# Patient Record
Sex: Female | Born: 1935
Health system: Southern US, Community
[De-identification: ages and names within clinical notes are randomized; demographics above are authoritative.]

## PROBLEM LIST (undated history)

## (undated) DIAGNOSIS — E559 Vitamin D deficiency, unspecified: Secondary | ICD-10-CM

## (undated) DIAGNOSIS — K5792 Diverticulitis of intestine, part unspecified, without perforation or abscess without bleeding: Secondary | ICD-10-CM

## (undated) DIAGNOSIS — N8110 Cystocele, unspecified: Secondary | ICD-10-CM

## (undated) DIAGNOSIS — E782 Mixed hyperlipidemia: Secondary | ICD-10-CM

## (undated) DIAGNOSIS — M810 Age-related osteoporosis without current pathological fracture: Secondary | ICD-10-CM

## (undated) DIAGNOSIS — M1990 Unspecified osteoarthritis, unspecified site: Secondary | ICD-10-CM

## (undated) DIAGNOSIS — K5732 Diverticulitis of large intestine without perforation or abscess without bleeding: Secondary | ICD-10-CM

## (undated) DIAGNOSIS — I639 Cerebral infarction, unspecified: Secondary | ICD-10-CM

## (undated) DIAGNOSIS — E079 Disorder of thyroid, unspecified: Secondary | ICD-10-CM

## (undated) DIAGNOSIS — Z800 Family history of malignant neoplasm of digestive organs: Secondary | ICD-10-CM

## (undated) DIAGNOSIS — K8020 Calculus of gallbladder without cholecystitis without obstruction: Secondary | ICD-10-CM

## (undated) DIAGNOSIS — E034 Atrophy of thyroid (acquired): Secondary | ICD-10-CM

## (undated) DIAGNOSIS — G43009 Migraine without aura, not intractable, without status migrainosus: Secondary | ICD-10-CM

## (undated) DIAGNOSIS — A498 Other bacterial infections of unspecified site: Secondary | ICD-10-CM

## (undated) DIAGNOSIS — N390 Urinary tract infection, site not specified: Secondary | ICD-10-CM

## (undated) HISTORY — DX: Calculus of gallbladder without cholecystitis without obstruction: K80.20

## (undated) HISTORY — DX: Atrophy of thyroid (acquired): E03.4

## (undated) HISTORY — PX: APPENDECTOMY: SHX54

## (undated) HISTORY — PX: ABDOMINAL HYSTERECTOMY: SHX81

## (undated) HISTORY — DX: Urinary tract infection, site not specified: N39.0

## (undated) HISTORY — DX: Mixed hyperlipidemia: E78.2

## (undated) HISTORY — DX: Migraine without aura, not intractable, without status migrainosus: G43.009

## (undated) HISTORY — DX: Family history of malignant neoplasm of digestive organs: Z80.0

## (undated) HISTORY — PX: OOPHORECTOMY: SHX86

## (undated) HISTORY — DX: Diverticulitis of large intestine without perforation or abscess without bleeding: K57.32

## (undated) HISTORY — DX: Other bacterial infections of unspecified site: A49.8

## (undated) HISTORY — DX: Age-related osteoporosis without current pathological fracture: M81.0

## (undated) HISTORY — DX: Cystocele, unspecified: N81.10

## (undated) HISTORY — PX: CATARACT EXTRACTION, BILATERAL: SHX1313

## (undated) HISTORY — DX: Vitamin D deficiency, unspecified: E55.9

## (undated) HISTORY — DX: Unspecified osteoarthritis, unspecified site: M19.90

---

## 1967-02-23 HISTORY — PX: APPENDECTOMY: SHX54

## 2009-03-25 LAB — HM COLONOSCOPY

## 2009-05-12 HISTORY — PX: COLONOSCOPY: SHX174

## 2011-03-01 DIAGNOSIS — R339 Retention of urine, unspecified: Secondary | ICD-10-CM | POA: Diagnosis not present

## 2011-03-01 DIAGNOSIS — N8110 Cystocele, unspecified: Secondary | ICD-10-CM | POA: Diagnosis not present

## 2011-04-09 DIAGNOSIS — E038 Other specified hypothyroidism: Secondary | ICD-10-CM | POA: Diagnosis not present

## 2011-04-09 DIAGNOSIS — M810 Age-related osteoporosis without current pathological fracture: Secondary | ICD-10-CM | POA: Diagnosis not present

## 2011-04-09 DIAGNOSIS — E559 Vitamin D deficiency, unspecified: Secondary | ICD-10-CM | POA: Diagnosis not present

## 2011-04-09 DIAGNOSIS — E782 Mixed hyperlipidemia: Secondary | ICD-10-CM | POA: Diagnosis not present

## 2011-04-12 DIAGNOSIS — N8110 Cystocele, unspecified: Secondary | ICD-10-CM | POA: Diagnosis not present

## 2011-04-12 DIAGNOSIS — R339 Retention of urine, unspecified: Secondary | ICD-10-CM | POA: Diagnosis not present

## 2011-06-15 DIAGNOSIS — E038 Other specified hypothyroidism: Secondary | ICD-10-CM | POA: Diagnosis not present

## 2011-07-12 DIAGNOSIS — R339 Retention of urine, unspecified: Secondary | ICD-10-CM | POA: Diagnosis not present

## 2011-07-12 DIAGNOSIS — N8110 Cystocele, unspecified: Secondary | ICD-10-CM | POA: Diagnosis not present

## 2011-10-05 DIAGNOSIS — E782 Mixed hyperlipidemia: Secondary | ICD-10-CM | POA: Diagnosis not present

## 2011-10-05 DIAGNOSIS — M810 Age-related osteoporosis without current pathological fracture: Secondary | ICD-10-CM | POA: Diagnosis not present

## 2011-10-05 DIAGNOSIS — Z1231 Encounter for screening mammogram for malignant neoplasm of breast: Secondary | ICD-10-CM | POA: Diagnosis not present

## 2011-10-05 DIAGNOSIS — E559 Vitamin D deficiency, unspecified: Secondary | ICD-10-CM | POA: Diagnosis not present

## 2011-10-05 DIAGNOSIS — E038 Other specified hypothyroidism: Secondary | ICD-10-CM | POA: Diagnosis not present

## 2011-10-18 DIAGNOSIS — N8110 Cystocele, unspecified: Secondary | ICD-10-CM | POA: Diagnosis not present

## 2011-10-18 DIAGNOSIS — R339 Retention of urine, unspecified: Secondary | ICD-10-CM | POA: Diagnosis not present

## 2011-11-08 DIAGNOSIS — Z1231 Encounter for screening mammogram for malignant neoplasm of breast: Secondary | ICD-10-CM | POA: Diagnosis not present

## 2012-01-17 DIAGNOSIS — R339 Retention of urine, unspecified: Secondary | ICD-10-CM | POA: Diagnosis not present

## 2012-01-17 DIAGNOSIS — N8110 Cystocele, unspecified: Secondary | ICD-10-CM | POA: Diagnosis not present

## 2012-01-31 DIAGNOSIS — Z23 Encounter for immunization: Secondary | ICD-10-CM | POA: Diagnosis not present

## 2012-04-24 DIAGNOSIS — R339 Retention of urine, unspecified: Secondary | ICD-10-CM | POA: Diagnosis not present

## 2012-04-24 DIAGNOSIS — N8110 Cystocele, unspecified: Secondary | ICD-10-CM | POA: Diagnosis not present

## 2012-04-26 DIAGNOSIS — E559 Vitamin D deficiency, unspecified: Secondary | ICD-10-CM | POA: Diagnosis not present

## 2012-04-26 DIAGNOSIS — E782 Mixed hyperlipidemia: Secondary | ICD-10-CM | POA: Diagnosis not present

## 2012-04-26 DIAGNOSIS — E038 Other specified hypothyroidism: Secondary | ICD-10-CM | POA: Diagnosis not present

## 2012-06-01 DIAGNOSIS — B029 Zoster without complications: Secondary | ICD-10-CM | POA: Diagnosis not present

## 2012-07-31 DIAGNOSIS — N8110 Cystocele, unspecified: Secondary | ICD-10-CM | POA: Diagnosis not present

## 2012-07-31 DIAGNOSIS — R339 Retention of urine, unspecified: Secondary | ICD-10-CM | POA: Diagnosis not present

## 2012-09-04 DIAGNOSIS — H2510 Age-related nuclear cataract, unspecified eye: Secondary | ICD-10-CM | POA: Diagnosis not present

## 2012-10-25 DIAGNOSIS — Z1231 Encounter for screening mammogram for malignant neoplasm of breast: Secondary | ICD-10-CM | POA: Diagnosis not present

## 2012-10-25 DIAGNOSIS — E038 Other specified hypothyroidism: Secondary | ICD-10-CM | POA: Diagnosis not present

## 2012-10-25 DIAGNOSIS — Z23 Encounter for immunization: Secondary | ICD-10-CM | POA: Diagnosis not present

## 2012-10-25 DIAGNOSIS — E782 Mixed hyperlipidemia: Secondary | ICD-10-CM | POA: Diagnosis not present

## 2012-11-06 DIAGNOSIS — N952 Postmenopausal atrophic vaginitis: Secondary | ICD-10-CM | POA: Diagnosis not present

## 2012-11-06 DIAGNOSIS — N8110 Cystocele, unspecified: Secondary | ICD-10-CM | POA: Diagnosis not present

## 2012-11-08 DIAGNOSIS — Z1231 Encounter for screening mammogram for malignant neoplasm of breast: Secondary | ICD-10-CM | POA: Diagnosis not present

## 2013-01-09 DIAGNOSIS — Z0000 Encounter for general adult medical examination without abnormal findings: Secondary | ICD-10-CM | POA: Diagnosis not present

## 2013-02-05 DIAGNOSIS — N8110 Cystocele, unspecified: Secondary | ICD-10-CM | POA: Diagnosis not present

## 2013-02-05 DIAGNOSIS — R339 Retention of urine, unspecified: Secondary | ICD-10-CM | POA: Diagnosis not present

## 2013-02-05 DIAGNOSIS — N952 Postmenopausal atrophic vaginitis: Secondary | ICD-10-CM | POA: Diagnosis not present

## 2013-03-22 DIAGNOSIS — R7401 Elevation of levels of liver transaminase levels: Secondary | ICD-10-CM | POA: Diagnosis not present

## 2013-04-24 DIAGNOSIS — Z111 Encounter for screening for respiratory tuberculosis: Secondary | ICD-10-CM | POA: Diagnosis not present

## 2013-05-07 DIAGNOSIS — N8110 Cystocele, unspecified: Secondary | ICD-10-CM | POA: Diagnosis not present

## 2013-05-07 DIAGNOSIS — N952 Postmenopausal atrophic vaginitis: Secondary | ICD-10-CM | POA: Diagnosis not present

## 2013-05-10 DIAGNOSIS — E782 Mixed hyperlipidemia: Secondary | ICD-10-CM | POA: Diagnosis not present

## 2013-05-10 DIAGNOSIS — E559 Vitamin D deficiency, unspecified: Secondary | ICD-10-CM | POA: Diagnosis not present

## 2013-05-10 DIAGNOSIS — E038 Other specified hypothyroidism: Secondary | ICD-10-CM | POA: Diagnosis not present

## 2013-05-10 DIAGNOSIS — M810 Age-related osteoporosis without current pathological fracture: Secondary | ICD-10-CM | POA: Diagnosis not present

## 2013-05-18 DIAGNOSIS — H5710 Ocular pain, unspecified eye: Secondary | ICD-10-CM | POA: Diagnosis not present

## 2013-08-07 DIAGNOSIS — N8110 Cystocele, unspecified: Secondary | ICD-10-CM | POA: Diagnosis not present

## 2013-08-07 DIAGNOSIS — R339 Retention of urine, unspecified: Secondary | ICD-10-CM | POA: Diagnosis not present

## 2013-11-07 DIAGNOSIS — R339 Retention of urine, unspecified: Secondary | ICD-10-CM | POA: Diagnosis not present

## 2013-11-07 DIAGNOSIS — N8110 Cystocele, unspecified: Secondary | ICD-10-CM | POA: Diagnosis not present

## 2013-11-15 DIAGNOSIS — E559 Vitamin D deficiency, unspecified: Secondary | ICD-10-CM | POA: Diagnosis not present

## 2013-11-15 DIAGNOSIS — E782 Mixed hyperlipidemia: Secondary | ICD-10-CM | POA: Diagnosis not present

## 2013-11-15 DIAGNOSIS — E038 Other specified hypothyroidism: Secondary | ICD-10-CM | POA: Diagnosis not present

## 2013-11-19 DIAGNOSIS — E559 Vitamin D deficiency, unspecified: Secondary | ICD-10-CM | POA: Diagnosis not present

## 2013-11-19 DIAGNOSIS — Z1231 Encounter for screening mammogram for malignant neoplasm of breast: Secondary | ICD-10-CM | POA: Diagnosis not present

## 2013-11-19 DIAGNOSIS — Z23 Encounter for immunization: Secondary | ICD-10-CM | POA: Diagnosis not present

## 2013-11-19 DIAGNOSIS — M810 Age-related osteoporosis without current pathological fracture: Secondary | ICD-10-CM | POA: Diagnosis not present

## 2013-11-19 DIAGNOSIS — E782 Mixed hyperlipidemia: Secondary | ICD-10-CM | POA: Diagnosis not present

## 2013-11-19 DIAGNOSIS — M25519 Pain in unspecified shoulder: Secondary | ICD-10-CM | POA: Diagnosis not present

## 2013-11-19 DIAGNOSIS — E038 Other specified hypothyroidism: Secondary | ICD-10-CM | POA: Diagnosis not present

## 2013-12-19 DIAGNOSIS — M810 Age-related osteoporosis without current pathological fracture: Secondary | ICD-10-CM | POA: Diagnosis not present

## 2013-12-19 DIAGNOSIS — Z1231 Encounter for screening mammogram for malignant neoplasm of breast: Secondary | ICD-10-CM | POA: Diagnosis not present

## 2014-02-06 DIAGNOSIS — N8110 Cystocele, unspecified: Secondary | ICD-10-CM | POA: Diagnosis not present

## 2014-02-06 DIAGNOSIS — R339 Retention of urine, unspecified: Secondary | ICD-10-CM | POA: Diagnosis not present

## 2014-02-06 DIAGNOSIS — K5900 Constipation, unspecified: Secondary | ICD-10-CM | POA: Diagnosis not present

## 2014-05-08 DIAGNOSIS — R339 Retention of urine, unspecified: Secondary | ICD-10-CM | POA: Diagnosis not present

## 2014-05-08 DIAGNOSIS — N952 Postmenopausal atrophic vaginitis: Secondary | ICD-10-CM | POA: Diagnosis not present

## 2014-05-08 DIAGNOSIS — N8110 Cystocele, unspecified: Secondary | ICD-10-CM | POA: Diagnosis not present

## 2014-05-20 DIAGNOSIS — E782 Mixed hyperlipidemia: Secondary | ICD-10-CM | POA: Diagnosis not present

## 2014-05-20 DIAGNOSIS — F5101 Primary insomnia: Secondary | ICD-10-CM | POA: Diagnosis not present

## 2014-05-20 DIAGNOSIS — E034 Atrophy of thyroid (acquired): Secondary | ICD-10-CM | POA: Diagnosis not present

## 2014-05-23 DIAGNOSIS — E875 Hyperkalemia: Secondary | ICD-10-CM | POA: Diagnosis not present

## 2014-08-20 DIAGNOSIS — N390 Urinary tract infection, site not specified: Secondary | ICD-10-CM | POA: Diagnosis not present

## 2014-08-20 DIAGNOSIS — R339 Retention of urine, unspecified: Secondary | ICD-10-CM | POA: Diagnosis not present

## 2014-08-20 DIAGNOSIS — N8110 Cystocele, unspecified: Secondary | ICD-10-CM | POA: Diagnosis not present

## 2014-08-20 DIAGNOSIS — N3090 Cystitis, unspecified without hematuria: Secondary | ICD-10-CM | POA: Diagnosis not present

## 2014-11-18 DIAGNOSIS — Z1231 Encounter for screening mammogram for malignant neoplasm of breast: Secondary | ICD-10-CM | POA: Diagnosis not present

## 2014-11-18 DIAGNOSIS — E782 Mixed hyperlipidemia: Secondary | ICD-10-CM | POA: Diagnosis not present

## 2014-11-18 DIAGNOSIS — E034 Atrophy of thyroid (acquired): Secondary | ICD-10-CM | POA: Diagnosis not present

## 2014-11-18 DIAGNOSIS — M810 Age-related osteoporosis without current pathological fracture: Secondary | ICD-10-CM | POA: Diagnosis not present

## 2014-11-18 DIAGNOSIS — Z23 Encounter for immunization: Secondary | ICD-10-CM | POA: Diagnosis not present

## 2014-11-27 DIAGNOSIS — N8110 Cystocele, unspecified: Secondary | ICD-10-CM | POA: Diagnosis not present

## 2014-11-27 DIAGNOSIS — R339 Retention of urine, unspecified: Secondary | ICD-10-CM | POA: Diagnosis not present

## 2014-12-23 DIAGNOSIS — Z1231 Encounter for screening mammogram for malignant neoplasm of breast: Secondary | ICD-10-CM | POA: Diagnosis not present

## 2014-12-26 DIAGNOSIS — Z0000 Encounter for general adult medical examination without abnormal findings: Secondary | ICD-10-CM | POA: Diagnosis not present

## 2014-12-26 DIAGNOSIS — Z6822 Body mass index (BMI) 22.0-22.9, adult: Secondary | ICD-10-CM | POA: Diagnosis not present

## 2015-03-03 DIAGNOSIS — N8110 Cystocele, unspecified: Secondary | ICD-10-CM | POA: Diagnosis not present

## 2015-03-03 DIAGNOSIS — R339 Retention of urine, unspecified: Secondary | ICD-10-CM | POA: Diagnosis not present

## 2015-05-19 DIAGNOSIS — E782 Mixed hyperlipidemia: Secondary | ICD-10-CM | POA: Diagnosis not present

## 2015-05-19 DIAGNOSIS — E034 Atrophy of thyroid (acquired): Secondary | ICD-10-CM | POA: Diagnosis not present

## 2015-05-19 DIAGNOSIS — R945 Abnormal results of liver function studies: Secondary | ICD-10-CM | POA: Diagnosis not present

## 2015-05-19 DIAGNOSIS — G43009 Migraine without aura, not intractable, without status migrainosus: Secondary | ICD-10-CM | POA: Diagnosis not present

## 2015-06-17 DIAGNOSIS — N952 Postmenopausal atrophic vaginitis: Secondary | ICD-10-CM | POA: Diagnosis not present

## 2015-06-17 DIAGNOSIS — N8110 Cystocele, unspecified: Secondary | ICD-10-CM | POA: Diagnosis not present

## 2015-07-03 DIAGNOSIS — R740 Nonspecific elevation of levels of transaminase and lactic acid dehydrogenase [LDH]: Secondary | ICD-10-CM | POA: Diagnosis not present

## 2015-09-23 DIAGNOSIS — N3289 Other specified disorders of bladder: Secondary | ICD-10-CM | POA: Diagnosis not present

## 2015-09-23 DIAGNOSIS — N952 Postmenopausal atrophic vaginitis: Secondary | ICD-10-CM | POA: Diagnosis not present

## 2015-09-23 DIAGNOSIS — N8110 Cystocele, unspecified: Secondary | ICD-10-CM | POA: Diagnosis not present

## 2015-11-18 DIAGNOSIS — Z1231 Encounter for screening mammogram for malignant neoplasm of breast: Secondary | ICD-10-CM | POA: Diagnosis not present

## 2015-11-18 DIAGNOSIS — E782 Mixed hyperlipidemia: Secondary | ICD-10-CM | POA: Diagnosis not present

## 2015-11-18 DIAGNOSIS — M810 Age-related osteoporosis without current pathological fracture: Secondary | ICD-10-CM | POA: Diagnosis not present

## 2015-11-18 DIAGNOSIS — E034 Atrophy of thyroid (acquired): Secondary | ICD-10-CM | POA: Diagnosis not present

## 2015-12-20 ENCOUNTER — Encounter (HOSPITAL_BASED_OUTPATIENT_CLINIC_OR_DEPARTMENT_OTHER): Payer: Self-pay | Admitting: Emergency Medicine

## 2015-12-20 ENCOUNTER — Emergency Department (HOSPITAL_BASED_OUTPATIENT_CLINIC_OR_DEPARTMENT_OTHER)
Admission: EM | Admit: 2015-12-20 | Discharge: 2015-12-20 | Disposition: A | Discharge status: Home / Self Care | Payer: Medicare Other | Attending: Emergency Medicine | Admitting: Emergency Medicine

## 2015-12-20 DIAGNOSIS — X58XXXA Exposure to other specified factors, initial encounter: Secondary | ICD-10-CM | POA: Diagnosis not present

## 2015-12-20 DIAGNOSIS — T18108A Unspecified foreign body in esophagus causing other injury, initial encounter: Secondary | ICD-10-CM | POA: Diagnosis present

## 2015-12-20 DIAGNOSIS — T18128A Food in esophagus causing other injury, initial encounter: Secondary | ICD-10-CM

## 2015-12-20 DIAGNOSIS — Y929 Unspecified place or not applicable: Secondary | ICD-10-CM | POA: Insufficient documentation

## 2015-12-20 DIAGNOSIS — K222 Esophageal obstruction: Secondary | ICD-10-CM | POA: Diagnosis not present

## 2015-12-20 DIAGNOSIS — Z87891 Personal history of nicotine dependence: Secondary | ICD-10-CM | POA: Insufficient documentation

## 2015-12-20 DIAGNOSIS — Y939 Activity, unspecified: Secondary | ICD-10-CM | POA: Insufficient documentation

## 2015-12-20 DIAGNOSIS — Z7982 Long term (current) use of aspirin: Secondary | ICD-10-CM | POA: Insufficient documentation

## 2015-12-20 DIAGNOSIS — Z79899 Other long term (current) drug therapy: Secondary | ICD-10-CM | POA: Diagnosis not present

## 2015-12-20 DIAGNOSIS — Y999 Unspecified external cause status: Secondary | ICD-10-CM | POA: Diagnosis not present

## 2015-12-20 DIAGNOSIS — T18120A Food in esophagus causing compression of trachea, initial encounter: Secondary | ICD-10-CM | POA: Diagnosis not present

## 2015-12-20 HISTORY — DX: Disorder of thyroid, unspecified: E07.9

## 2015-12-20 HISTORY — DX: Cerebral infarction, unspecified: I63.9

## 2015-12-20 MED ORDER — NITROGLYCERIN 0.4 MG SL SUBL
0.4000 mg | SUBLINGUAL_TABLET | Freq: Once | SUBLINGUAL | Status: AC
  Administered 2015-12-20: 0.4 mg via SUBLINGUAL
  Filled 2015-12-20: qty 1

## 2015-12-20 NOTE — ED Notes (Signed)
MD at bedside. 

## 2015-12-20 NOTE — ED Provider Notes (Signed)
[place] [facility] Provider Note   CSN: [id number] Arrival date & time: 12/20/15  0859     History   Chief Complaint Chief Complaint  Patient presents with  . Choking    HPI Claudia Rasmussen is a 82 y.o. female.  82 yo F with a chief complaint of a esophageal foreign body. Patient had steak last night about 5:30 and since then has been unable to swallow anything. Denies any shortness of breath difficulty talking. Patient has tried to drink fluids since then and has not failed to tolerate anything. She even has to stop her own saliva. His never had an issue like this before. Has never seen a gastroenterologist.   The history is provided by the patient.  Swallowed Foreign Body  This is a new problem. The current episode started yesterday. The problem occurs constantly. The problem has not changed since onset.Pertinent negatives include no chest pain, no headaches and no shortness of breath. Nothing aggravates the symptoms. Nothing relieves the symptoms. She has tried water for the symptoms. The treatment provided no relief.    Past Medical History:  Diagnosis Date  . Stroke ([place])   . Thyroid disease     There are no active problems to display for this patient.   Past Surgical History:  Procedure Laterality Date  . ABDOMINAL HYSTERECTOMY      OB History    No data available       Home Medications    Prior to Admission medications   Medication Sig Start Date End Date Taking? Authorizing Provider  alendronate (FOSAMAX) 70 MG tablet Take 70 mg by mouth once a week. Take with a full glass of water on an empty stomach.   Yes Historical Provider, MD  aspirin EC 81 MG tablet Take 81 mg by mouth daily.   Yes Historical Provider, MD  clopidogrel (PLAVIX) 75 MG tablet Take 75 mg by mouth daily.   Yes Historical Provider, MD  levothyroxine (SYNTHROID, LEVOTHROID) 75 MCG tablet Take 75 mcg by mouth daily before breakfast.   Yes Historical Provider, MD  nadolol (CORGARD)  20 MG tablet Take 20 mg by mouth daily.   Yes Historical Provider, MD  simvastatin (ZOCOR) 10 MG tablet Take 10 mg by mouth daily.   Yes Historical Provider, MD    Family History No family history on file.  Social History Social History  Substance Use Topics  . Smoking status: Former Research scientist (life sciences)  . Smokeless tobacco: Never Used  . Alcohol use Yes     Allergies   Avelox [moxifloxacin hcl in nacl]; Cephalosporins; Phenobarbital; and Zithromax [azithromycin]   Review of Systems Review of Systems  Constitutional: Negative for chills and fever.  HENT: Negative for congestion and rhinorrhea.   Eyes: Negative for redness and visual disturbance.  Respiratory: Negative for shortness of breath and wheezing.   Cardiovascular: Negative for chest pain and palpitations.  Gastrointestinal: Negative for nausea and vomiting.       Unable to swallow   Genitourinary: Negative for dysuria and urgency.  Musculoskeletal: Negative for arthralgias and myalgias.  Skin: Negative for pallor and wound.  Neurological: Negative for dizziness and headaches.     Physical Exam Updated Vital Signs BP 138/88 (BP Location: Left Arm)   Pulse 80   Temp 97.9 F (36.6 C) (Oral)   Resp 18   Ht 5\' 3"  (1.6 m)   Wt 127 lb (57.6 kg)   SpO2 94%   BMI 22.50 kg/m   Physical Exam  Constitutional:  She is oriented to person, place, and time. She appears well-developed and well-nourished. No distress.  HENT:  Head: Normocephalic and atraumatic.  Posterior nasal drip. No pooling of secretions in the oropharynx.  Eyes: EOM are normal. Pupils are equal, round, and reactive to light.  Neck: Normal range of motion. Neck supple.  Cardiovascular: Normal rate and regular rhythm.  Exam reveals no gallop and no friction rub.   No murmur heard. Pulmonary/Chest: Effort normal. She has no wheezes. She has no rales.  Abdominal: Soft. She exhibits no distension and no mass. There is no tenderness. There is no guarding.    Musculoskeletal: She exhibits no edema or tenderness.  Neurological: She is alert and oriented to person, place, and time.  Skin: Skin is warm and dry. She is not diaphoretic.  Psychiatric: She has a normal mood and affect. Her behavior is normal.  Nursing note and vitals reviewed.    ED Treatments / Results  Labs (all labs ordered are listed, but only abnormal results are displayed) Labs Reviewed - No data to display  EKG  EKG Interpretation None       Radiology No results found.  Procedures Procedures (including critical care time)  Medications Ordered in ED Medications  nitroGLYCERIN (NITROSTAT) SL tablet 0.4 mg (not administered)     Initial Impression / Assessment and Plan / ED Course  I have reviewed the triage vital signs and the nursing notes.  Pertinent labs & imaging results that were available during my care of the patient were reviewed by me and considered in my medical decision making (see chart for details).  Clinical Course    82 yo F With a chief complaint of difficulty swallowing. This been going on since yesterday. She is able to tolerate her own secretions. We'll discuss with GI.   Patient able to swallow prior to discussion with GI, requesting D/c.  Given GI follow up for possible stricture.  9:48 AM:  I have discussed the diagnosis/risks/treatment options with the patient and family and believe the pt to be eligible for discharge home to follow-up with GI. We also discussed returning to the ED immediately if new or worsening sx occur. We discussed the sx which are most concerning (e.g., inability to swallow liquids or solids, difficulty breathing) that necessitate immediate return. Medications administered to the patient during their visit and any new prescriptions provided to the patient are listed below.  Medications given during this visit Medications  nitroGLYCERIN (NITROSTAT) SL tablet 0.4 mg (0.4 mg Sublingual Given 12/20/15 0934)      The patient appears reasonably screen and/or stabilized for discharge and I doubt any other medical condition or other [facility] requiring further screening, evaluation, or treatment in the ED at this time prior to discharge.    Final Clinical Impressions(s) / ED Diagnoses   Final diagnoses:  None    New Prescriptions New Prescriptions   No medications on file     Deno Etienne, DO 12/20/15 [id number]

## 2015-12-20 NOTE — ED Triage Notes (Signed)
Pt reports she choked on a piece of steak last night and has since been unable to swallow water or her saliva. Pt feels like it is stuck in her esophagus.

## 2015-12-20 NOTE — Discharge Instructions (Signed)
Soft diet.  Return to [facility] [place] long ED if you go home and are unable to swallow.  Most often this is caused by a stricture, you may need to see GI for this.  If you continue having symptoms but can swallow liquids call the GI doctor first thing on Monday.

## 2015-12-26 DIAGNOSIS — Z1231 Encounter for screening mammogram for malignant neoplasm of breast: Secondary | ICD-10-CM | POA: Diagnosis not present

## 2016-01-01 DIAGNOSIS — R339 Retention of urine, unspecified: Secondary | ICD-10-CM | POA: Diagnosis not present

## 2016-01-01 DIAGNOSIS — N8110 Cystocele, unspecified: Secondary | ICD-10-CM | POA: Diagnosis not present

## 2016-01-01 DIAGNOSIS — N390 Urinary tract infection, site not specified: Secondary | ICD-10-CM | POA: Diagnosis not present

## 2016-01-06 DIAGNOSIS — M810 Age-related osteoporosis without current pathological fracture: Secondary | ICD-10-CM | POA: Diagnosis not present

## 2016-01-08 DIAGNOSIS — R928 Other abnormal and inconclusive findings on diagnostic imaging of breast: Secondary | ICD-10-CM | POA: Diagnosis not present

## 2016-01-08 DIAGNOSIS — N6310 Unspecified lump in the right breast, unspecified quadrant: Secondary | ICD-10-CM | POA: Diagnosis not present

## 2016-04-06 DIAGNOSIS — R339 Retention of urine, unspecified: Secondary | ICD-10-CM | POA: Diagnosis not present

## 2016-04-06 DIAGNOSIS — N8110 Cystocele, unspecified: Secondary | ICD-10-CM | POA: Diagnosis not present

## 2016-05-18 DIAGNOSIS — E782 Mixed hyperlipidemia: Secondary | ICD-10-CM | POA: Diagnosis not present

## 2016-05-18 DIAGNOSIS — E034 Atrophy of thyroid (acquired): Secondary | ICD-10-CM | POA: Diagnosis not present

## 2016-08-05 DIAGNOSIS — R339 Retention of urine, unspecified: Secondary | ICD-10-CM | POA: Diagnosis not present

## 2016-08-05 DIAGNOSIS — N8110 Cystocele, unspecified: Secondary | ICD-10-CM | POA: Diagnosis not present

## 2016-08-05 DIAGNOSIS — E782 Mixed hyperlipidemia: Secondary | ICD-10-CM | POA: Diagnosis not present

## 2016-09-10 ENCOUNTER — Other Ambulatory Visit: Payer: Self-pay

## 2016-11-16 DIAGNOSIS — E559 Vitamin D deficiency, unspecified: Secondary | ICD-10-CM | POA: Diagnosis not present

## 2016-11-16 DIAGNOSIS — E782 Mixed hyperlipidemia: Secondary | ICD-10-CM | POA: Diagnosis not present

## 2016-11-16 DIAGNOSIS — Z23 Encounter for immunization: Secondary | ICD-10-CM | POA: Diagnosis not present

## 2016-11-16 DIAGNOSIS — E034 Atrophy of thyroid (acquired): Secondary | ICD-10-CM | POA: Diagnosis not present

## 2016-11-16 DIAGNOSIS — Z1231 Encounter for screening mammogram for malignant neoplasm of breast: Secondary | ICD-10-CM | POA: Diagnosis not present

## 2016-12-06 DIAGNOSIS — R339 Retention of urine, unspecified: Secondary | ICD-10-CM | POA: Diagnosis not present

## 2016-12-06 DIAGNOSIS — N8110 Cystocele, unspecified: Secondary | ICD-10-CM | POA: Diagnosis not present

## 2017-01-05 DIAGNOSIS — M25511 Pain in right shoulder: Secondary | ICD-10-CM | POA: Diagnosis not present

## 2017-01-10 DIAGNOSIS — Z1231 Encounter for screening mammogram for malignant neoplasm of breast: Secondary | ICD-10-CM | POA: Diagnosis not present

## 2017-04-12 DIAGNOSIS — R339 Retention of urine, unspecified: Secondary | ICD-10-CM | POA: Diagnosis not present

## 2017-04-12 DIAGNOSIS — N390 Urinary tract infection, site not specified: Secondary | ICD-10-CM | POA: Diagnosis not present

## 2017-04-12 DIAGNOSIS — N8110 Cystocele, unspecified: Secondary | ICD-10-CM | POA: Diagnosis not present

## 2017-05-16 DIAGNOSIS — E782 Mixed hyperlipidemia: Secondary | ICD-10-CM | POA: Diagnosis not present

## 2017-05-16 DIAGNOSIS — E034 Atrophy of thyroid (acquired): Secondary | ICD-10-CM | POA: Diagnosis not present

## 2017-05-16 DIAGNOSIS — M810 Age-related osteoporosis without current pathological fracture: Secondary | ICD-10-CM | POA: Diagnosis not present

## 2017-05-16 DIAGNOSIS — G43009 Migraine without aura, not intractable, without status migrainosus: Secondary | ICD-10-CM | POA: Diagnosis not present

## 2017-06-27 DIAGNOSIS — A0839 Other viral enteritis: Secondary | ICD-10-CM | POA: Diagnosis not present

## 2017-06-27 DIAGNOSIS — R21 Rash and other nonspecific skin eruption: Secondary | ICD-10-CM | POA: Diagnosis not present

## 2017-06-28 DIAGNOSIS — A0839 Other viral enteritis: Secondary | ICD-10-CM | POA: Diagnosis not present

## 2017-07-26 DIAGNOSIS — H2513 Age-related nuclear cataract, bilateral: Secondary | ICD-10-CM | POA: Diagnosis not present

## 2017-07-26 DIAGNOSIS — H25043 Posterior subcapsular polar age-related cataract, bilateral: Secondary | ICD-10-CM | POA: Diagnosis not present

## 2017-07-26 DIAGNOSIS — H16223 Keratoconjunctivitis sicca, not specified as Sjogren's, bilateral: Secondary | ICD-10-CM | POA: Diagnosis not present

## 2017-07-26 DIAGNOSIS — H524 Presbyopia: Secondary | ICD-10-CM | POA: Insufficient documentation

## 2017-07-26 DIAGNOSIS — H43393 Other vitreous opacities, bilateral: Secondary | ICD-10-CM | POA: Insufficient documentation

## 2017-08-11 DIAGNOSIS — N8110 Cystocele, unspecified: Secondary | ICD-10-CM | POA: Diagnosis not present

## 2017-08-11 DIAGNOSIS — E034 Atrophy of thyroid (acquired): Secondary | ICD-10-CM | POA: Diagnosis not present

## 2017-08-11 DIAGNOSIS — R339 Retention of urine, unspecified: Secondary | ICD-10-CM | POA: Diagnosis not present

## 2017-11-16 DIAGNOSIS — M810 Age-related osteoporosis without current pathological fracture: Secondary | ICD-10-CM | POA: Diagnosis not present

## 2017-11-16 DIAGNOSIS — Z1231 Encounter for screening mammogram for malignant neoplasm of breast: Secondary | ICD-10-CM | POA: Diagnosis not present

## 2017-11-16 DIAGNOSIS — G43009 Migraine without aura, not intractable, without status migrainosus: Secondary | ICD-10-CM | POA: Diagnosis not present

## 2017-11-16 DIAGNOSIS — Z23 Encounter for immunization: Secondary | ICD-10-CM | POA: Diagnosis not present

## 2017-11-16 DIAGNOSIS — E782 Mixed hyperlipidemia: Secondary | ICD-10-CM | POA: Diagnosis not present

## 2017-11-16 DIAGNOSIS — E034 Atrophy of thyroid (acquired): Secondary | ICD-10-CM | POA: Diagnosis not present

## 2017-12-20 DIAGNOSIS — R339 Retention of urine, unspecified: Secondary | ICD-10-CM | POA: Diagnosis not present

## 2017-12-20 DIAGNOSIS — K5900 Constipation, unspecified: Secondary | ICD-10-CM | POA: Diagnosis not present

## 2017-12-20 DIAGNOSIS — N8110 Cystocele, unspecified: Secondary | ICD-10-CM | POA: Diagnosis not present

## 2017-12-20 DIAGNOSIS — N390 Urinary tract infection, site not specified: Secondary | ICD-10-CM | POA: Diagnosis not present

## 2017-12-27 DIAGNOSIS — R740 Nonspecific elevation of levels of transaminase and lactic acid dehydrogenase [LDH]: Secondary | ICD-10-CM | POA: Diagnosis not present

## 2018-02-06 DIAGNOSIS — M810 Age-related osteoporosis without current pathological fracture: Secondary | ICD-10-CM | POA: Diagnosis not present

## 2018-02-06 DIAGNOSIS — M8588 Other specified disorders of bone density and structure, other site: Secondary | ICD-10-CM | POA: Diagnosis not present

## 2018-02-06 DIAGNOSIS — Z1231 Encounter for screening mammogram for malignant neoplasm of breast: Secondary | ICD-10-CM | POA: Diagnosis not present

## 2018-03-03 DIAGNOSIS — H43393 Other vitreous opacities, bilateral: Secondary | ICD-10-CM | POA: Diagnosis not present

## 2018-03-03 DIAGNOSIS — H52203 Unspecified astigmatism, bilateral: Secondary | ICD-10-CM | POA: Diagnosis not present

## 2018-03-03 DIAGNOSIS — H25043 Posterior subcapsular polar age-related cataract, bilateral: Secondary | ICD-10-CM | POA: Diagnosis not present

## 2018-03-03 DIAGNOSIS — H2513 Age-related nuclear cataract, bilateral: Secondary | ICD-10-CM | POA: Diagnosis not present

## 2018-03-03 DIAGNOSIS — H5203 Hypermetropia, bilateral: Secondary | ICD-10-CM | POA: Diagnosis not present

## 2018-03-03 DIAGNOSIS — H5371 Glare sensitivity: Secondary | ICD-10-CM | POA: Diagnosis not present

## 2018-03-03 DIAGNOSIS — H524 Presbyopia: Secondary | ICD-10-CM | POA: Diagnosis not present

## 2018-03-03 DIAGNOSIS — H2511 Age-related nuclear cataract, right eye: Secondary | ICD-10-CM | POA: Diagnosis not present

## 2018-03-22 DIAGNOSIS — H524 Presbyopia: Secondary | ICD-10-CM | POA: Diagnosis not present

## 2018-03-22 DIAGNOSIS — H25811 Combined forms of age-related cataract, right eye: Secondary | ICD-10-CM | POA: Diagnosis not present

## 2018-03-22 DIAGNOSIS — H2511 Age-related nuclear cataract, right eye: Secondary | ICD-10-CM | POA: Diagnosis not present

## 2018-03-22 DIAGNOSIS — H25011 Cortical age-related cataract, right eye: Secondary | ICD-10-CM | POA: Diagnosis not present

## 2018-03-22 DIAGNOSIS — H52203 Unspecified astigmatism, bilateral: Secondary | ICD-10-CM | POA: Diagnosis not present

## 2018-03-30 DIAGNOSIS — H2512 Age-related nuclear cataract, left eye: Secondary | ICD-10-CM | POA: Diagnosis not present

## 2018-04-25 DIAGNOSIS — R339 Retention of urine, unspecified: Secondary | ICD-10-CM | POA: Diagnosis not present

## 2018-04-25 DIAGNOSIS — N8110 Cystocele, unspecified: Secondary | ICD-10-CM | POA: Diagnosis not present

## 2018-05-01 DIAGNOSIS — H25042 Posterior subcapsular polar age-related cataract, left eye: Secondary | ICD-10-CM | POA: Diagnosis not present

## 2018-05-01 DIAGNOSIS — H2512 Age-related nuclear cataract, left eye: Secondary | ICD-10-CM | POA: Diagnosis not present

## 2018-05-10 DIAGNOSIS — H25812 Combined forms of age-related cataract, left eye: Secondary | ICD-10-CM | POA: Diagnosis not present

## 2018-05-10 DIAGNOSIS — H2512 Age-related nuclear cataract, left eye: Secondary | ICD-10-CM | POA: Diagnosis not present

## 2018-05-10 DIAGNOSIS — Z961 Presence of intraocular lens: Secondary | ICD-10-CM | POA: Diagnosis not present

## 2018-05-11 DIAGNOSIS — K529 Noninfective gastroenteritis and colitis, unspecified: Secondary | ICD-10-CM | POA: Diagnosis present

## 2018-05-11 DIAGNOSIS — E876 Hypokalemia: Secondary | ICD-10-CM | POA: Diagnosis present

## 2018-05-11 DIAGNOSIS — I959 Hypotension, unspecified: Secondary | ICD-10-CM | POA: Diagnosis present

## 2018-05-11 DIAGNOSIS — K5792 Diverticulitis of intestine, part unspecified, without perforation or abscess without bleeding: Secondary | ICD-10-CM | POA: Diagnosis present

## 2018-05-11 DIAGNOSIS — Z888 Allergy status to other drugs, medicaments and biological substances status: Secondary | ICD-10-CM | POA: Diagnosis not present

## 2018-05-11 DIAGNOSIS — R112 Nausea with vomiting, unspecified: Secondary | ICD-10-CM | POA: Diagnosis not present

## 2018-05-11 DIAGNOSIS — E860 Dehydration: Secondary | ICD-10-CM | POA: Diagnosis not present

## 2018-05-11 DIAGNOSIS — Z91013 Allergy to seafood: Secondary | ICD-10-CM | POA: Diagnosis not present

## 2018-05-11 DIAGNOSIS — Z881 Allergy status to other antibiotic agents status: Secondary | ICD-10-CM | POA: Diagnosis not present

## 2018-05-11 DIAGNOSIS — K5190 Ulcerative colitis, unspecified, without complications: Secondary | ICD-10-CM | POA: Diagnosis not present

## 2018-05-11 DIAGNOSIS — R1111 Vomiting without nausea: Secondary | ICD-10-CM | POA: Diagnosis not present

## 2018-05-11 DIAGNOSIS — Z7902 Long term (current) use of antithrombotics/antiplatelets: Secondary | ICD-10-CM | POA: Diagnosis not present

## 2018-05-11 DIAGNOSIS — D72829 Elevated white blood cell count, unspecified: Secondary | ICD-10-CM | POA: Diagnosis not present

## 2018-05-11 DIAGNOSIS — R52 Pain, unspecified: Secondary | ICD-10-CM | POA: Diagnosis not present

## 2018-05-11 DIAGNOSIS — Z79899 Other long term (current) drug therapy: Secondary | ICD-10-CM | POA: Diagnosis not present

## 2018-05-11 DIAGNOSIS — K8020 Calculus of gallbladder without cholecystitis without obstruction: Secondary | ICD-10-CM | POA: Diagnosis not present

## 2018-05-11 DIAGNOSIS — Z9889 Other specified postprocedural states: Secondary | ICD-10-CM | POA: Diagnosis not present

## 2018-05-11 DIAGNOSIS — R935 Abnormal findings on diagnostic imaging of other abdominal regions, including retroperitoneum: Secondary | ICD-10-CM | POA: Diagnosis not present

## 2018-05-11 DIAGNOSIS — Z87891 Personal history of nicotine dependence: Secondary | ICD-10-CM | POA: Diagnosis not present

## 2018-05-11 DIAGNOSIS — G43909 Migraine, unspecified, not intractable, without status migrainosus: Secondary | ICD-10-CM | POA: Diagnosis present

## 2018-05-11 DIAGNOSIS — R1084 Generalized abdominal pain: Secondary | ICD-10-CM | POA: Diagnosis not present

## 2018-05-11 DIAGNOSIS — E039 Hypothyroidism, unspecified: Secondary | ICD-10-CM | POA: Diagnosis present

## 2018-05-11 DIAGNOSIS — K5900 Constipation, unspecified: Secondary | ICD-10-CM | POA: Diagnosis present

## 2018-05-11 DIAGNOSIS — I10 Essential (primary) hypertension: Secondary | ICD-10-CM | POA: Diagnosis present

## 2018-05-11 DIAGNOSIS — Z7982 Long term (current) use of aspirin: Secondary | ICD-10-CM | POA: Diagnosis not present

## 2018-05-17 DIAGNOSIS — K5732 Diverticulitis of large intestine without perforation or abscess without bleeding: Secondary | ICD-10-CM | POA: Diagnosis not present

## 2018-05-17 DIAGNOSIS — R600 Localized edema: Secondary | ICD-10-CM | POA: Diagnosis not present

## 2018-08-28 DIAGNOSIS — N8110 Cystocele, unspecified: Secondary | ICD-10-CM | POA: Diagnosis not present

## 2018-08-28 DIAGNOSIS — R339 Retention of urine, unspecified: Secondary | ICD-10-CM | POA: Diagnosis not present

## 2018-09-25 ENCOUNTER — Other Ambulatory Visit: Payer: Self-pay

## 2018-09-29 ENCOUNTER — Other Ambulatory Visit: Payer: Self-pay

## 2018-09-29 ENCOUNTER — Emergency Department (HOSPITAL_BASED_OUTPATIENT_CLINIC_OR_DEPARTMENT_OTHER)
Admission: EM | Admit: 2018-09-29 | Discharge: 2018-09-29 | Disposition: A | Discharge status: Home / Self Care | Payer: Medicare Other | Attending: Emergency Medicine | Admitting: Emergency Medicine

## 2018-09-29 ENCOUNTER — Encounter (HOSPITAL_BASED_OUTPATIENT_CLINIC_OR_DEPARTMENT_OTHER): Payer: Self-pay | Admitting: *Deleted

## 2018-09-29 ENCOUNTER — Emergency Department (HOSPITAL_BASED_OUTPATIENT_CLINIC_OR_DEPARTMENT_OTHER): Payer: Medicare Other

## 2018-09-29 DIAGNOSIS — K8020 Calculus of gallbladder without cholecystitis without obstruction: Secondary | ICD-10-CM | POA: Diagnosis not present

## 2018-09-29 DIAGNOSIS — Z7902 Long term (current) use of antithrombotics/antiplatelets: Secondary | ICD-10-CM | POA: Diagnosis not present

## 2018-09-29 DIAGNOSIS — R1012 Left upper quadrant pain: Secondary | ICD-10-CM | POA: Diagnosis present

## 2018-09-29 DIAGNOSIS — Z8673 Personal history of transient ischemic attack (TIA), and cerebral infarction without residual deficits: Secondary | ICD-10-CM | POA: Insufficient documentation

## 2018-09-29 DIAGNOSIS — Z7982 Long term (current) use of aspirin: Secondary | ICD-10-CM | POA: Diagnosis not present

## 2018-09-29 DIAGNOSIS — K529 Noninfective gastroenteritis and colitis, unspecified: Secondary | ICD-10-CM

## 2018-09-29 DIAGNOSIS — Z87891 Personal history of nicotine dependence: Secondary | ICD-10-CM | POA: Diagnosis not present

## 2018-09-29 DIAGNOSIS — Z79899 Other long term (current) drug therapy: Secondary | ICD-10-CM | POA: Insufficient documentation

## 2018-09-29 HISTORY — DX: Diverticulitis of intestine, part unspecified, without perforation or abscess without bleeding: K57.92

## 2018-09-29 LAB — COMPREHENSIVE METABOLIC PANEL
ALT: 24 U/L (ref 0–44)
AST: 29 U/L (ref 15–41)
Albumin: 4.1 g/dL (ref 3.5–5.0)
Alkaline Phosphatase: 79 U/L (ref 38–126)
Anion gap: 13 (ref 5–15)
BUN: 21 mg/dL (ref 8–23)
CO2: 21 mmol/L — ABNORMAL LOW (ref 22–32)
Calcium: 9.5 mg/dL (ref 8.9–10.3)
Chloride: 105 mmol/L (ref 98–111)
Creatinine, Ser: 0.93 mg/dL (ref 0.44–1.00)
GFR calc Af Amer: >60 mL/min (ref >60)
GFR calc non Af Amer: 56 mL/min — ABNORMAL LOW (ref >60)
Glucose, Bld: 126 mg/dL — ABNORMAL HIGH (ref 70–99)
Potassium: 4.2 mmol/L (ref 3.5–5.1)
Sodium: 139 mmol/L (ref 135–145)
Total Bilirubin: 1.8 mg/dL — ABNORMAL HIGH (ref 0.3–1.2)
Total Protein: 7.2 g/dL (ref 6.5–8.1)

## 2018-09-29 LAB — CBC WITH DIFFERENTIAL/PLATELET
Abs Immature Granulocytes: 0.05 10*3/uL (ref 0.00–0.07)
Basophils Absolute: 0.1 10*3/uL (ref 0.0–0.1)
Basophils Relative: 1 %
Eosinophils Absolute: 0.1 10*3/uL (ref 0.0–0.5)
Eosinophils Relative: 1 %
HCT: 43.4 % (ref 36.0–46.0)
Hemoglobin: 14.2 g/dL (ref 12.0–15.0)
Immature Granulocytes: 0 %
Lymphocytes Relative: 15 %
Lymphs Abs: 1.6 10*3/uL (ref 0.7–4.0)
MCH: 31 pg (ref 26.0–34.0)
MCHC: 32.7 g/dL (ref 30.0–36.0)
MCV: 94.8 fL (ref 80.0–100.0)
Monocytes Absolute: 0.3 10*3/uL (ref 0.1–1.0)
Monocytes Relative: 3 %
Neutro Abs: 8.9 10*3/uL — ABNORMAL HIGH (ref 1.7–7.7)
Neutrophils Relative %: 80 %
Platelets: 199 10*3/uL (ref 150–400)
RBC: 4.58 MIL/uL (ref 3.87–5.11)
RDW: 13.4 % (ref 11.5–15.5)
WBC: 11.1 10*3/uL — ABNORMAL HIGH (ref 4.0–10.5)
nRBC: 0 % (ref 0.0–0.2)

## 2018-09-29 LAB — LIPASE, BLOOD: Lipase: 38 U/L (ref 11–51)

## 2018-09-29 LAB — LACTIC ACID, PLASMA: Lactic Acid, Venous: 1.3 mmol/L (ref 0.5–1.9)

## 2018-09-29 LAB — URINALYSIS, ROUTINE W REFLEX MICROSCOPIC
Bilirubin Urine: NEGATIVE
Glucose, UA: NEGATIVE mg/dL
Ketones, ur: 15 mg/dL — AB
Nitrite: POSITIVE — AB
Protein, ur: NEGATIVE mg/dL
Specific Gravity, Urine: 1.01 (ref 1.005–1.030)
pH: 5.5 (ref 5.0–8.0)

## 2018-09-29 LAB — URINALYSIS, MICROSCOPIC (REFLEX)

## 2018-09-29 MED ORDER — SODIUM CHLORIDE 0.9 % IV BOLUS
500.0000 mL | Freq: Once | INTRAVENOUS | Status: AC
  Administered 2018-09-29: 500 mL via INTRAVENOUS

## 2018-09-29 MED ORDER — ONDANSETRON HCL 4 MG/2ML IJ SOLN
4.0000 mg | Freq: Once | INTRAMUSCULAR | Status: AC
  Administered 2018-09-29: 4 mg via INTRAVENOUS
  Filled 2018-09-29: qty 2

## 2018-09-29 MED ORDER — METRONIDAZOLE 500 MG PO TABS
500.0000 mg | ORAL_TABLET | Freq: Once | ORAL | Status: AC
  Administered 2018-09-29: 16:00:00 500 mg via ORAL
  Filled 2018-09-29: qty 1

## 2018-09-29 MED ORDER — FENTANYL CITRATE (PF) 100 MCG/2ML IJ SOLN
50.0000 ug | Freq: Once | INTRAMUSCULAR | Status: AC
Start: 2018-09-29 — End: 2018-09-29
  Administered 2018-09-29: 14:00:00 50 ug via INTRAVENOUS
  Filled 2018-09-29: qty 2

## 2018-09-29 MED ORDER — IOHEXOL 300 MG/ML  SOLN
100.0000 mL | Freq: Once | INTRAMUSCULAR | Status: AC | PRN
  Administered 2018-09-29: 100 mL via INTRAVENOUS

## 2018-09-29 MED ORDER — SODIUM CHLORIDE 0.9% FLUSH
3.0000 mL | Freq: Once | INTRAVENOUS | Status: AC
  Administered 2018-09-29: 3 mL via INTRAVENOUS
  Filled 2018-09-29: qty 3

## 2018-09-29 MED ORDER — METRONIDAZOLE 500 MG PO TABS: 500.0000 mg | ORAL_TABLET | Freq: Two times a day (BID) | ORAL | 0 refills | Status: DC

## 2018-09-29 NOTE — ED Notes (Signed)
Provided ice water for po fluid trial.

## 2018-09-29 NOTE — ED Notes (Signed)
Pt feeling much better, tolerating po fluids.  Denies nausea, denies pain.

## 2018-09-29 NOTE — ED Provider Notes (Signed)
[place] EMERGENCY DEPARTMENT Provider Note   CSN: [id number] Arrival date & time: 09/29/18  1253     History   Chief Complaint Chief Complaint  Patient presents with  . Abdominal Pain    HPI Claudia Rasmussen is a 85 y.o. female.     Patients with history of TIA on Plavix, diverticulitis requiring hospitalization, previous ovary removal due to dermoid cyst and appendectomy --presents with moderate to severe abdominal pain starting as a bloating sensation around 9:30 AM today.  Patient states it feels like previous diverticulitis but is in a different place.  Pain is worse in the left upper quadrant and then radiates across the abdomen.  Pain is worse with movement.  She had 3 different episodes of vomiting this morning.  She had a couple of solid, nonbloody bowel movements.  No urinary symptoms.  No reported fevers.  No chest pain or shortness of breath.  Onset of symptoms acute.  Course is constant.     Past Medical History:  Diagnosis Date  . Diverticulitis   . Stroke ([place])   . Thyroid disease     There are no active problems to display for this patient.   Past Surgical History:  Procedure Laterality Date  . ABDOMINAL HYSTERECTOMY       OB History   No obstetric history on file.      Home Medications    Prior to Admission medications   Medication Sig Start Date End Date Taking? Authorizing Provider  aspirin EC 81 MG tablet Take 81 mg by mouth daily.   Yes [provider]  clopidogrel (PLAVIX) 75 MG tablet Take 75 mg by mouth daily.   Yes [provider]  levothyroxine (SYNTHROID, LEVOTHROID) 75 MCG tablet Take 75 mcg by mouth daily before breakfast.   Yes [provider]  nadolol (CORGARD) 20 MG tablet Take 20 mg by mouth daily.   Yes [provider]  simvastatin (ZOCOR) 10 MG tablet Take 10 mg by mouth daily.   Yes [provider]  alendronate (FOSAMAX) 70 MG tablet Take 70 mg by mouth once a week. Take  with a full glass of water on an empty stomach.    [provider]    Family History No family history on file.  Social History Social History   Tobacco Use  . Smoking status: Former Research scientist (life sciences)  . Smokeless tobacco: Never Used  Substance Use Topics  . Alcohol use: Yes  . Drug use: Not on file     Allergies   Avelox [moxifloxacin hcl in nacl], Cephalosporins, Phenobarbital, and Zithromax [azithromycin]   Review of Systems Review of Systems  Constitutional: Negative for fever.  HENT: Negative for rhinorrhea and sore throat.   Eyes: Negative for redness.  Respiratory: Negative for cough.   Cardiovascular: Negative for chest pain.  Gastrointestinal: Positive for abdominal pain, nausea and vomiting. Negative for blood in stool, constipation and diarrhea.  Genitourinary: Negative for dysuria.  Musculoskeletal: Negative for myalgias.  Skin: Negative for rash.  Neurological: Negative for headaches.     Physical Exam Updated Vital Signs BP (!) 158/95   Pulse 62   Resp 20   Ht 5\' 3"  (1.6 m)   Wt 56.7 kg   SpO2 95%   BMI 22.14 kg/m   Physical Exam Vitals signs and nursing note reviewed.  Constitutional:      General: She is in acute distress (Appears uncomfortable).     Appearance: She is well-developed.  HENT:  Head: Normocephalic and atraumatic.  Eyes:     General:        Right eye: No discharge.        Left eye: No discharge.     Conjunctiva/sclera: Conjunctivae normal.  Neck:     Musculoskeletal: Normal range of motion and neck supple.  Cardiovascular:     Rate and Rhythm: Normal rate and regular rhythm.     Heart sounds: Normal heart sounds.  Pulmonary:     Effort: Pulmonary effort is normal.     Breath sounds: Normal breath sounds.  Abdominal:     Palpations: Abdomen is soft.     Tenderness: There is generalized abdominal tenderness (Moderate to severe abdominal pain, patient localizes to the left upper quadrant however she is quite tender with  palpation in other quadrants) and tenderness in the left upper quadrant. There is guarding. There is no rebound.     Hernia: No hernia is present.  Skin:    General: Skin is warm and dry.  Neurological:     Mental Status: She is alert.      ED Treatments / Results  Labs (all labs ordered are listed, but only abnormal results are displayed) Labs Reviewed  COMPREHENSIVE METABOLIC PANEL - Abnormal; Notable for the following components:      Result Value   CO2 21 (*)    Glucose, Bld 126 (*)    Total Bilirubin 1.8 (*)    GFR calc non Af Amer 56 (*)    All other components within normal limits  URINALYSIS, ROUTINE W REFLEX MICROSCOPIC - Abnormal; Notable for the following components:   APPearance HAZY (*)    Hgb urine dipstick LARGE (*)    Ketones, ur 15 (*)    Nitrite POSITIVE (*)    Leukocytes,Ua MODERATE (*)    All other components within normal limits  CBC WITH DIFFERENTIAL/PLATELET - Abnormal; Notable for the following components:   WBC 11.1 (*)    Neutro Abs 8.9 (*)    All other components within normal limits  URINALYSIS, MICROSCOPIC (REFLEX) - Abnormal; Notable for the following components:   Bacteria, UA MANY (*)    All other components within normal limits  LIPASE, BLOOD  LACTIC ACID, PLASMA    EKG None  Radiology Ct Abdomen Pelvis W Contrast  Result Date: 09/29/2018 CLINICAL DATA:  Left upper quadrant abdominal pain EXAM: CT ABDOMEN AND PELVIS WITH CONTRAST TECHNIQUE: Multidetector CT imaging of the abdomen and pelvis was performed using the standard protocol following bolus administration of intravenous contrast. CONTRAST:  197mL OMNIPAQUE IOHEXOL 300 MG/ML  SOLN COMPARISON:  CT 11/11/2010, 05/11/2018 FINDINGS: Lower chest: No acute abnormality. Hepatobiliary: No focal liver abnormality. There is a 1.2 cm calcified gallstone noted dependently within the gallbladder lumen. No evidence of gallbladder wall thickening or pericholecystic edema. No intrahepatic biliary  ductal dilatation. Pancreas: Unremarkable. No pancreatic ductal dilatation or surrounding inflammatory changes. Spleen: Normal in size without focal abnormality. Adrenals/Urinary Tract: Adrenal glands within normal limits. Similar appearance of low-density left-sided renal lesions, likely cysts. Mild bilateral hydronephrosis. No discrete renal or ureteral calculi identified. Urinary bladder unremarkable. Stomach/Bowel: Stomach is within normal limits. No small bowel thickening or distention. There is mild diffuse long segment colonic wall thickening involving the majority of the colon, most prominent within the descending and transverse colon. Mildly increased haziness within the right lower quadrant mesentery. Appendix not definitively identified. No evidence of perforation, pericolonic fluid, or abscess. Vascular/Lymphatic: Aortoiliac atherosclerotic calcifications. No abdominopelvic lymphadenopathy. Reproductive: Status  post hysterectomy.  Pessary device in place. Other: No abdominopelvic free fluid.  No abdominal wall hernia. Musculoskeletal: Grade 1 anterolisthesis L5 on S1. No acute osseous abnormality. IMPRESSION: 1. Mild diffuse colonic wall thickening suggesting a nonspecific colitis. There is mild increased haziness in the mesentery of the right lower quadrant. Appendix not clearly identified. 2. Cholelithiasis. Electronically Signed   By: Davina Poke M.D.   On: 09/29/2018 15:34    Procedures Procedures (including critical care time)  Medications Ordered in ED Medications  sodium chloride flush (NS) 0.9 % injection 3 mL (3 mLs Intravenous Given 09/29/18 1350)  fentaNYL (SUBLIMAZE) injection 50 mcg (50 mcg Intravenous Given 09/29/18 1348)  ondansetron (ZOFRAN) injection 4 mg (4 mg Intravenous Given 09/29/18 1348)  sodium chloride 0.9 % bolus 500 mL (0 mLs Intravenous Stopped 09/29/18 1525)  iohexol (OMNIPAQUE) 300 MG/ML solution 100 mL (100 mLs Intravenous Contrast Given 09/29/18 1456)   metroNIDAZOLE (FLAGYL) tablet 500 mg (500 mg Oral Given 09/29/18 1622)     Initial Impression / Assessment and Plan / ED Course  I have reviewed the triage vital signs and the nursing notes.  Pertinent labs & imaging results that were available during my care of the patient were reviewed by me and considered in my medical decision making (see chart for details).        Patient seen and examined.  Exam and history is not entirely consistent with diverticulitis.  I would like to rule out other etiology including bowel perforation given her significant pain.  CT ordered after initial exam.  Patient will be given fentanyl and Zofran for symptom control.  Abdominal pain labs ordered.  Do not suspect cardiac etiology at this point.  Normotensive.  She is not tachycardic but does take a beta-blocker.  Vital signs reviewed and are as follows: BP (!) 158/95   Pulse 62   Temp (!) 97.5 F (36.4 C) (Oral)   Resp 20   Ht 5\' 3"  (1.6 m)   Wt 56.7 kg   SpO2 95%   BMI 22.14 kg/m   2:11 PM pain improved after fentanyl.  Updated patient on lab results.  Things look good except for mildly elevated white blood cell count.  Awaiting CT scan.  5:10 PM patient discussed with them previously seen by Dr. Billy Fischer.  CT images personally reviewed and interpreted.    Imaging suggestive of mild colitis, no other acute findings.  Remainder of work-up is reassuring.  Patient with some findings of UTI but no symptoms of UTI.  Patient states that she did not tolerate Cipro and does not want this medication again.  She has an allergic reaction to cephalosporins which involve hives and itching.  Will start patient on oral Flagyl and have her follow-up with her doctor as she has tolerated this in the past.  She is concerned about contracting C. difficile again.  She will continue probiotics.  Patient has tolerated oral fluids and oral Flagyl in the emergency.  She is feeling well and agreeable to discharge.  The  patient was urged to return to the Emergency Department immediately with worsening of current symptoms, worsening abdominal pain, persistent vomiting, blood noted in stools, fever, or any other concerns. The patient verbalized understanding.      Final Clinical Impressions(s) / ED Diagnoses   Final diagnoses:  Colitis   Patient with abdominal pain. Vitals are stable, no fever. Labs show mild leukocytosis, otherwise reassuring. Imaging demonstrates mild colitis without other abnormality. No signs of dehydration, patient  is tolerating PO's. Lungs are clear and no signs suggestive of PNA. Low concern for appendicitis, cholecystitis, pancreatitis, ruptured viscus, UTI (no urinary sx today), kidney stone, aortic dissection, aortic aneurysm or other emergent abdominal etiology. Supportive therapy indicated with return if symptoms worsen.    ED Discharge Orders         Ordered    metroNIDAZOLE (FLAGYL) 500 MG tablet  2 times daily     09/29/18 1704           Carlisle Cater, [name] 09/29/18 1713    Gareth Morgan, MD 09/30/18 1156

## 2018-09-29 NOTE — Discharge Instructions (Signed)
Please read and follow all provided instructions.  Your diagnoses today include:  1. Colitis     Tests performed today include:  Blood counts and electrolytes  Blood tests to check liver and kidney function  Blood tests to check pancreas function  Urine test to look for infection  CT of your abdomen and pelvis -  shows mild colitis without other problems   Vital signs. See below for your results today.   Medications prescribed:   Metronidazole - antibiotic  You have been prescribed an antibiotic medicine: take the entire course of medicine even if you are feeling better. Stopping early can cause the antibiotic not to work. Do not drink alcohol when taking this medication.   Take any prescribed medications only as directed.  Home care instructions:   Follow any educational materials contained in this packet.  Follow-up instructions: Please follow-up with your primary care provider in the next 3 days for further evaluation of your symptoms.    Return instructions:  SEEK IMMEDIATE MEDICAL ATTENTION IF:  The pain does not go away or becomes severe   A temperature above 101F develops   Repeated vomiting occurs (multiple episodes)   The pain becomes localized to portions of the abdomen. The right side could possibly be appendicitis. In an adult, the left lower portion of the abdomen could be colitis or diverticulitis.   Blood is being passed in stools or vomit (bright red or black tarry stools)   You develop chest pain, difficulty breathing, dizziness or fainting, or become confused, poorly responsive, or inconsolable (young children)  If you have any other emergent concerns regarding your health  Additional Information: Abdominal (belly) pain can be caused by many things. Your caregiver performed an examination and possibly ordered blood/urine tests and imaging (CT scan, x-rays, ultrasound). Many cases can be observed and treated at home after initial evaluation in the  emergency department. Even though you are being discharged home, abdominal pain can be unpredictable. Therefore, you need a repeated exam if your pain does not resolve, returns, or worsens. Most patients with abdominal pain don't have to be admitted to the hospital or have surgery, but serious problems like appendicitis and gallbladder attacks can start out as nonspecific pain. Many abdominal conditions cannot be diagnosed in one visit, so follow-up evaluations are very important.  Your vital signs today were: BP 133/70 (BP Location: Right Arm)    Pulse 78    Temp 98 F (36.7 C) (Oral)    Resp 16    Ht 5\' 3"  (1.6 m)    Wt 56.7 kg    SpO2 99%    BMI 22.14 kg/m  If your blood pressure (bp) was elevated above 135/85 this visit, please have this repeated by your doctor within one month. --------------

## 2018-09-29 NOTE — ED Triage Notes (Signed)
Abdominal pain since this am. She vomited x 3. No diarrhea.

## 2018-10-11 DIAGNOSIS — J449 Chronic obstructive pulmonary disease, unspecified: Secondary | ICD-10-CM | POA: Diagnosis not present

## 2018-10-11 DIAGNOSIS — Z96652 Presence of left artificial knee joint: Secondary | ICD-10-CM | POA: Diagnosis not present

## 2018-10-11 DIAGNOSIS — M6281 Muscle weakness (generalized): Secondary | ICD-10-CM | POA: Diagnosis not present

## 2018-10-11 DIAGNOSIS — R2689 Other abnormalities of gait and mobility: Secondary | ICD-10-CM | POA: Diagnosis not present

## 2018-10-11 DIAGNOSIS — R2681 Unsteadiness on feet: Secondary | ICD-10-CM | POA: Diagnosis not present

## 2018-10-11 DIAGNOSIS — Z471 Aftercare following joint replacement surgery: Secondary | ICD-10-CM | POA: Diagnosis not present

## 2018-10-12 DIAGNOSIS — Z471 Aftercare following joint replacement surgery: Secondary | ICD-10-CM | POA: Diagnosis not present

## 2018-10-12 DIAGNOSIS — M6281 Muscle weakness (generalized): Secondary | ICD-10-CM | POA: Diagnosis not present

## 2018-10-12 DIAGNOSIS — R2681 Unsteadiness on feet: Secondary | ICD-10-CM | POA: Diagnosis not present

## 2018-10-12 DIAGNOSIS — J449 Chronic obstructive pulmonary disease, unspecified: Secondary | ICD-10-CM | POA: Diagnosis not present

## 2018-10-12 DIAGNOSIS — Z96652 Presence of left artificial knee joint: Secondary | ICD-10-CM | POA: Diagnosis not present

## 2018-10-12 DIAGNOSIS — R2689 Other abnormalities of gait and mobility: Secondary | ICD-10-CM | POA: Diagnosis not present

## 2018-10-13 DIAGNOSIS — J449 Chronic obstructive pulmonary disease, unspecified: Secondary | ICD-10-CM | POA: Diagnosis not present

## 2018-10-13 DIAGNOSIS — M6281 Muscle weakness (generalized): Secondary | ICD-10-CM | POA: Diagnosis not present

## 2018-10-13 DIAGNOSIS — Z471 Aftercare following joint replacement surgery: Secondary | ICD-10-CM | POA: Diagnosis not present

## 2018-10-13 DIAGNOSIS — R2681 Unsteadiness on feet: Secondary | ICD-10-CM | POA: Diagnosis not present

## 2018-10-13 DIAGNOSIS — Z96652 Presence of left artificial knee joint: Secondary | ICD-10-CM | POA: Diagnosis not present

## 2018-10-13 DIAGNOSIS — R2689 Other abnormalities of gait and mobility: Secondary | ICD-10-CM | POA: Diagnosis not present

## 2018-10-16 DIAGNOSIS — Z96652 Presence of left artificial knee joint: Secondary | ICD-10-CM | POA: Diagnosis not present

## 2018-10-16 DIAGNOSIS — R2689 Other abnormalities of gait and mobility: Secondary | ICD-10-CM | POA: Diagnosis not present

## 2018-10-16 DIAGNOSIS — R2681 Unsteadiness on feet: Secondary | ICD-10-CM | POA: Diagnosis not present

## 2018-10-16 DIAGNOSIS — Z471 Aftercare following joint replacement surgery: Secondary | ICD-10-CM | POA: Diagnosis not present

## 2018-10-16 DIAGNOSIS — M6281 Muscle weakness (generalized): Secondary | ICD-10-CM | POA: Diagnosis not present

## 2018-10-16 DIAGNOSIS — J449 Chronic obstructive pulmonary disease, unspecified: Secondary | ICD-10-CM | POA: Diagnosis not present

## 2018-10-17 DIAGNOSIS — Z96652 Presence of left artificial knee joint: Secondary | ICD-10-CM | POA: Diagnosis not present

## 2018-10-17 DIAGNOSIS — R2681 Unsteadiness on feet: Secondary | ICD-10-CM | POA: Diagnosis not present

## 2018-10-17 DIAGNOSIS — Z471 Aftercare following joint replacement surgery: Secondary | ICD-10-CM | POA: Diagnosis not present

## 2018-10-17 DIAGNOSIS — M6281 Muscle weakness (generalized): Secondary | ICD-10-CM | POA: Diagnosis not present

## 2018-10-17 DIAGNOSIS — J449 Chronic obstructive pulmonary disease, unspecified: Secondary | ICD-10-CM | POA: Diagnosis not present

## 2018-10-17 DIAGNOSIS — R2689 Other abnormalities of gait and mobility: Secondary | ICD-10-CM | POA: Diagnosis not present

## 2018-10-18 DIAGNOSIS — M6281 Muscle weakness (generalized): Secondary | ICD-10-CM | POA: Diagnosis not present

## 2018-10-18 DIAGNOSIS — Z96652 Presence of left artificial knee joint: Secondary | ICD-10-CM | POA: Diagnosis not present

## 2018-10-18 DIAGNOSIS — R2689 Other abnormalities of gait and mobility: Secondary | ICD-10-CM | POA: Diagnosis not present

## 2018-10-18 DIAGNOSIS — R2681 Unsteadiness on feet: Secondary | ICD-10-CM | POA: Diagnosis not present

## 2018-10-18 DIAGNOSIS — Z471 Aftercare following joint replacement surgery: Secondary | ICD-10-CM | POA: Diagnosis not present

## 2018-10-18 DIAGNOSIS — J449 Chronic obstructive pulmonary disease, unspecified: Secondary | ICD-10-CM | POA: Diagnosis not present

## 2018-10-20 DIAGNOSIS — R2689 Other abnormalities of gait and mobility: Secondary | ICD-10-CM | POA: Diagnosis not present

## 2018-10-20 DIAGNOSIS — M6281 Muscle weakness (generalized): Secondary | ICD-10-CM | POA: Diagnosis not present

## 2018-10-20 DIAGNOSIS — Z471 Aftercare following joint replacement surgery: Secondary | ICD-10-CM | POA: Diagnosis not present

## 2018-10-20 DIAGNOSIS — Z96652 Presence of left artificial knee joint: Secondary | ICD-10-CM | POA: Diagnosis not present

## 2018-10-20 DIAGNOSIS — R2681 Unsteadiness on feet: Secondary | ICD-10-CM | POA: Diagnosis not present

## 2018-10-20 DIAGNOSIS — J449 Chronic obstructive pulmonary disease, unspecified: Secondary | ICD-10-CM | POA: Diagnosis not present

## 2018-10-24 DIAGNOSIS — M84375A Stress fracture, left foot, initial encounter for fracture: Secondary | ICD-10-CM | POA: Diagnosis not present

## 2018-10-31 DIAGNOSIS — M84375D Stress fracture, left foot, subsequent encounter for fracture with routine healing: Secondary | ICD-10-CM | POA: Diagnosis not present

## 2018-11-14 DIAGNOSIS — M84375D Stress fracture, left foot, subsequent encounter for fracture with routine healing: Secondary | ICD-10-CM | POA: Diagnosis not present

## 2018-11-24 DIAGNOSIS — M84375D Stress fracture, left foot, subsequent encounter for fracture with routine healing: Secondary | ICD-10-CM | POA: Diagnosis not present

## 2018-12-11 DIAGNOSIS — H524 Presbyopia: Secondary | ICD-10-CM | POA: Diagnosis not present

## 2018-12-11 DIAGNOSIS — H16223 Keratoconjunctivitis sicca, not specified as Sjogren's, bilateral: Secondary | ICD-10-CM | POA: Diagnosis not present

## 2018-12-11 DIAGNOSIS — H5203 Hypermetropia, bilateral: Secondary | ICD-10-CM | POA: Diagnosis not present

## 2018-12-11 DIAGNOSIS — H52203 Unspecified astigmatism, bilateral: Secondary | ICD-10-CM | POA: Diagnosis not present

## 2018-12-14 DIAGNOSIS — E034 Atrophy of thyroid (acquired): Secondary | ICD-10-CM | POA: Diagnosis not present

## 2018-12-14 DIAGNOSIS — Z0000 Encounter for general adult medical examination without abnormal findings: Secondary | ICD-10-CM | POA: Diagnosis not present

## 2018-12-14 DIAGNOSIS — Z23 Encounter for immunization: Secondary | ICD-10-CM | POA: Diagnosis not present

## 2018-12-14 DIAGNOSIS — Z6823 Body mass index (BMI) 23.0-23.9, adult: Secondary | ICD-10-CM | POA: Diagnosis not present

## 2018-12-14 DIAGNOSIS — Z1231 Encounter for screening mammogram for malignant neoplasm of breast: Secondary | ICD-10-CM | POA: Diagnosis not present

## 2018-12-14 DIAGNOSIS — E782 Mixed hyperlipidemia: Secondary | ICD-10-CM | POA: Diagnosis not present

## 2018-12-29 DIAGNOSIS — M810 Age-related osteoporosis without current pathological fracture: Secondary | ICD-10-CM | POA: Diagnosis not present

## 2019-01-02 DIAGNOSIS — N8110 Cystocele, unspecified: Secondary | ICD-10-CM | POA: Diagnosis not present

## 2019-01-02 DIAGNOSIS — R339 Retention of urine, unspecified: Secondary | ICD-10-CM | POA: Diagnosis not present

## 2019-01-02 DIAGNOSIS — N390 Urinary tract infection, site not specified: Secondary | ICD-10-CM | POA: Diagnosis not present

## 2019-01-08 DIAGNOSIS — M1611 Unilateral primary osteoarthritis, right hip: Secondary | ICD-10-CM | POA: Diagnosis not present

## 2019-01-08 DIAGNOSIS — M25551 Pain in right hip: Secondary | ICD-10-CM | POA: Diagnosis not present

## 2019-01-08 DIAGNOSIS — N3001 Acute cystitis with hematuria: Secondary | ICD-10-CM | POA: Diagnosis not present

## 2019-02-20 DIAGNOSIS — Z23 Encounter for immunization: Secondary | ICD-10-CM | POA: Diagnosis not present

## 2019-02-23 DIAGNOSIS — H01009 Unspecified blepharitis unspecified eye, unspecified eyelid: Secondary | ICD-10-CM

## 2019-02-23 HISTORY — DX: Unspecified blepharitis unspecified eye, unspecified eyelid: H01.009

## 2019-03-20 DIAGNOSIS — Z23 Encounter for immunization: Secondary | ICD-10-CM | POA: Diagnosis not present

## 2019-04-17 DIAGNOSIS — Z1231 Encounter for screening mammogram for malignant neoplasm of breast: Secondary | ICD-10-CM | POA: Diagnosis not present

## 2019-04-17 LAB — HM MAMMOGRAPHY: HM Mammogram: NORMAL (ref 0–4)

## 2019-04-19 ENCOUNTER — Encounter: Payer: Self-pay | Admitting: Physician Assistant

## 2019-05-02 DIAGNOSIS — N8110 Cystocele, unspecified: Secondary | ICD-10-CM | POA: Diagnosis not present

## 2019-05-02 DIAGNOSIS — R339 Retention of urine, unspecified: Secondary | ICD-10-CM | POA: Diagnosis not present

## 2019-05-02 DIAGNOSIS — N390 Urinary tract infection, site not specified: Secondary | ICD-10-CM | POA: Diagnosis not present

## 2019-06-11 ENCOUNTER — Other Ambulatory Visit: Payer: Self-pay | Admitting: Physician Assistant

## 2019-06-12 ENCOUNTER — Ambulatory Visit: Payer: TRICARE For Life (TFL) | Admitting: Physician Assistant

## 2019-06-14 DIAGNOSIS — H0100A Unspecified blepharitis right eye, upper and lower eyelids: Secondary | ICD-10-CM | POA: Diagnosis not present

## 2019-06-14 DIAGNOSIS — H16223 Keratoconjunctivitis sicca, not specified as Sjogren's, bilateral: Secondary | ICD-10-CM | POA: Diagnosis not present

## 2019-06-14 DIAGNOSIS — H18593 Other hereditary corneal dystrophies, bilateral: Secondary | ICD-10-CM | POA: Diagnosis not present

## 2019-06-14 DIAGNOSIS — Z961 Presence of intraocular lens: Secondary | ICD-10-CM | POA: Diagnosis not present

## 2019-06-14 DIAGNOSIS — H43393 Other vitreous opacities, bilateral: Secondary | ICD-10-CM | POA: Diagnosis not present

## 2019-06-14 DIAGNOSIS — H0100B Unspecified blepharitis left eye, upper and lower eyelids: Secondary | ICD-10-CM | POA: Diagnosis not present

## 2019-06-15 ENCOUNTER — Ambulatory Visit: Payer: TRICARE For Life (TFL) | Admitting: Physician Assistant

## 2019-06-19 ENCOUNTER — Encounter: Payer: Self-pay | Admitting: Physician Assistant

## 2019-06-19 ENCOUNTER — Other Ambulatory Visit: Payer: Self-pay

## 2019-06-19 ENCOUNTER — Ambulatory Visit (INDEPENDENT_AMBULATORY_CARE_PROVIDER_SITE_OTHER): Payer: Medicare Other | Admitting: Physician Assistant

## 2019-06-19 VITALS — BP 108/68 | HR 84 | Temp 97.8°F | Resp 16 | Wt 129.0 lb

## 2019-06-19 DIAGNOSIS — M25552 Pain in left hip: Secondary | ICD-10-CM | POA: Insufficient documentation

## 2019-06-19 DIAGNOSIS — M818 Other osteoporosis without current pathological fracture: Secondary | ICD-10-CM | POA: Diagnosis not present

## 2019-06-19 DIAGNOSIS — E038 Other specified hypothyroidism: Secondary | ICD-10-CM | POA: Insufficient documentation

## 2019-06-19 DIAGNOSIS — E559 Vitamin D deficiency, unspecified: Secondary | ICD-10-CM | POA: Diagnosis not present

## 2019-06-19 DIAGNOSIS — E782 Mixed hyperlipidemia: Secondary | ICD-10-CM | POA: Diagnosis not present

## 2019-06-19 NOTE — Assessment & Plan Note (Signed)
Left hip xray rom exercises

## 2019-06-19 NOTE — Assessment & Plan Note (Signed)
Continue current med labwork pending

## 2019-06-19 NOTE — Patient Instructions (Signed)
Hip Bursitis Rehab Ask your health care provider which exercises are safe for you. Do exercises exactly as told by your health care provider and adjust them as directed. It is normal to feel mild stretching, pulling, tightness, or discomfort as you do these exercises. Stop right away if you feel sudden pain or your pain gets worse. Do not begin these exercises until told by your health care provider. Stretching exercise This exercise warms up your muscles and joints and improves the movement and flexibility of your hip. This exercise also helps to relieve pain and stiffness. Iliotibial band stretch An iliotibial band is a strong band of muscle tissue that runs from the outer side of your hip to the outer side of your thigh and knee. 1. Lie on your side with your left / right leg in the top position. 2. Bend your left / right knee and grab your ankle. Stretch out your bottom arm to help you balance. 3. Slowly bring your knee back so your thigh is behind your body. 4. Slowly lower your knee toward the floor until you feel a gentle stretch on the outside of your left / right thigh. If you do not feel a stretch and your knee will not fall farther, place the heel of your other foot on top of your knee and pull your knee down toward the floor with your foot. 5. Hold this position for __________ seconds. 6. Slowly return to the starting position. Repeat __________ times. Complete this exercise __________ times a day. Strengthening exercises These exercises build strength and endurance in your hip and pelvis. Endurance is the ability to use your muscles for a long time, even after they get tired. Bridge This exercise strengthens the muscles that move your thigh backward (hip extensors). 1. Lie on your back on a firm surface with your knees bent and your feet flat on the floor. 2. Tighten your buttocks muscles and lift your buttocks off the floor until your trunk is level with your thighs. ? Do not arch  your back. ? You should feel the muscles working in your buttocks and the back of your thighs. If you do not feel these muscles, slide your feet 1-2 inches (2.5-5 cm) farther away from your buttocks. ? If this exercise is too easy, try doing it with your arms crossed over your chest. 3. Hold this position for __________ seconds. 4. Slowly lower your hips to the starting position. 5. Let your muscles relax completely after each repetition. Repeat __________ times. Complete this exercise __________ times a day. Squats This exercise strengthens the muscles in front of your thigh and knee (quadriceps). 1. Stand in front of a table, with your feet and knees pointing straight ahead. You may rest your hands on the table for balance but not for support. 2. Slowly bend your knees and lower your hips like you are going to sit in a chair. ? Keep your weight over your heels, not over your toes. ? Keep your lower legs upright so they are parallel with the table legs. ? Do not let your hips go lower than your knees. ? Do not bend lower than told by your health care provider. ? If your hip pain increases, do not bend as low. 3. Hold the squat position for __________ seconds. 4. Slowly push with your legs to return to standing. Do not use your hands to pull yourself to standing. Repeat __________ times. Complete this exercise __________ times a day. Hip hike 1. Stand   sideways on a bottom step. Stand on your left / right leg with your other foot unsupported next to the step. You can hold on to the railing or wall for balance if needed. 2. Keep your knees straight and your torso square. Then lift your left / right hip up toward the ceiling. 3. Hold this position for __________ seconds. 4. Slowly let your left / right hip lower toward the floor, past the starting position. Your foot should get closer to the floor. Do not lean or bend your knees. Repeat __________ times. Complete this exercise __________ times a  day. Single leg stand 1. Without shoes, stand near a railing or in a doorway. You may hold on to the railing or door frame as needed for balance. 2. Squeeze your left / right buttock muscles, then lift up your other foot. ? Do not let your left / right hip push out to the side. ? It is helpful to stand in front of a mirror for this exercise so you can watch your hip. 3. Hold this position for __________ seconds. Repeat __________ times. Complete this exercise __________ times a day. This information is not intended to replace advice given to you by your health care provider. Make sure you discuss any questions you have with your health care provider. Document Revised: 06/05/2018 Document Reviewed: 06/05/2018 Elsevier Patient Education  2020 [employer]  

## 2019-06-19 NOTE — Assessment & Plan Note (Signed)
Well controlled.  ?No changes to medicines.  ?Continue to work on eating a healthy diet and exercise.  ?Labs drawn today.  ?

## 2019-06-19 NOTE — Assessment & Plan Note (Signed)
Pt will consider next injection .Marland KitchenMarland KitchenMarland Kitchen

## 2019-06-19 NOTE — Progress Notes (Signed)
Established Patient Office Visit  Subjective:  Patient ID: Claudia Rasmussen, female    DOB: Sep 17, 1931  Age: 86 y.o. MRN: [id number]  CC:  Chief Complaint  Patient presents with  . Hypothyroidism  . Hyperlipidemia    HPI Claudia Rasmussen presents for follow up hypothyroidism  Claudia Rasmussen presents with atrophy of thyroid (acquired).  Date of diagnosis 2002.  She is currently taking Synthroid, 75 mcg daily.  TSH was last checked 6 months ago.  The result was reported as normal .  She denies related symptoms such as cold intolerance, constipation, fatigue, myalgias, weakness and weight gain.  She reports no symptoms suggestive of adverse medication effect.      Pt presents with hyperlipidemia.  Date of diagnosis 09/2009.  Current treatment includes simvastatin 10mg  qd  Compliance with treatment has been good; she takes her medication as directed, maintains her low cholesterol diet, follows up as directed, and maintains her exercise regimen.  She denies experiencing any hypercholesterolemia related symptoms  Dx with age-related osteoporosis without current pathological fracture; this has been a problem for the past several years.  She describes the intensity of arthralgias as moderate.  Tests done to help confirm the diagnosis and evaluate for treatable causes include bone densinometry test ( decreased bone mass ).  Compliance with treatment has been good; Claudia Rasmussen takes her medication as directed, maintains her diet and exercise regimen, and follows up as directed.  Current treatment includes exercise and Claudia Rasmussen presents with atrophy of thyroid (acquired).  Date of diagnosis 2002.  She is currently taking Synthroid, 88 mcg daily.  TSH was last checked 6 months ago.  The result was reported as normal ( 1.830 mU/L ).  She denies related symptoms such as cold intolerance, constipation, fatigue, myalgias, weakness and weight gain.  She reports no symptoms suggestive of adverse medication effect.      Pt  presents with hyperlipidemia.  Date of diagnosis 09/2009.  Current treatment includes Zocor.  Compliance with treatment has been good; she takes her medication as directed, maintains her low cholesterol diet, follows up as directed, and maintains her exercise regimen.  She denies experiencing any hypercholesterolemia related symptoms.  Most recent lab tests include total cholesterol level ( fasting 142 mg/dl on 05/16/2017 ), triglycerides ( 62 mg/dl ), HDL ( 58 mg/dL ), and LDL cholesterol ( 72 mg/dl ).  Concurrent health problems include hypothyroidism.      Dx with age-related osteoporosis without current pathological fracture; this has been a problem for the past several years.  She describes the intensity of arthralgias as moderate.  Tests done to help confirm the diagnosis and evaluate for treatable causes include bone densinometry test ( decreased bone mass ).  Compliance with treatment has been good; Claudia Rasmussen takes her medication as directed, maintains her diet and exercise regimen, and follows up as directed.  Current treatment includes exercise and Prolia injections - She is concerned about getting her next Prolia shot because she thinks it caused bilateral hip pain with her past injection   Pt states that after having her Prolia shot in November she began having right hip pain and was told she had arthritis in her hip .  Xray was done - - she states several days later her left hip 'gave way' and even had bruising in her groin after this happened - states she still has soreness and stiffness in the left hip esp when she first stands --- has not had imaging of that hip  Concerning migraine without aura, not intractable, without status migrainosus, Claudia Rasmussen was diagnosed with migraine headaches several years ago. She states that she has been off nadolol for over 6 months and has not had any recurrent migraines      .  Past Medical History:  Diagnosis Date  . Age-related osteoporosis without current  pathological fracture   . Atrophy of thyroid (acquired)   . Diverticulitis   . Diverticulitis of large intestine without perforation or abscess without bleeding   . Migraine without aura, not intractable, without status migrainosus   . Mixed hyperlipidemia   . Stroke ([place])   . Thyroid disease   . Vitamin D deficiency, unspecified     Past Surgical History:  Procedure Laterality Date  . ABDOMINAL HYSTERECTOMY    . APPENDECTOMY    . CATARACT EXTRACTION, BILATERAL Bilateral    right- 03/22/2018 and left 05/10/2018    Family History  Problem Relation Age of Onset  . Breast cancer Mother   . Colon cancer Father   . Prostate cancer Brother   . Coronary artery disease Other   . Hypertension Other     Social History   Socioeconomic History  . Marital status: Widowed    Spouse name: Not on file  . Number of children: 3  . Years of education: Not on file  . Highest education level: Not on file  Occupational History  . Not on file  Tobacco Use  . Smoking status: Former Research scientist (life sciences)  . Smokeless tobacco: Never Used  Substance and Sexual Activity  . Alcohol use: Yes    Comment: Drinks alcohol very infrequently. She typically consumes white wine.  . Drug use: Never  . Sexual activity: Not on file  Other Topics Concern  . Not on file  Social History Narrative  . Not on file   Social Determinants of Health   Financial Resource Strain:   . Difficulty of Paying Living Expenses:   Food Insecurity:   . Worried About Charity fundraiser in the Last Year:   . Arboriculturist in the Last Year:   Transportation Needs:   . Film/video editor (Medical):   Marland Kitchen Lack of Transportation (Non-Medical):   Physical Activity:   . Days of Exercise per Week:   . Minutes of Exercise per Session:   Stress:   . Feeling of Stress :   Social Connections:   . Frequency of Communication with Friends and Family:   . Frequency of Social Gatherings with Friends and Family:   . Attends Religious  Services:   . Active Member of Clubs or Organizations:   . Attends Archivist Meetings:   Marland Kitchen Marital Status:   Intimate Partner Violence:   . Fear of Current or Ex-Partner:   . Emotionally Abused:   Marland Kitchen Physically Abused:   . Sexually Abused:      Current Outpatient Medications:  .  aspirin EC 81 MG tablet, Take 81 mg by mouth daily., Disp: , Rfl:  .  bethanechol (URECHOLINE) 25 MG tablet, Take 25 mg by mouth 2 (two) times daily., Disp: , Rfl:  .  cetirizine (ZYRTEC) 10 MG tablet, Take 10 mg by mouth daily., Disp: , Rfl:  .  Cholecalciferol (VITAMIN D3) 25 MCG (1000 UT) CAPS, Take 1,000 Units by mouth daily., Disp: , Rfl:  .  clopidogrel (PLAVIX) 75 MG tablet, Take 75 mg by mouth daily., Disp: , Rfl:  .  furosemide (LASIX) 20 MG tablet, Take 20 mg  by mouth daily as needed (Swelling)., Disp: , Rfl:  .  nadolol (CORGARD) 20 MG tablet, Take 20 mg by mouth daily., Disp: , Rfl:  .  simvastatin (ZOCOR) 10 MG tablet, Take 10 mg by mouth daily., Disp: , Rfl:  .  SYNTHROID 75 MCG tablet, TAKE 1 TABLET DAILY, Disp: 90 tablet, Rfl: 3   Allergies  Allergen Reactions  . Shellfish Allergy Other (See Comments)    Itching, Hives  . Avelox [Moxifloxacin Hcl In Nacl]   . Cephalosporins Other (See Comments)    Itching   . Levaquin [Levofloxacin]   . Azithromycin Rash  . Moxifloxacin Hcl Rash  . Phenobarbital Rash    ROS CONSTITUTIONAL: Negative for chills, fatigue, fever, unintentional weight gain and unintentional weight loss.  E/N/T: Negative for ear pain, nasal congestion and sore throat.  CARDIOVASCULAR: Negative for chest pain, dizziness, palpitations and pedal edema.  RESPIRATORY: Negative for recent cough and dyspnea.  GASTROINTESTINAL: Negative for abdominal pain, acid reflux symptoms, constipation, diarrhea, nausea and vomiting.  MSK: see HPI INTEGUMENTARY: Negative for rash.  NEUROLOGICAL: Negative for dizziness and headaches.  PSYCHIATRIC: Negative for sleep disturbance  and to question depression screen.  Negative for depression, negative for anhedonia.        Objective:    PHYSICAL EXAM:   VS: BP 108/68   Pulse 84   Temp 97.8 F (36.6 C)   Resp 16   Wt 129 lb (58.5 kg)   BMI 22.85 kg/m   GEN: Well nourished, well developed, in no acute distress   Neck: no JVD or masses - no thyromegaly Cardiac: RRR; no murmurs, rubs, or gallops,no edema - no significant varicosities Respiratory:  normal respiratory rate and pattern with no distress - normal breath sounds with no rales, rhonchi, wheezes or rubs GI: normal bowel sounds, no masses or tenderness MS: no deformity or atrophy - normal ROM of hip Skin: warm and dry, no rash  Neuro:  Alert and Oriented x 3, Strength and sensation are intact - CN II-Xii grossly intact Psych: euthymic mood, appropriate affect and demeanor  BP 108/68   Pulse 84   Temp 97.8 F (36.6 C)   Resp 16   Wt 129 lb (58.5 kg)   BMI 22.85 kg/m  Wt Readings from Last 3 Encounters:  06/19/19 129 lb (58.5 kg)  09/29/18 125 lb (56.7 kg)  12/20/15 127 lb (57.6 kg)     Health Maintenance Due  Topic Date Due  . TETANUS/TDAP  Never done    There are no preventive care reminders to display for this patient.  No results found for: TSH Lab Results  Component Value Date   WBC 11.1 (H) 09/29/2018   HGB 14.2 09/29/2018   HCT 43.4 09/29/2018   MCV 94.8 09/29/2018   PLT 199 09/29/2018   Lab Results  Component Value Date   NA 139 09/29/2018   K 4.2 09/29/2018   CO2 21 (L) 09/29/2018   GLUCOSE 126 (H) 09/29/2018   BUN 21 09/29/2018   CREATININE 0.93 09/29/2018   BILITOT 1.8 (H) 09/29/2018   ALKPHOS 79 09/29/2018   AST 29 09/29/2018   ALT 24 09/29/2018   PROT 7.2 09/29/2018   ALBUMIN 4.1 09/29/2018   CALCIUM 9.5 09/29/2018   ANIONGAP 13 09/29/2018   No results found for: CHOL No results found for: HDL No results found for: LDLCALC No results found for: TRIG No results found for: CHOLHDL No results found for:  HGBA1C    Assessment &  Plan:   Problem List Items Addressed This Visit    None    Visit Diagnoses    Mixed hyperlipidemia    -  Primary   Relevant Orders   CBC with Differential/Platelet   Comprehensive metabolic panel   Lipid panel   Vitamin D insufficiency       Relevant Orders   VITAMIN D 25 Hydroxy (Vit-D Deficiency, Fractures)   Other specified hypothyroidism       Relevant Orders   TSH      No orders of the defined types were placed in this encounter.   Follow-up: Return in about 6 months (around 12/19/2019) for chronic fasting.    SARA R Mansi Tokar, [name]

## 2019-06-20 LAB — COMPREHENSIVE METABOLIC PANEL
ALT: 20 IU/L (ref 0–32)
AST: 23 IU/L (ref 0–40)
Albumin/Globulin Ratio: 1.7 (ref 1.2–2.2)
Albumin: 4.3 g/dL (ref 3.6–4.6)
Alkaline Phosphatase: 86 IU/L (ref 39–117)
BUN/Creatinine Ratio: 26 (ref 12–28)
BUN: 24 mg/dL (ref 8–27)
Bilirubin Total: 1.1 mg/dL (ref 0.0–1.2)
CO2: 21 mmol/L (ref 20–29)
Calcium: 9.7 mg/dL (ref 8.7–10.3)
Chloride: 106 mmol/L (ref 96–106)
Creatinine, Ser: 0.93 mg/dL (ref 0.57–1.00)
GFR calc Af Amer: 65 mL/min/{1.73_m2} (ref >59)
GFR calc non Af Amer: 56 mL/min/{1.73_m2} — ABNORMAL LOW (ref >59)
Globulin, Total: 2.5 g/dL (ref 1.5–4.5)
Glucose: 96 mg/dL (ref 65–99)
Potassium: 4.8 mmol/L (ref 3.5–5.2)
Sodium: 141 mmol/L (ref 134–144)
Total Protein: 6.8 g/dL (ref 6.0–8.5)

## 2019-06-20 LAB — CBC WITH DIFFERENTIAL/PLATELET
Basophils Absolute: 0.1 10*3/uL (ref 0.0–0.2)
Basos: 1 %
EOS (ABSOLUTE): 0.2 10*3/uL (ref 0.0–0.4)
Eos: 4 %
Hematocrit: 44.4 % (ref 34.0–46.6)
Hemoglobin: 14.8 g/dL (ref 11.1–15.9)
Immature Grans (Abs): 0.1 10*3/uL (ref 0.0–0.1)
Immature Granulocytes: 1 %
Lymphocytes Absolute: 1.8 10*3/uL (ref 0.7–3.1)
Lymphs: 28 %
MCH: 31.8 pg (ref 26.6–33.0)
MCHC: 33.3 g/dL (ref 31.5–35.7)
MCV: 95 fL (ref 79–97)
Monocytes Absolute: 0.6 10*3/uL (ref 0.1–0.9)
Monocytes: 9 %
Neutrophils Absolute: 3.6 10*3/uL (ref 1.4–7.0)
Neutrophils: 57 %
Platelets: 221 10*3/uL (ref 150–450)
RBC: 4.66 x10E6/uL (ref 3.77–5.28)
RDW: 12.5 % (ref 11.7–15.4)
WBC: 6.4 10*3/uL (ref 3.4–10.8)

## 2019-06-20 LAB — VITAMIN D 25 HYDROXY (VIT D DEFICIENCY, FRACTURES): Vit D, 25-Hydroxy: 54 ng/mL (ref 30.0–100.0)

## 2019-06-20 LAB — LIPID PANEL
Chol/HDL Ratio: 2.3 ratio (ref 0.0–4.4)
Cholesterol, Total: 178 mg/dL (ref 100–199)
HDL: 78 mg/dL (ref >39)
LDL Chol Calc (NIH): 86 mg/dL (ref 0–99)
Triglycerides: 75 mg/dL (ref 0–149)
VLDL Cholesterol Cal: 14 mg/dL (ref 5–40)

## 2019-06-20 LAB — TSH: TSH: 2.57 u[IU]/mL (ref 0.450–4.500)

## 2019-06-20 LAB — CARDIOVASCULAR RISK ASSESSMENT

## 2019-06-21 DIAGNOSIS — M47817 Spondylosis without myelopathy or radiculopathy, lumbosacral region: Secondary | ICD-10-CM | POA: Diagnosis not present

## 2019-06-21 DIAGNOSIS — M1612 Unilateral primary osteoarthritis, left hip: Secondary | ICD-10-CM | POA: Diagnosis not present

## 2019-06-21 DIAGNOSIS — M25552 Pain in left hip: Secondary | ICD-10-CM | POA: Diagnosis not present

## 2019-07-02 ENCOUNTER — Other Ambulatory Visit: Payer: Self-pay | Admitting: Physician Assistant

## 2019-07-02 MED ORDER — SIMVASTATIN 10 MG PO TABS: 10.0000 mg | ORAL_TABLET | Freq: Every day | ORAL | 1 refills | Status: DC

## 2019-07-02 MED ORDER — CETIRIZINE HCL 10 MG PO TABS: 10.0000 mg | ORAL_TABLET | Freq: Every day | ORAL | 1 refills | Status: DC

## 2019-07-09 ENCOUNTER — Ambulatory Visit (INDEPENDENT_AMBULATORY_CARE_PROVIDER_SITE_OTHER): Payer: Medicare Other

## 2019-07-09 DIAGNOSIS — M810 Age-related osteoporosis without current pathological fracture: Secondary | ICD-10-CM

## 2019-07-09 MED ORDER — DENOSUMAB 60 MG/ML ~~LOC~~ SOSY
60.0000 mg | PREFILLED_SYRINGE | Freq: Once | SUBCUTANEOUS | Status: AC
  Administered 2019-07-09: 60 mg via SUBCUTANEOUS

## 2019-09-04 DIAGNOSIS — N390 Urinary tract infection, site not specified: Secondary | ICD-10-CM | POA: Diagnosis not present

## 2019-09-27 ENCOUNTER — Encounter: Payer: Self-pay | Admitting: Physician Assistant

## 2019-09-27 ENCOUNTER — Ambulatory Visit (INDEPENDENT_AMBULATORY_CARE_PROVIDER_SITE_OTHER): Payer: Medicare Other | Admitting: Physician Assistant

## 2019-09-27 ENCOUNTER — Other Ambulatory Visit: Payer: Self-pay

## 2019-09-27 VITALS — BP 118/78 | HR 94 | Temp 97.9°F | Ht 63.0 in | Wt 130.0 lb

## 2019-09-27 DIAGNOSIS — M25552 Pain in left hip: Secondary | ICD-10-CM | POA: Diagnosis not present

## 2019-09-27 NOTE — Progress Notes (Signed)
Acute Office Visit  Subjective:    Patient ID: Claudia Rasmussen, female    DOB: April 07, 1931, 86 y.o.   MRN: [id number]  Chief Complaint  Patient presents with   Left hip pain, wants referral to physical theraphy    HPI Patient is in today for left hip pain Pt states she has had pain in her hip now since March - has tried meds and at home rom exercises - had a hip xray which was normal - would like to be referred to PT in [place]  Past Medical History:  Diagnosis Date   Age-related osteoporosis without current pathological fracture    Atrophy of thyroid (acquired)    Diverticulitis    Diverticulitis of large intestine without perforation or abscess without bleeding    Migraine without aura, not intractable, without status migrainosus    Mixed hyperlipidemia    Stroke [facility])    Thyroid disease    Vitamin D deficiency, unspecified     Past Surgical History:  Procedure Laterality Date   ABDOMINAL HYSTERECTOMY     APPENDECTOMY     CATARACT EXTRACTION, BILATERAL Bilateral    right- 03/22/2018 and left 05/10/2018    Family History  Problem Relation Age of Onset   Breast cancer Mother    Colon cancer Father    Prostate cancer Brother    Coronary artery disease Other    Hypertension Other     Social History   Socioeconomic History   Marital status: Widowed    Spouse name: Not on file   Number of children: 3   Years of education: Not on file   Highest education level: Not on file  Occupational History   Not on file  Tobacco Use   Smoking status: Former Smoker   Smokeless tobacco: Never Used  Scientific laboratory technician Use: Never used  Substance and Sexual Activity   Alcohol use: Yes    Comment: Drinks alcohol very infrequently. She typically consumes white wine.   Drug use: Never   Sexual activity: Not on file  Other Topics Concern   Not on file  Social History Narrative   Not on file   Social Determinants of Health   Financial  Resource Strain:    Difficulty of Paying Living Expenses:   Food Insecurity:    Worried About [place] in the Last Year:    Arboriculturist in the Last Year:   Transportation Needs:    Film/video editor (Medical):    Lack of Transportation (Non-Medical):   Physical Activity:    Days of Exercise per Week:    Minutes of Exercise per Session:   Stress:    Feeling of Stress :   Social Connections:    Frequency of Communication with Friends and Family:    Frequency of Social Gatherings with Friends and Family:    Attends Religious Services:    Active Member of Clubs or Organizations:    Attends Music therapist:    Marital Status:   Intimate Partner Violence:    Fear of Current or Ex-Partner:    Emotionally Abused:    Physically Abused:    Sexually Abused:      Current Outpatient Medications:    aspirin EC 81 MG tablet, Take 81 mg by mouth daily., Disp: , Rfl:    bethanechol (URECHOLINE) 25 MG tablet, Take 25 mg by mouth 2 (two) times daily., Disp: , Rfl:    cetirizine (ZYRTEC) 10 MG  tablet, Take 1 tablet (10 mg total) by mouth daily., Disp: 90 tablet, Rfl: 1   Cholecalciferol (VITAMIN D3) 25 MCG (1000 UT) CAPS, Take 1,000 Units by mouth daily., Disp: , Rfl:    clopidogrel (PLAVIX) 75 MG tablet, Take 75 mg by mouth daily., Disp: , Rfl:    furosemide (LASIX) 20 MG tablet, Take 20 mg by mouth daily as needed (Swelling)., Disp: , Rfl:    nadolol (CORGARD) 20 MG tablet, Take 20 mg by mouth daily., Disp: , Rfl:    nitrofurantoin (MACRODANTIN) 50 MG capsule, Take 50 mg by mouth daily., Disp: , Rfl:    simvastatin (ZOCOR) 10 MG tablet, Take 1 tablet (10 mg total) by mouth daily., Disp: 90 tablet, Rfl: 1   SYNTHROID 75 MCG tablet, TAKE 1 TABLET DAILY, Disp: 90 tablet, Rfl: 3   Allergies  Allergen Reactions   Shellfish Allergy Other (See Comments)    Itching, Hives   Avelox [Moxifloxacin Hcl In Nacl]    Cephalosporins Other  (See Comments)    Itching    Levaquin [Levofloxacin]    Azithromycin Rash   Moxifloxacin Hcl Rash   Phenobarbital Rash    CONSTITUTIONAL: Negative for chills, fatigue, fever, unintentional weight gain and unintentional weight loss.  CARDIOVASCULAR: Negative for chest pain, dizziness, palpitations and pedal edema.  RESPIRATORY: Negative for recent cough and dyspnea.  MSK: see HPI I        Objective:    PHYSICAL EXAM:   VS: BP 118/78 (BP Location: Left Arm, Patient Position: Sitting)    Pulse 94    Temp 97.9 F (36.6 C) (Temporal)    Ht 5\' 3"  (1.6 m)    Wt 130 lb (59 kg)    SpO2 97%    BMI 23.03 kg/m   GEN: Well nourished, well developed, in no acute distress  Cardiac: RRR; no murmurs, rubs, or gallops,no edema -  Respiratory:  normal respiratory rate and pattern with no distress - normal breath sounds with no rales, rhonchi, wheezes or rubs  Psych: euthymic mood, appropriate affect and demeanor   Wt Readings from Last 3 Encounters:  09/27/19 130 lb (59 kg)  06/19/19 129 lb (58.5 kg)  09/29/18 125 lb (56.7 kg)    Health Maintenance Due  Topic Date Due   TETANUS/TDAP  Never done   INFLUENZA VACCINE  09/23/2019    There are no preventive care reminders to display for this patient.        Assessment & Plan:   Problem List Items Addressed This Visit      Other   Left hip pain - Primary    Referral to PT at pt request to  [facility] in [facility] p [phone] fax [phone] appt 8/16 at 4pm          No orders of the defined types were placed in this encounter.    SARA R Shaylie Eklund, [name]

## 2019-09-27 NOTE — Assessment & Plan Note (Signed)
Referral to PT at pt request to  [facility] in [facility] p [phone] fax [phone] appt 8/16 at 4pm

## 2019-10-01 ENCOUNTER — Other Ambulatory Visit: Payer: Self-pay | Admitting: Physician Assistant

## 2019-10-17 DIAGNOSIS — M25552 Pain in left hip: Secondary | ICD-10-CM | POA: Diagnosis not present

## 2019-10-24 DIAGNOSIS — M25552 Pain in left hip: Secondary | ICD-10-CM | POA: Diagnosis not present

## 2019-10-31 DIAGNOSIS — M25552 Pain in left hip: Secondary | ICD-10-CM | POA: Diagnosis not present

## 2019-11-07 DIAGNOSIS — M25552 Pain in left hip: Secondary | ICD-10-CM | POA: Diagnosis not present

## 2019-11-19 IMAGING — CT CT ABDOMEN AND PELVIS WITH CONTRAST
2 of 5 series · 16 of 46 positions shown, 18 images · IV contrast (APPLIED)
Comparison: CT 11/11/2010, 05/11/2018

CLINICAL DATA: Left upper quadrant abdominal pain

EXAM:
CT ABDOMEN AND PELVIS WITH CONTRAST
TECHNIQUE: Multidetector CT imaging of the abdomen and pelvis was performed
using the standard protocol following bolus administration of
intravenous contrast.
CONTRAST:  100mL OMNIPAQUE IOHEXOL 300 MG/ML  SOLN

[Series 2: axial st · axial · 0.78mm/px · z∈[-440,-44]mm · 13 of 89 slices shown, 15 images]
[im 5/89  soft-tissue]
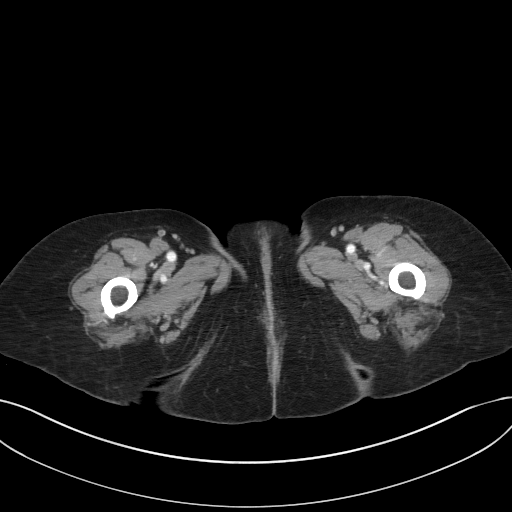
[im 5/89  bone]
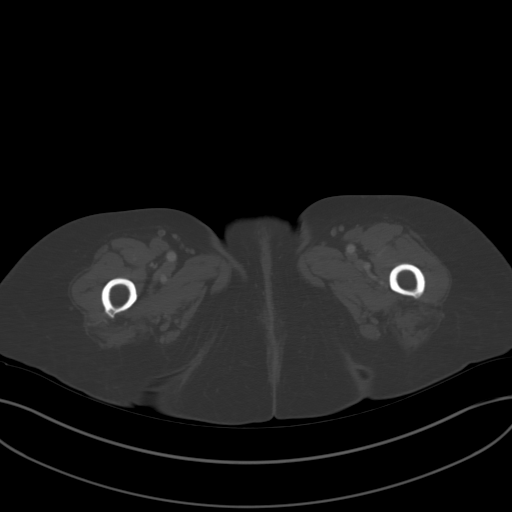
[im 14/89  soft-tissue]
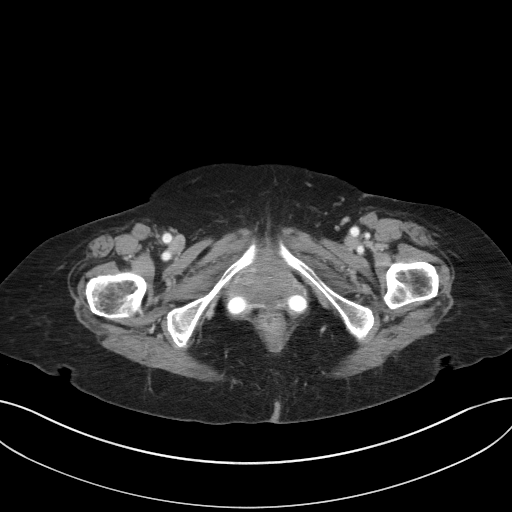
[im 19/89  soft-tissue]
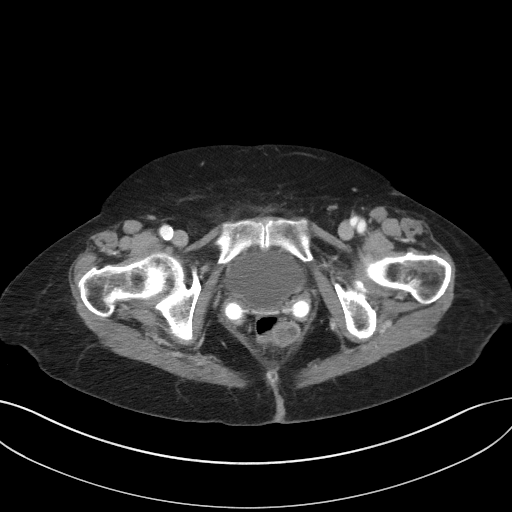
[im 24/89  soft-tissue]
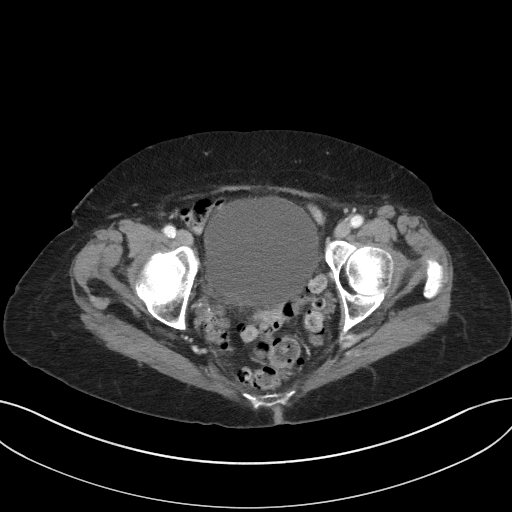
[im 33/89  soft-tissue]
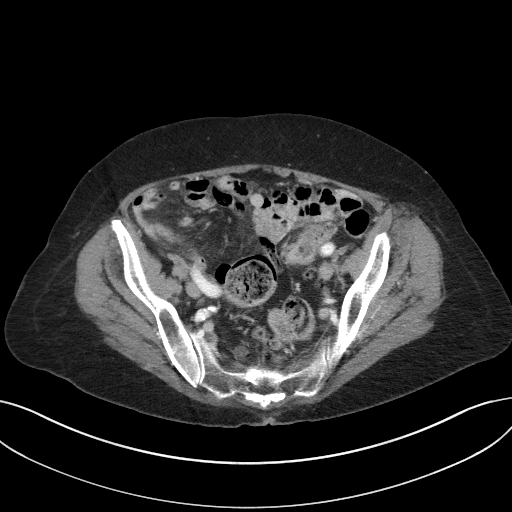
[im 38/89  soft-tissue]
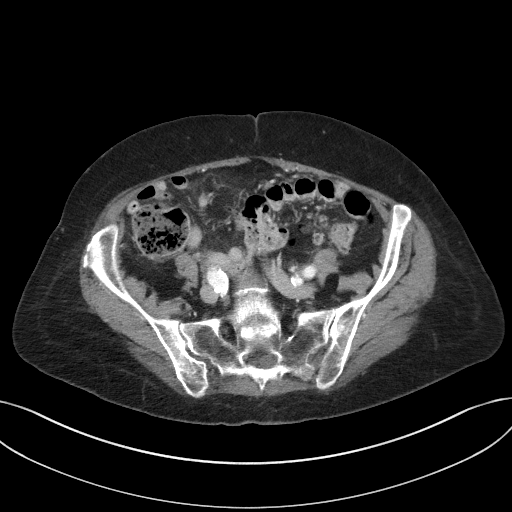
[im 47/89  soft-tissue]
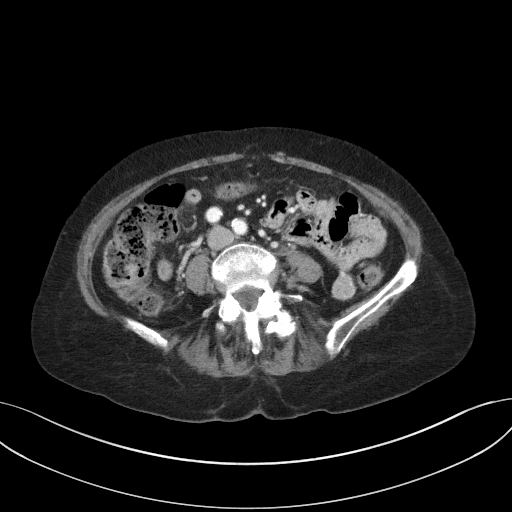
[im 51/89  soft-tissue]
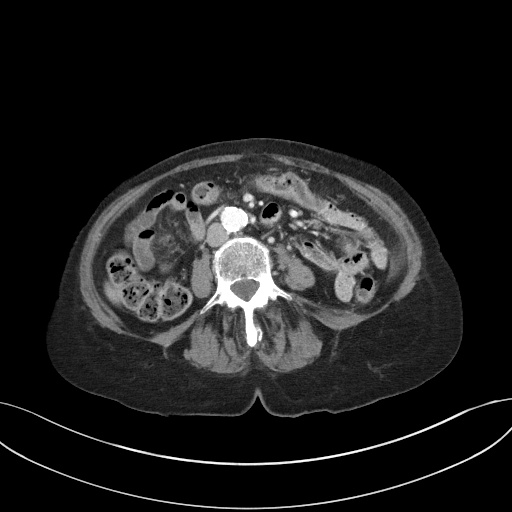
[im 56/89  soft-tissue]
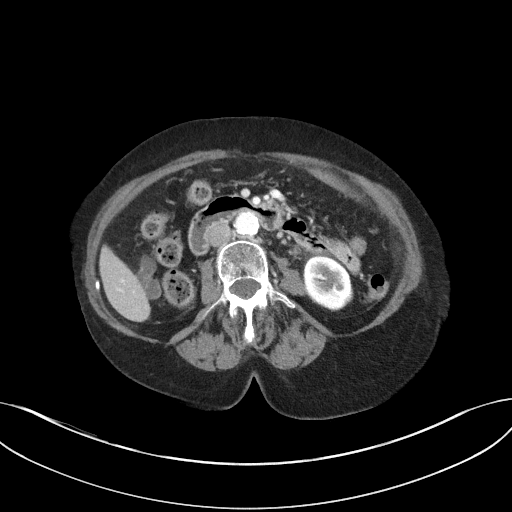
[im 56/89  bone]
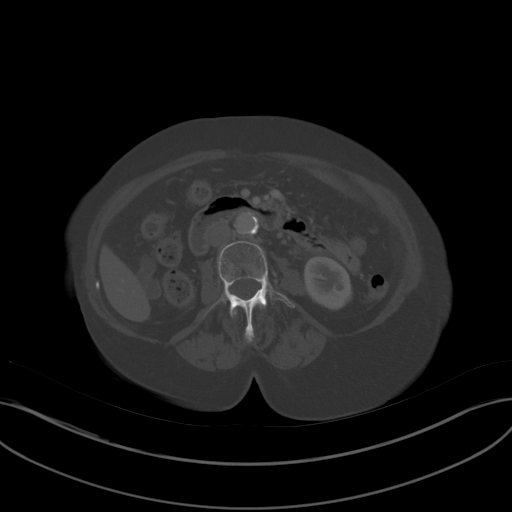
[im 65/89  soft-tissue]
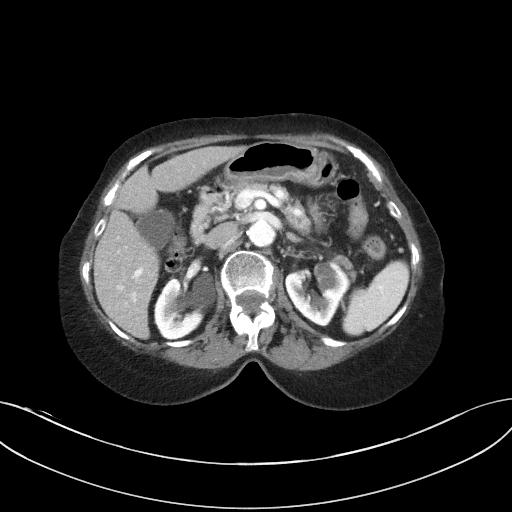
[im 70/89  soft-tissue]
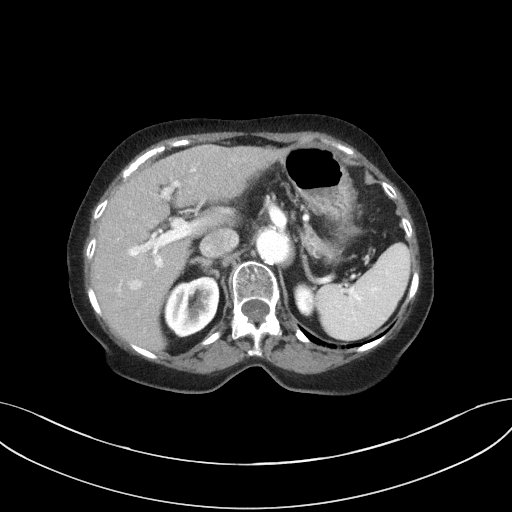
[im 75/89  soft-tissue]
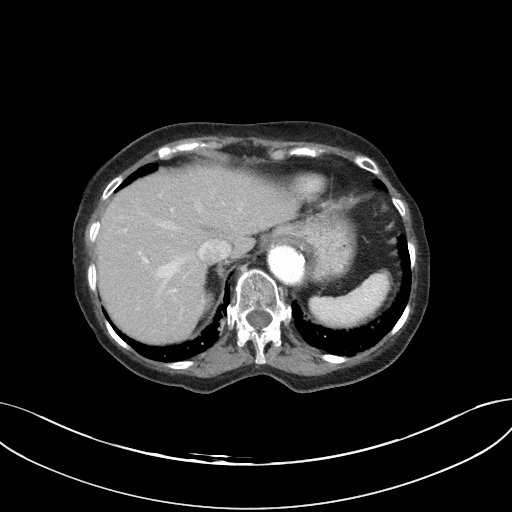
[im 84/89  soft-tissue]
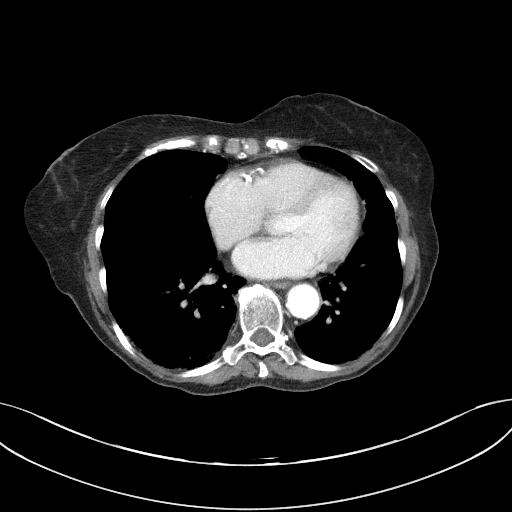

[Series 5: coronal st · coronal · 0.78mm/px · 3 of 80 slices shown]
[im 27/80  soft-tissue]
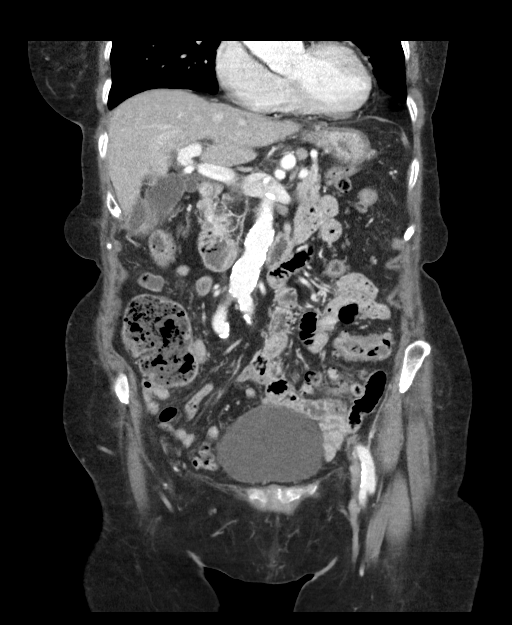
[im 36/80  soft-tissue]
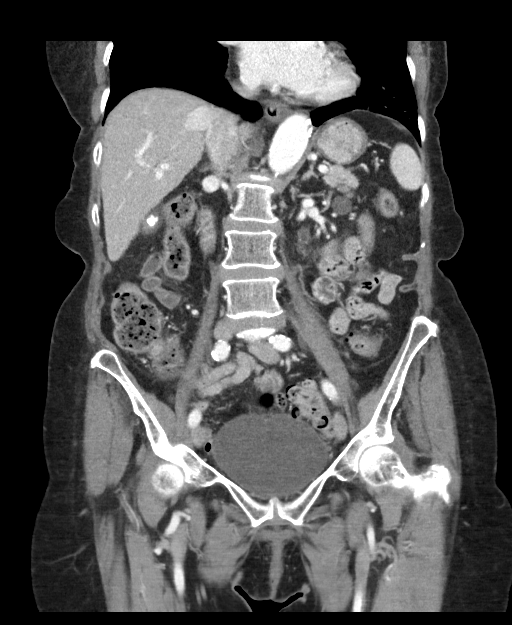
[im 44/80  soft-tissue]
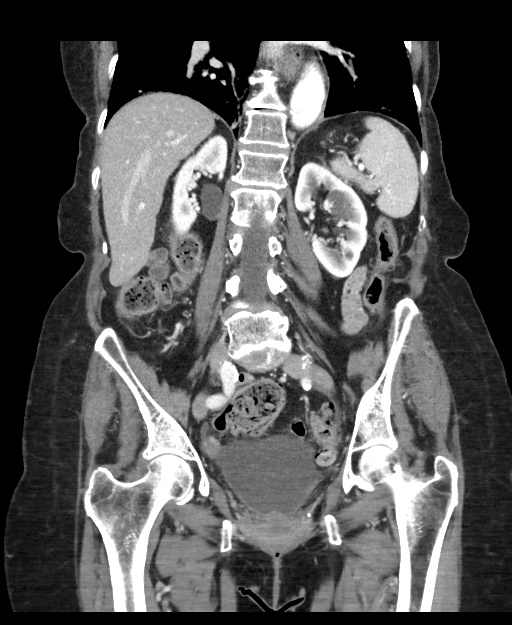

[16 of 46 positions shown; findings below may reference images not displayed]

FINDINGS: Lower chest: No acute abnormality.

Hepatobiliary: No focal liver abnormality. There is a 1.2 cm
calcified gallstone noted dependently within the gallbladder lumen.
No evidence of gallbladder wall thickening or pericholecystic edema.
No intrahepatic biliary ductal dilatation.

Pancreas: Unremarkable. No pancreatic ductal dilatation or
surrounding inflammatory changes.

Spleen: Normal in size without focal abnormality.

Adrenals/Urinary Tract: Adrenal glands within normal limits. Similar
appearance of low-density left-sided renal lesions, likely cysts.
Mild bilateral hydronephrosis. No discrete renal or ureteral calculi
identified. Urinary bladder unremarkable.

Stomach/Bowel: Stomach is within normal limits. No small bowel
thickening or distention. There is mild diffuse long segment colonic
wall thickening involving the majority of the colon, most prominent
within the descending and transverse colon. Mildly increased
haziness within the right lower quadrant mesentery. Appendix not
definitively identified. No evidence of perforation, pericolonic
fluid, or abscess.

Vascular/Lymphatic: Aortoiliac atherosclerotic calcifications. No
abdominopelvic lymphadenopathy.

Reproductive: Status post hysterectomy.  Pessary device in place.

Other: No abdominopelvic free fluid.  No abdominal wall hernia.

Musculoskeletal: Grade 1 anterolisthesis L5 on S1. No acute osseous
abnormality.
IMPRESSION: 1. Mild diffuse colonic wall thickening suggesting a nonspecific
colitis. There is mild increased haziness in the mesentery of the
right lower quadrant. Appendix not clearly identified.
2. Cholelithiasis.

## 2019-11-21 DIAGNOSIS — M25552 Pain in left hip: Secondary | ICD-10-CM | POA: Diagnosis not present

## 2019-12-05 ENCOUNTER — Other Ambulatory Visit: Payer: Self-pay | Admitting: Physician Assistant

## 2019-12-17 DIAGNOSIS — H0100A Unspecified blepharitis right eye, upper and lower eyelids: Secondary | ICD-10-CM | POA: Diagnosis not present

## 2019-12-17 DIAGNOSIS — H0100B Unspecified blepharitis left eye, upper and lower eyelids: Secondary | ICD-10-CM | POA: Diagnosis not present

## 2019-12-17 DIAGNOSIS — H16223 Keratoconjunctivitis sicca, not specified as Sjogren's, bilateral: Secondary | ICD-10-CM | POA: Diagnosis not present

## 2019-12-18 DIAGNOSIS — Z23 Encounter for immunization: Secondary | ICD-10-CM | POA: Diagnosis not present

## 2019-12-19 ENCOUNTER — Encounter: Payer: Self-pay | Admitting: Nurse Practitioner

## 2019-12-19 ENCOUNTER — Other Ambulatory Visit: Payer: Self-pay

## 2019-12-19 ENCOUNTER — Ambulatory Visit (INDEPENDENT_AMBULATORY_CARE_PROVIDER_SITE_OTHER): Payer: Medicare Other | Admitting: Nurse Practitioner

## 2019-12-19 ENCOUNTER — Encounter: Payer: Self-pay | Admitting: Gastroenterology

## 2019-12-19 VITALS — BP 116/74 | HR 85 | Temp 97.3°F | Ht 63.0 in | Wt 127.0 lb

## 2019-12-19 DIAGNOSIS — Z6822 Body mass index (BMI) 22.0-22.9, adult: Secondary | ICD-10-CM

## 2019-12-19 DIAGNOSIS — R1013 Epigastric pain: Secondary | ICD-10-CM | POA: Diagnosis not present

## 2019-12-19 MED ORDER — OMEPRAZOLE 20 MG PO CPDR: 20.0000 mg | DELAYED_RELEASE_CAPSULE | Freq: Every day | ORAL | 0 refills | Status: DC

## 2019-12-19 NOTE — Patient Instructions (Addendum)
We will call you with GI appointment and lab results Begin Omeprazole 20 mg daily Avoid acidic and citrus foods/beverages Return in 6 months for follow-up  Omeprazole tablets (OTC) What is this medicine? OMEPRAZOLE (oh ME pray zol) prevents the production of acid in the stomach. It is used to treat the symptoms of heartburn. You can buy this medicine without a prescription. This product is not for long-term use, unless otherwise directed by your doctor or health care professional. This medicine may be used for other purposes; ask your health care provider or pharmacist if you have questions. COMMON BRAND NAME(S): Prilosec OTC What should I tell my health care provider before I take this medicine? They need to know if you have any of these conditions:  black or bloody stools  chest pain  difficulty swallowing  have had heartburn for over 3 months  have heartburn with dizziness, lightheadedness or sweating  liver disease  lupus  stomach pain  unexplained weight loss  vomiting with blood  wheezing  an unusual or allergic reaction to omeprazole, other medicines, foods, dyes, or preservatives  pregnant or trying to get pregnant  breast-feeding How should I use this medicine? Take this medicine by mouth with a glass of water. Follow the directions on the product label. Do not cut, crush or chew this medicine. Swallow the tablets whole. Take this medicine on an empty stomach, at least 30 minutes before breakfast. Take your medicine at regular intervals. Do not take it more often than directed. Talk to your pediatrician regarding the use of this medicine in children. Special care may be needed. Overdosage: If you think you have taken too much of this medicine contact a poison control center or emergency room at once. NOTE: This medicine is only for you. Do not share this medicine with others. What if I miss a dose? If you miss a dose, take it as soon as you can. If it is almost  time for your next dose, take only that dose. Do not take double or extra doses. What may interact with this medicine? Do not take this medicine with any of the following medications:  atazanavir  clopidogrel  nelfinavir  rilpivirine This medicine may also interact with the following medications:  antifungals like itraconazole, ketoconazole, and voriconazole  certain antivirals for HIV or hepatitis  certain medicines that treat or prevent blood clots like warfarin  cilostazol  citalopram  cyclosporine  dasatinib  digoxin  disulfiram  diuretics  erlotinib  iron supplements  medicines for anxiety, panic, and sleep like diazepam  medicines for seizures like carbamazepine, phenobarbital, phenytoin  methotrexate  mycophenolate mofetil  nilotinib  rifampin  St. John's wort  tacrolimus  vitamin B12 This list may not describe all possible interactions. Give your health care provider a list of all the medicines, herbs, non-prescription drugs, or dietary supplements you use. Also tell them if you smoke, drink alcohol, or use illegal drugs. Some items may interact with your medicine. What should I watch for while using this medicine? It can take several days before your heartburn gets better. Tell your healthcare professional if your symptoms do not start to get better or if they get worse. If you need to take this medicine for more than 14 days, talk to your healthcare professional. Heartburn may sometimes be caused by a more serious condition. This medicine may cause a decrease in vitamin B12. You should make sure that you get enough vitamin B12 while you are taking this medicine. Discuss  the foods you eat and the vitamins you take with your health care professional. What side effects may I notice from receiving this medicine? Side effects that you should report to your doctor or health care professional as soon as possible:  allergic reactions like skin rash,  itching or hives, swelling of the face, lips, or tongue  bone pain  breathing problems  fever or sore throat  joint pain  rash on cheeks or arms that gets worse in the sun  redness, blistering, peeling, or loosening of the skin, including inside the mouth  severe diarrhea  signs and symptoms of kidney injury like trouble passing urine or change in the amount of urine  signs and symptoms of low magnesium like muscle cramps; muscle pain; muscle weakness; tremors; seizures; or fast, irregular heartbeat  stomach polyps  unusual bleeding or bruising Side effects that usually do not require medical attention (report to your doctor or health care professional if they continue or are bothersome):  diarrhea  dry mouth  gas  headache  nausea  stomach pain This list may not describe all possible side effects. Call your doctor for medical advice about side effects. You may report side effects to FDA at 1-800-FDA-1088. Where should I keep my medicine? Keep out of the reach of children. Store at room temperature between 20 and 25 degrees C (68 and 77 degrees F). Protect from light and moisture. Throw away any unused medicine after the expiration date. NOTE: This sheet is a summary. It may not cover all possible information. If you have questions about this medicine, talk to your doctor, pharmacist, or health care provider.  2020 Elsevier/Gold Standard (2017-11-30 13:06:30) Food Choices for Gastroesophageal Reflux Disease, Adult When you have gastroesophageal reflux disease (GERD), the foods you eat and your eating habits are very important. Choosing the right foods can help ease your discomfort. Think about working with a nutrition specialist (dietitian) to help you make good choices. What are tips for following this plan?  Meals  Choose healthy foods that are low in fat, such as fruits, vegetables, whole grains, low-fat dairy products, and lean meat, fish, and poultry.  Eat  small meals often instead of 3 large meals a day. Eat your meals slowly, and in a place where you are relaxed. Avoid bending over or lying down until 2-3 hours after eating.  Avoid eating meals 2-3 hours before bed.  Avoid drinking a lot of liquid with meals.  Cook foods using methods other than frying. Bake, grill, or broil food instead.  Avoid or limit: ? Chocolate. ? Peppermint or spearmint. ? Alcohol. ? Pepper. ? Black and decaffeinated coffee. ? Black and decaffeinated tea. ? Bubbly (carbonated) soft drinks. ? Caffeinated energy drinks and soft drinks.  Limit high-fat foods such as: ? Fatty meat or fried foods. ? Whole milk, cream, butter, or ice cream. ? Nuts and nut butters. ? Pastries, donuts, and sweets made with butter or shortening.  Avoid foods that cause symptoms. These foods may be different for everyone. Common foods that cause symptoms include: ? Tomatoes. ? Oranges, lemons, and limes. ? Peppers. ? Spicy food. ? Onions and garlic. ? Vinegar. Lifestyle  Maintain a healthy weight. Ask your doctor what weight is healthy for you. If you need to lose weight, work with your doctor to do so safely.  Exercise for at least 30 minutes for 5 or more days each week, or as told by your doctor.  Wear loose-fitting clothes.  Do not smoke.  If you need help quitting, ask your doctor.  Sleep with the head of your bed higher than your feet. Use a wedge under the mattress or blocks under the bed frame to raise the head of the bed. Summary  When you have gastroesophageal reflux disease (GERD), food and lifestyle choices are very important in easing your symptoms.  Eat small meals often instead of 3 large meals a day. Eat your meals slowly, and in a place where you are relaxed.  Limit high-fat foods such as fatty meat or fried foods.  Avoid bending over or lying down until 2-3 hours after eating.  Avoid peppermint and spearmint, caffeine, alcohol, and chocolate. This  information is not intended to replace advice given to you by your health care provider. Make sure you discuss any questions you have with your health care provider. Document Revised: 06/01/2018 Document Reviewed: 03/16/2016 Elsevier Patient Education  [place]. Gastroesophageal Reflux Disease, Adult Gastroesophageal reflux (GER) happens when acid from the stomach flows up into the tube that connects the mouth and the stomach (esophagus). Normally, food travels down the esophagus and stays in the stomach to be digested. With GER, food and stomach acid sometimes move back up into the esophagus. You may have a disease called gastroesophageal reflux disease (GERD) if the reflux:  Happens often.  Causes frequent or very bad symptoms.  Causes problems such as damage to the esophagus. When this happens, the esophagus becomes sore and swollen (inflamed). Over time, GERD can make small holes (ulcers) in the lining of the esophagus. What are the causes? This condition is caused by a problem with the muscle between the esophagus and the stomach. When this muscle is weak or not normal, it does not close properly to keep food and acid from coming back up from the stomach. The muscle can be weak because of:  Tobacco use.  Pregnancy.  Having a certain type of hernia (hiatal hernia).  Alcohol use.  Certain foods and drinks, such as coffee, chocolate, onions, and peppermint. What increases the risk? You are more likely to develop this condition if you:  Are overweight.  Have a disease that affects your connective tissue.  Use NSAID medicines. What are the signs or symptoms? Symptoms of this condition include:  Heartburn.  Difficult or painful swallowing.  The feeling of having a lump in the throat.  A bitter taste in the mouth.  Bad breath.  Having a lot of saliva.  Having an upset or bloated stomach.  Belching.  Chest pain. Different conditions can cause chest pain. Make  sure you see your doctor if you have chest pain.  Shortness of breath or noisy breathing (wheezing).  Ongoing (chronic) cough or a cough at night.  Wearing away of the surface of teeth (tooth enamel).  Weight loss. How is this treated? Treatment will depend on how bad your symptoms are. Your doctor may suggest:  Changes to your diet.  Medicine.  Surgery. Follow these instructions at home: Eating and drinking   Follow a diet as told by your doctor. You may need to avoid foods and drinks such as: ? Coffee and tea (with or without caffeine). ? Drinks that contain alcohol. ? Energy drinks and sports drinks. ? Bubbly (carbonated) drinks or sodas. ? Chocolate and cocoa. ? Peppermint and mint flavorings. ? Garlic and onions. ? Horseradish. ? Spicy and acidic foods. These include peppers, chili powder, curry powder, vinegar, hot sauces, and BBQ sauce. ? Citrus fruit juices and citrus  fruits, such as oranges, lemons, and limes. ? Tomato-based foods. These include red sauce, chili, salsa, and pizza with red sauce. ? Fried and fatty foods. These include donuts, french fries, potato chips, and high-fat dressings. ? High-fat meats. These include hot dogs, rib eye steak, sausage, ham, and bacon. ? High-fat dairy items, such as whole milk, butter, and cream cheese.  Eat small meals often. Avoid eating large meals.  Avoid drinking large amounts of liquid with your meals.  Avoid eating meals during the 2-3 hours before bedtime.  Avoid lying down right after you eat.  Do not exercise right after you eat. Lifestyle   Do not use any products that contain nicotine or tobacco. These include cigarettes, e-cigarettes, and chewing tobacco. If you need help quitting, ask your doctor.  Try to lower your stress. If you need help doing this, ask your doctor.  If you are overweight, lose an amount of weight that is healthy for you. Ask your doctor about a safe weight loss goal. General  instructions  Pay attention to any changes in your symptoms.  Take over-the-counter and prescription medicines only as told by your doctor. Do not take aspirin, ibuprofen, or other NSAIDs unless your doctor says it is okay.  Wear loose clothes. Do not wear anything tight around your waist.  Raise (elevate) the head of your bed about 6 inches (15 cm).  Avoid bending over if this makes your symptoms worse.  Keep all follow-up visits as told by your doctor. This is important. Contact a doctor if:  You have new symptoms.  You lose weight and you do not know why.  You have trouble swallowing or it hurts to swallow.  You have wheezing or a cough that keeps happening.  Your symptoms do not get better with treatment.  You have a hoarse voice. Get help right away if:  You have pain in your arms, neck, jaw, teeth, or back.  You feel sweaty, dizzy, or light-headed.  You have chest pain or shortness of breath.  You throw up (vomit) and your throw-up looks like blood or coffee grounds.  You pass out (faint).  Your poop (stool) is bloody or black.  You cannot swallow, drink, or eat. Summary  If a person has gastroesophageal reflux disease (GERD), food and stomach acid move back up into the esophagus and cause symptoms or problems such as damage to the esophagus.  Treatment will depend on how bad your symptoms are.  Follow a diet as told by your doctor.  Take all medicines only as told by your doctor. This information is not intended to replace advice given to you by your health care provider. Make sure you discuss any questions you have with your health care provider. Document Revised: 08/17/2017 Document Reviewed: 08/17/2017 Elsevier Patient Education  [place].

## 2019-12-19 NOTE — Progress Notes (Signed)
Subjective:  Patient ID: Claudia Rasmussen, female    DOB: 1931-05-18  Age: 86 y.o. MRN: [id number]  Chief Complaint  Patient presents with  . Mid-epigastric pain        HPI [name] is an 86 year old Caucasian female who presents with mid-epigastric pain associated with not eating. The symptoms began two weeks ago. Associated symptoms include belching, nausea, and two episodes of vomiting. She has lost three pounds. Treatments include gingerale and small amounts of crackers. She denies fever, diarrhea, constipation, or being around any known sick contacts. She denies previous history of GERD, peptic or gastric ulcers. She does have a history of colitis, c-diff, and diverticulosis/diverticulitis but not recently. Other medical problems include hyperlipidemia, thyroid disorder, previous CVA, osteoporosis, and osteoarthritis.       Current Outpatient Medications on File Prior to Visit  Medication Sig Dispense Refill  . aspirin EC 81 MG tablet Take 81 mg by mouth daily.    . bethanechol (URECHOLINE) 25 MG tablet Take 25 mg by mouth 2 (two) times daily.    . cetirizine (ZYRTEC) 10 MG tablet TAKE 1 TABLET DAILY 90 tablet 3  . Cholecalciferol (VITAMIN D3) 25 MCG (1000 UT) CAPS Take 1,000 Units by mouth daily.    . clopidogrel (PLAVIX) 75 MG tablet TAKE 1 TABLET DAILY 90 tablet 0  . furosemide (LASIX) 20 MG tablet Take 20 mg by mouth daily as needed (Swelling).    . nadolol (CORGARD) 20 MG tablet Take 20 mg by mouth daily.    . nitrofurantoin (MACRODANTIN) 50 MG capsule Take 50 mg by mouth daily.    . simvastatin (ZOCOR) 10 MG tablet Take 1 tablet (10 mg total) by mouth daily. 90 tablet 1  . SYNTHROID 75 MCG tablet TAKE 1 TABLET DAILY 90 tablet 3   No current facility-administered medications on file prior to visit.   Past Medical History:  Diagnosis Date  . Age-related osteoporosis without current pathological fracture   . Atrophy of thyroid (acquired)   . Diverticulitis   .  Diverticulitis of large intestine without perforation or abscess without bleeding   . Migraine without aura, not intractable, without status migrainosus   . Mixed hyperlipidemia   . Stroke ([place])   . Thyroid disease   . Vitamin D deficiency, unspecified    Past Surgical History:  Procedure Laterality Date  . ABDOMINAL HYSTERECTOMY    . APPENDECTOMY    . CATARACT EXTRACTION, BILATERAL Bilateral    right- 03/22/2018 and left 05/10/2018    Family History  Problem Relation Age of Onset  . Breast cancer Mother   . Colon cancer Father   . Prostate cancer Brother   . Coronary artery disease Other   . Hypertension Other    Social History   Socioeconomic History  . Marital status: Widowed    Spouse name: Not on file  . Number of children: 3  . Years of education: Not on file  . Highest education level: Not on file  Occupational History  . Not on file  Tobacco Use  . Smoking status: Former Research scientist (life sciences)  . Smokeless tobacco: Never Used  Vaping Use  . Vaping Use: Never used  Substance and Sexual Activity  . Alcohol use: Yes    Comment: Drinks alcohol very infrequently. She typically consumes white wine.  . Drug use: Never  . Sexual activity: Not on file  Other Topics Concern  . Not on file  Social History Narrative  . Not on file  Review of Systems  Constitutional: Positive for fatigue.  HENT: Negative for congestion, ear pain and sore throat.   Respiratory: Negative for cough.   Cardiovascular: Negative for chest pain.  Gastrointestinal: Positive for abdominal pain (associated without eating), nausea and vomiting. Negative for diarrhea.       Frequent belching   Genitourinary: Negative for dysuria, frequency and urgency.  Musculoskeletal: Negative for arthralgias, back pain and myalgias.  Neurological: Negative for dizziness, syncope, weakness and headaches.  Psychiatric/Behavioral: Negative for agitation and sleep disturbance. The patient is not nervous/anxious.       Objective:  BP 116/74 (BP Location: Left Arm, Patient Position: Sitting, Cuff Size: Normal)   Pulse 85   Temp (!) 97.3 F (36.3 C) (Temporal)   Ht 5\' 3"  (1.6 m)   Wt 127 lb (57.6 kg)   SpO2 97%   BMI 22.50 kg/m   BP/Weight 12/19/2019 09/27/2019 6/96/2952  Systolic BP 841 324 401  Diastolic BP 74 78 68  Wt. (Lbs) 127 130 129  BMI 22.5 23.03 22.85    Physical Exam Vitals reviewed.  Constitutional:      Appearance: Normal appearance. She is normal weight.  HENT:     Head: Normocephalic.     Mouth/Throat:     Mouth: Mucous membranes are moist.  Eyes:     Comments: Eye glasses present  Cardiovascular:     Rate and Rhythm: Normal rate and regular rhythm.     Pulses: Normal pulses.     Heart sounds: Normal heart sounds.  Pulmonary:     Effort: Pulmonary effort is normal.     Breath sounds: Normal breath sounds.  Abdominal:     General: Bowel sounds are normal. There is no distension.     Palpations: Abdomen is soft. There is no mass.     Tenderness: There is abdominal tenderness (mid-epigastric). There is no right CVA tenderness, left CVA tenderness, guarding or rebound.     Hernia: No hernia is present.  Genitourinary:    Comments: Deferred Skin:    General: Skin is warm and dry.     Capillary Refill: Capillary refill takes less than 2 seconds.  Neurological:     General: No focal deficit present.     Mental Status: She is alert and oriented to person, place, and time.  Psychiatric:        Mood and Affect: Mood normal.        Behavior: Behavior normal.        Lab Results  Component Value Date   WBC 6.4 06/19/2019   HGB 14.8 06/19/2019   HCT 44.4 06/19/2019   PLT 221 06/19/2019   GLUCOSE 96 06/19/2019   CHOL 178 06/19/2019   TRIG 75 06/19/2019   HDL 78 06/19/2019   LDLCALC 86 06/19/2019   ALT 20 06/19/2019   AST 23 06/19/2019   NA 141 06/19/2019   K 4.8 06/19/2019   CL 106 06/19/2019   CREATININE 0.93 06/19/2019   BUN 24 06/19/2019   CO2 21  06/19/2019   TSH 2.570 06/19/2019      Assessment & Plan:    1. Epigastric pain - CBC with Differential - H Pylori, IGM, IGG, IGA AB - omeprazole (PRILOSEC) 20 MG capsule; Take 1 capsule (20 mg total) by mouth daily.  Dispense: 90 capsule; Refill: 0 - Comprehensive metabolic panel - Ambulatory referral to Gastroenterology  2. BMI 22.0-22.9, adult -Eat small frequent meals      We will call you with GI  appointment and lab results Begin Omeprazole 20 mg daily Avoid acidic and citrus foods/beverages Return in 6 months for follow-up      Follow-up: 6 months or as needed  An After Visit Summary was printed and given to the patient.  Jerrell Belfast, DNP [facility] [phone]

## 2019-12-20 LAB — COMPREHENSIVE METABOLIC PANEL
ALT: 31 IU/L (ref 0–32)
AST: 30 IU/L (ref 0–40)
Albumin/Globulin Ratio: 1.5 (ref 1.2–2.2)
Albumin: 4.2 g/dL (ref 3.6–4.6)
Alkaline Phosphatase: 108 IU/L (ref 44–121)
BUN/Creatinine Ratio: 17 (ref 12–28)
BUN: 37 mg/dL — ABNORMAL HIGH (ref 8–27)
Bilirubin Total: 0.8 mg/dL (ref 0.0–1.2)
CO2: 21 mmol/L (ref 20–29)
Calcium: 9.7 mg/dL (ref 8.7–10.3)
Chloride: 107 mmol/L — ABNORMAL HIGH (ref 96–106)
Creatinine, Ser: 2.12 mg/dL — ABNORMAL HIGH (ref 0.57–1.00)
GFR calc Af Amer: 24 mL/min/{1.73_m2} — ABNORMAL LOW (ref >59)
GFR calc non Af Amer: 21 mL/min/{1.73_m2} — ABNORMAL LOW (ref >59)
Globulin, Total: 2.8 g/dL (ref 1.5–4.5)
Glucose: 93 mg/dL (ref 65–99)
Potassium: 4.7 mmol/L (ref 3.5–5.2)
Sodium: 141 mmol/L (ref 134–144)
Total Protein: 7 g/dL (ref 6.0–8.5)

## 2019-12-20 LAB — CBC WITH DIFFERENTIAL/PLATELET
Basophils Absolute: 0.1 10*3/uL (ref 0.0–0.2)
Basos: 1 %
EOS (ABSOLUTE): 0.3 10*3/uL (ref 0.0–0.4)
Eos: 5 %
Hematocrit: 37.4 % (ref 34.0–46.6)
Hemoglobin: 12.8 g/dL (ref 11.1–15.9)
Immature Grans (Abs): 0.1 10*3/uL (ref 0.0–0.1)
Immature Granulocytes: 1 %
Lymphocytes Absolute: 0.8 10*3/uL (ref 0.7–3.1)
Lymphs: 11 %
MCH: 31.2 pg (ref 26.6–33.0)
MCHC: 34.2 g/dL (ref 31.5–35.7)
MCV: 91 fL (ref 79–97)
Monocytes Absolute: 0.7 10*3/uL (ref 0.1–0.9)
Monocytes: 9 %
Neutrophils Absolute: 5.4 10*3/uL (ref 1.4–7.0)
Neutrophils: 73 %
Platelets: 218 10*3/uL (ref 150–450)
RBC: 4.1 x10E6/uL (ref 3.77–5.28)
RDW: 12.5 % (ref 11.7–15.4)
WBC: 7.3 10*3/uL (ref 3.4–10.8)

## 2019-12-20 LAB — H PYLORI, IGM, IGG, IGA AB
H pylori, IgM Abs: <9 units (ref 0.0–8.9)
H. pylori, IgA Abs: <9 units (ref 0.0–8.9)
H. pylori, IgG AbS: 0.34 Index Value (ref 0.00–0.79)

## 2019-12-22 ENCOUNTER — Encounter: Payer: Self-pay | Admitting: Nurse Practitioner

## 2020-01-02 ENCOUNTER — Other Ambulatory Visit: Payer: Self-pay | Admitting: Physician Assistant

## 2020-01-02 ENCOUNTER — Other Ambulatory Visit: Payer: Self-pay | Admitting: Family Medicine

## 2020-01-09 DIAGNOSIS — N390 Urinary tract infection, site not specified: Secondary | ICD-10-CM | POA: Diagnosis not present

## 2020-01-09 DIAGNOSIS — N8110 Cystocele, unspecified: Secondary | ICD-10-CM | POA: Diagnosis not present

## 2020-01-09 DIAGNOSIS — R339 Retention of urine, unspecified: Secondary | ICD-10-CM | POA: Diagnosis not present

## 2020-01-16 ENCOUNTER — Ambulatory Visit (INDEPENDENT_AMBULATORY_CARE_PROVIDER_SITE_OTHER): Payer: Medicare Other

## 2020-01-16 DIAGNOSIS — M818 Other osteoporosis without current pathological fracture: Secondary | ICD-10-CM

## 2020-01-16 MED ORDER — DENOSUMAB 60 MG/ML ~~LOC~~ SOSY
60.0000 mg | PREFILLED_SYRINGE | Freq: Once | SUBCUTANEOUS | Status: AC
  Administered 2020-01-16: 60 mg via SUBCUTANEOUS

## 2020-01-16 NOTE — Progress Notes (Signed)
Prolia injection given patient tolerated well.

## 2020-02-08 ENCOUNTER — Other Ambulatory Visit: Payer: Self-pay

## 2020-02-08 ENCOUNTER — Encounter: Payer: Self-pay | Admitting: Gastroenterology

## 2020-02-08 ENCOUNTER — Other Ambulatory Visit (INDEPENDENT_AMBULATORY_CARE_PROVIDER_SITE_OTHER): Payer: Medicare Other

## 2020-02-08 ENCOUNTER — Ambulatory Visit (INDEPENDENT_AMBULATORY_CARE_PROVIDER_SITE_OTHER): Payer: Medicare Other | Admitting: Gastroenterology

## 2020-02-08 VITALS — BP 130/86 | HR 94 | Ht 63.0 in | Wt 129.2 lb

## 2020-02-08 DIAGNOSIS — N179 Acute kidney failure, unspecified: Secondary | ICD-10-CM

## 2020-02-08 DIAGNOSIS — R1013 Epigastric pain: Secondary | ICD-10-CM

## 2020-02-08 NOTE — Progress Notes (Signed)
Chief Complaint: Abdominal pain.  Referring Provider:  Rip Harbour, NP      ASSESSMENT AND PLAN;   #1. Epi pain with nausea. Neg CT AP 09/2018  #2.  New onset ARF. ?Etiology.  I believe her GI symptoms are likely related to acute renal failure.  Plan: -Continue omeprazole 20mg  po qd  -CBC, CMP, Lipase today. -EGD off plavix 5 days in Feb 2022, if still with problems.  Hopefully her renal problems will be sorted out by then. -Decrease bethenacol or stop (RE: salivation) -Appt with Marge Duncans RE: AKF.  Patient will call.   HPI:    Claudia Rasmussen is a 86 y.o. female  H/O C diff 2012, remote TIA on plavix, asymptomatic cholelithiasis, osteoarthritis, HLD, UTI (followed by Dr. Nila Nephew)  C/O nausea since mid Oct 2021 Associated postprandial epi pain/fullness Vomiting x 3 times since mid-October. Does not like to look at food especially eggs-makes her nauseated.  Seen in [place] by Margarito Courser. On 12/19/2019-interestingly her Cr has increased to 2.12 with GFR 21 mL/min (prev - GFR 56 mL/min 7 months ago).  Has been complaining of "salivating a lot".  She has been "craving liquids".   No diarrhea or constipation.  No fever chills or night sweats.  WT loss 4-5 lb  Past GI procedures: CT AP with contrast 09/2018 1. Mild diffuse colonic wall thickening suggesting a nonspecific colitis. There is mild increased haziness in the mesentery of the right lower quadrant. Appendix not clearly identified. 2. Cholelithiasis.  Colonoscopy 04/2009: Colonic polyps s/p polypectomy, moderate predominantly left colonic diverticulosis, internal hemorrhoids. Bx- TA/hyperplastic.  No need to repeat d/t age.  SH: Retired Pharmacist, hospital Past Medical History:  Diagnosis Date  . Age-related osteoporosis without current pathological fracture   . Arthritis   . Atrophy of thyroid (acquired)   . Blepharitis 2021  . Cholelithiases   . Clostridium difficile infection   . Diverticulitis   .  Diverticulitis of large intestine without perforation or abscess without bleeding   . Family history of colon cancer   . Migraine without aura, not intractable, without status migrainosus   . Mixed hyperlipidemia   . Stroke ([place])   . Thyroid disease   . UTI (urinary tract infection)   . Vaginal prolapse   . Vitamin D deficiency, unspecified     Past Surgical History:  Procedure Laterality Date  . ABDOMINAL HYSTERECTOMY    . APPENDECTOMY  1969  . CATARACT EXTRACTION, BILATERAL Bilateral    right- 03/22/2018 and left 05/10/2018  . COLONOSCOPY  05/12/2009   Colonic polyp status post polypectomy. Moderate predominanltly left colonic diverticulosis. Internal hemorrhoids  . OOPHORECTOMY Left     Family History  Problem Relation Age of Onset  . Breast cancer Mother   . Colon cancer Father   . Prostate cancer Brother   . Coronary artery disease Other   . Hypertension Other   . Esophageal cancer Neg Hx     Social History   Tobacco Use  . Smoking status: Former Research scientist (life sciences)  . Smokeless tobacco: Never Used  Vaping Use  . Vaping Use: Never used  Substance Use Topics  . Alcohol use: Yes    Comment: Drinks alcohol very infrequently. She typically consumes white wine.  . Drug use: Never    Current Outpatient Medications  Medication Sig Dispense Refill  . aspirin EC 81 MG tablet Take 81 mg by mouth daily.    . bethanechol (URECHOLINE) 25 MG tablet Take 25 mg by mouth  2 (two) times daily.    . cetirizine (ZYRTEC) 10 MG tablet TAKE 1 TABLET DAILY 90 tablet 3  . Cholecalciferol (VITAMIN D3) 25 MCG (1000 UT) CAPS Take 1,000 Units by mouth daily.    . clopidogrel (PLAVIX) 75 MG tablet TAKE 1 TABLET DAILY 90 tablet 3  . furosemide (LASIX) 20 MG tablet Take 20 mg by mouth daily as needed (Swelling).    Marland Kitchen omeprazole (PRILOSEC) 20 MG capsule Take 1 capsule (20 mg total) by mouth daily. 90 capsule 0  . simvastatin (ZOCOR) 10 MG tablet TAKE 1 TABLET DAILY 90 tablet 3  . SYNTHROID 75 MCG tablet  TAKE 1 TABLET DAILY 90 tablet 3   No current facility-administered medications for this visit.    Allergies  Allergen Reactions  . Shellfish Allergy Other (See Comments)    Itching, Hives Itching, Hives  . Avelox [Moxifloxacin Hcl In Nacl]   . Cephalosporins Other (See Comments)    Itching   . Levaquin [Levofloxacin]   . Azithromycin Rash  . Moxifloxacin Hcl Rash  . Phenobarbital Rash    Review of Systems:  Constitutional: Denies fever, chills, diaphoresis, appetite change and fatigue.  HEENT: Denies photophobia, eye pain, redness, hearing loss, ear pain, congestion, sore throat, rhinorrhea, sneezing, mouth sores, neck pain, neck stiffness and tinnitus.   Respiratory: Denies SOB, DOE, cough, chest tightness,  and wheezing.   Cardiovascular: Denies chest pain, palpitations and leg swelling.  Genitourinary: Denies dysuria, urgency, frequency, hematuria, flank pain and difficulty urinating.  Musculoskeletal: Denies myalgias, back pain, joint swelling, arthralgias and gait problem.  Skin: No rash.  Neurological: Denies dizziness, seizures, syncope, weakness, light-headedness, numbness and headaches.  Hematological: Denies adenopathy. Easy bruising, personal or family bleeding history  Psychiatric/Behavioral: No anxiety or depression     Physical Exam:    BP 130/86   Pulse 94   Ht 5\' 3"  (1.6 m)   Wt 129 lb 4 oz (58.6 kg)   BMI 22.90 kg/m  Wt Readings from Last 3 Encounters:  02/08/20 129 lb 4 oz (58.6 kg)  12/19/19 127 lb (57.6 kg)  09/27/19 130 lb (59 kg)   Constitutional:  Well-developed, in no acute distress. Psychiatric: Normal mood and affect. Behavior is normal. HEENT: Pupils normal.  Conjunctivae are normal. No scleral icterus. Neck supple.  Cardiovascular: Normal rate, regular rhythm. No edema Pulmonary/chest: Effort normal and breath sounds normal. No wheezing, rales or rhonchi. Abdominal: Soft, nondistended. Nontender. Bowel sounds active throughout. There are  no masses palpable. No hepatomegaly. Rectal: Deferred Neurological: Alert and oriented to person place and time. Skin: Skin is warm and dry. No rashes noted.  Data Reviewed: I have personally reviewed following labs and imaging studies  CBC: CBC Latest Ref Rng & Units 12/19/2019 06/19/2019 09/29/2018  WBC 3.4 - 10.8 x10E3/uL 7.3 6.4 11.1(H)  Hemoglobin 11.1 - 15.9 g/dL 12.8 14.8 14.2  Hematocrit 34.0 - 46.6 % 37.4 44.4 43.4  Platelets 150 - 450 x10E3/uL 218 221 199    CMP: CMP Latest Ref Rng & Units 12/19/2019 06/19/2019 09/29/2018  Glucose 65 - 99 mg/dL 93 96 126(H)  BUN 8 - 27 mg/dL 37(H) 24 21  Creatinine 0.57 - 1.00 mg/dL 2.12(H) 0.93 0.93  Sodium 134 - 144 mmol/L 141 141 139  Potassium 3.5 - 5.2 mmol/L 4.7 4.8 4.2  Chloride 96 - 106 mmol/L 107(H) 106 105  CO2 20 - 29 mmol/L 21 21 21(L)  Calcium 8.7 - 10.3 mg/dL 9.7 9.7 9.5  Total Protein 6.0 - 8.5  g/dL 7.0 6.8 7.2  Total Bilirubin 0.0 - 1.2 mg/dL 0.8 1.1 1.8(H)  Alkaline Phos 44 - 121 IU/L 108 86 79  AST 0 - 40 IU/L 30 23 29   ALT 0 - 32 IU/L 31 20 24       Carmell Austria, MD 02/08/2020, 1:56 PM  Cc: Rip Harbour, NP

## 2020-02-08 NOTE — Patient Instructions (Signed)
If you are age 84 or older, your body mass index should be between 23-30. Your Body mass index is 22.9 kg/m. If this is out of the aforementioned range listed, please consider follow up with your Primary Care Provider.  If you are age 50 or younger, your body mass index should be between 19-25. Your Body mass index is 22.9 kg/m. If this is out of the aformentioned range listed, please consider follow up with your Primary Care Provider.   It has been recommended to you by your physician that you have a(n) endoscopy completed. Per your request, we did not schedule the procedure(s) today. Please contact our office at [phone] should you decide to have the procedure completed.   Please go to the second floor, [facility] [address] to schedule labwork.  Due to recent changes in healthcare laws, you may see the results of your imaging and laboratory studies on MyChart before your provider has had a chance to review them.  We understand that in some cases there may be results that are confusing or concerning to you. Not all laboratory results come back in the same time frame and the provider may be waiting for multiple results in order to interpret others.  Please give [place] 48 hours in order for your provider to thoroughly review all the results before contacting the office for clarification of your results.   It was a pleasure to see you today!  Jackquline Denmark, M.D.

## 2020-02-09 LAB — COMPREHENSIVE METABOLIC PANEL
AG Ratio: 1.5 (calc) (ref 1.0–2.5)
ALT: 18 U/L (ref 6–29)
AST: 20 U/L (ref 10–35)
Albumin: 4.2 g/dL (ref 3.6–5.1)
Alkaline phosphatase (APISO): 69 U/L (ref 37–153)
BUN/Creatinine Ratio: 25 (calc) — ABNORMAL HIGH (ref 6–22)
BUN: 43 mg/dL — ABNORMAL HIGH (ref 7–25)
CO2: 21 mmol/L (ref 20–32)
Calcium: 9 mg/dL (ref 8.6–10.4)
Chloride: 110 mmol/L (ref 98–110)
Creat: 1.71 mg/dL — ABNORMAL HIGH (ref 0.60–0.88)
Globulin: 2.8 g/dL (calc) (ref 1.9–3.7)
Glucose, Bld: 91 mg/dL (ref 65–99)
Potassium: 4.9 mmol/L (ref 3.5–5.3)
Sodium: 140 mmol/L (ref 135–146)
Total Bilirubin: 0.8 mg/dL (ref 0.2–1.2)
Total Protein: 7 g/dL (ref 6.1–8.1)

## 2020-02-09 LAB — CBC
HCT: 35.2 % (ref 35.0–45.0)
Hemoglobin: 11.9 g/dL (ref 11.7–15.5)
MCH: 31.2 pg (ref 27.0–33.0)
MCHC: 33.8 g/dL (ref 32.0–36.0)
MCV: 92.4 fL (ref 80.0–100.0)
MPV: 10.9 fL (ref 7.5–12.5)
Platelets: 241 10*3/uL (ref 140–400)
RBC: 3.81 10*6/uL (ref 3.80–5.10)
RDW: 12.6 % (ref 11.0–15.0)
WBC: 7.1 10*3/uL (ref 3.8–10.8)

## 2020-02-09 LAB — LIPASE: Lipase: 107 U/L — ABNORMAL HIGH (ref 7–60)

## 2020-02-12 ENCOUNTER — Encounter: Payer: Self-pay | Admitting: Physician Assistant

## 2020-02-12 ENCOUNTER — Ambulatory Visit (INDEPENDENT_AMBULATORY_CARE_PROVIDER_SITE_OTHER): Payer: Medicare Other | Admitting: Physician Assistant

## 2020-02-12 ENCOUNTER — Other Ambulatory Visit: Payer: Self-pay

## 2020-02-12 VITALS — BP 122/72 | HR 92 | Temp 97.6°F | Ht 63.0 in | Wt 128.0 lb

## 2020-02-12 DIAGNOSIS — R1013 Epigastric pain: Secondary | ICD-10-CM | POA: Diagnosis not present

## 2020-02-12 DIAGNOSIS — R944 Abnormal results of kidney function studies: Secondary | ICD-10-CM | POA: Diagnosis not present

## 2020-02-12 DIAGNOSIS — N3001 Acute cystitis with hematuria: Secondary | ICD-10-CM | POA: Diagnosis not present

## 2020-02-12 DIAGNOSIS — R350 Frequency of micturition: Secondary | ICD-10-CM | POA: Diagnosis not present

## 2020-02-12 LAB — POCT URINALYSIS DIPSTICK
Bilirubin, UA: NEGATIVE
Blood, UA: POSITIVE
Glucose, UA: NEGATIVE
Ketones, UA: NEGATIVE
Nitrite, UA: POSITIVE
Protein, UA: POSITIVE — AB
Spec Grav, UA: 1.02 (ref 1.010–1.025)
Urobilinogen, UA: 0.2 U/dL
pH, UA: 6 (ref 5.0–8.0)

## 2020-02-12 NOTE — Progress Notes (Signed)
Acute Office Visit  Subjective:    Patient ID: Claudia Rasmussen, female    DOB: 05/19/1931, 86 y.o.   MRN: [id number]  Chief Complaint  Patient presents with   Abnormal renal function labwork   History of recurrent UTI Nausea and epigastric pain    HPI Patient is in today for follow up of abnormal renal functions - pt was seen in our office in October by another provider - had cbc, cmp and Hpylori labwork done --- the CMP actually showed abnormal kidney function (which was new for her) and other labwork normal Unfortunately that labwork was never looked at/evaluated by provider 12/19/19 - BUN 37 and creatinine 2.12 02/08/2020 - BUN 43 and creatinine 1.71  Pt was seen by Dr Lyndel Safe for GI issues --- he repeated the labwork a few days ago and it is noted that the renal function is still abnormal and referred her back to [place] to send to nephrology for further evaluation and treatment Per pt Dr Lyndel Safe is holding off on further evaluation of her GI issues until she sees nephrology - she states that if that appointment does not reveal anything then Lyndel Safe will proceed with upper endoscopy - per pt she states that Lyndel Safe stated no need to worry about her gallbladder causing her issues  Pt with history of chronic UTIs which have been recurrent - she has followed with Dr Nila Nephew for these issues - she had been on nitrofur 50mg  daily and he had changed the dose to 100mg  tid -- she only took a few of these pills but could not tolerate and stopped the medication She states she has not taken any of these in over a month Pt denies urinary frequency, urgency or dysuria --- I did tell her that her symptoms of nausea and stomach upset could be symptoms for a uti as well Pt very concerned about starting antibiotics due to her prior history of Cdiff and the fact she has had several interactions with medications in the past  Past Medical History:  Diagnosis Date   Age-related osteoporosis without current  pathological fracture    Arthritis    Atrophy of thyroid (acquired)    Blepharitis 2021   Cholelithiases    Clostridium difficile infection    Diverticulitis    Diverticulitis of large intestine without perforation or abscess without bleeding    Family history of colon cancer    Migraine without aura, not intractable, without status migrainosus    Mixed hyperlipidemia    Stroke [facility])    Thyroid disease    UTI (urinary tract infection)    Vaginal prolapse    Vitamin D deficiency, unspecified     Past Surgical History:  Procedure Laterality Date   ABDOMINAL HYSTERECTOMY     APPENDECTOMY  1969   CATARACT EXTRACTION, BILATERAL Bilateral    right- 03/22/2018 and left 05/10/2018   COLONOSCOPY  05/12/2009   Colonic polyp status post polypectomy. Moderate predominanltly left colonic diverticulosis. Internal hemorrhoids   OOPHORECTOMY Left     Family History  Problem Relation Age of Onset   Breast cancer Mother    Colon cancer Father    Prostate cancer Brother    Coronary artery disease Other    Hypertension Other    Esophageal cancer Neg Hx     Social History   Socioeconomic History   Marital status: Widowed    Spouse name: Not on file   Number of children: 3   Years of education: Not on file  Highest education level: Not on file  Occupational History   Not on file  Tobacco Use   Smoking status: Former Smoker   Smokeless tobacco: Never Used  Scientific laboratory technician Use: Never used  Substance and Sexual Activity   Alcohol use: Yes    Comment: Drinks alcohol very infrequently. She typically consumes white wine.   Drug use: Never   Sexual activity: Not on file  Other Topics Concern   Not on file  Social History Narrative   Not on file   Social Determinants of Health   Financial Resource Strain: Not on file  Food Insecurity: Not on file  Transportation Needs: Not on file  Physical Activity: Not on file  Stress: Not on file   Social Connections: Not on file  Intimate Partner Violence: Not on file    Outpatient Medications Prior to Visit  Medication Sig Dispense Refill   aspirin EC 81 MG tablet Take 81 mg by mouth daily.     bethanechol (URECHOLINE) 25 MG tablet Take 25 mg by mouth 2 (two) times daily.     cetirizine (ZYRTEC) 10 MG tablet TAKE 1 TABLET DAILY 90 tablet 3   Cholecalciferol (VITAMIN D3) 25 MCG (1000 UT) CAPS Take 1,000 Units by mouth daily.     clopidogrel (PLAVIX) 75 MG tablet TAKE 1 TABLET DAILY 90 tablet 3   omeprazole (PRILOSEC) 20 MG capsule Take 1 capsule (20 mg total) by mouth daily. 90 capsule 0   simvastatin (ZOCOR) 10 MG tablet TAKE 1 TABLET DAILY 90 tablet 3   SYNTHROID 75 MCG tablet TAKE 1 TABLET DAILY 90 tablet 3   furosemide (LASIX) 20 MG tablet Take 20 mg by mouth daily as needed (Swelling).     nitrofurantoin (MACRODANTIN) 100 MG capsule Take by mouth.     No facility-administered medications prior to visit.    Allergies  Allergen Reactions   Shellfish Allergy Other (See Comments)    Itching, Hives Itching, Hives   Avelox [Moxifloxacin Hcl In Nacl]    Cephalosporins Other (See Comments)    Itching    Levaquin [Levofloxacin]    Azithromycin Rash   Moxifloxacin Hcl Rash   Phenobarbital Rash    Review of Systems CONSTITUTIONAL: Negative for chills, fatigue, fever, unintentional weight gain and unintentional weight loss.  E/N/T: Negative for ear pain, nasal congestion and sore throat.  CARDIOVASCULAR: Negative for chest pain, dizziness, palpitations and pedal edema.  RESPIRATORY: Negative for recent cough and dyspnea.  GASTROINTESTINAL: see HPI MSK: Negative for arthralgias and myalgias.  GU - negative INTEGUMENTARY: Negative for rash.  NEUROLOGICAL: Negative for dizziness and headaches.  PSYCHIATRIC: Negative for sleep disturbance and to question depression screen.  Negative for depression, negative for anhedonia.         Objective:    Physical  Exam PHYSICAL EXAM:   VS: BP 122/72 (BP Location: Right Arm, Patient Position: Sitting, Cuff Size: Small)    Pulse 92    Temp 97.6 F (36.4 C) (Temporal)    Ht 5\' 3"  (1.6 m)    Wt 128 lb (58.1 kg)    SpO2 94%    BMI 22.67 kg/m   GEN: Well nourished, well developed, in no acute distress  Cardiac: RRR; no murmurs, rubs, or gallops,no edema -  Respiratory:  normal respiratory rate and pattern with no distress - normal breath sounds with no rales, rhonchi, wheezes or rubs GI: normal bowel sounds, no masses or tenderness Skin: warm and dry, no rash  Psych:  euthymic mood, appropriate affect and demeanor  Office Visit on 02/12/2020  Component Date Value Ref Range Status   Glucose, UA 02/12/2020 Negative  Negative Final   Bilirubin, UA 02/12/2020 Negative   Final   Ketones, UA 02/12/2020 Negative   Final   Spec Grav, UA 02/12/2020 1.020  1.010 - 1.025 Final   Blood, UA 02/12/2020 Positive   Final   pH, UA 02/12/2020 6.0  5.0 - 8.0 Final   Protein, UA 02/12/2020 Positive* Negative Final   Urobilinogen, UA 02/12/2020 0.2  0.2 or 1.0 E.U./dL Final   Nitrite, UA 02/12/2020 Positive   Final   Leukocytes, UA 02/12/2020 Large (3+)* Negative Final    BP 122/72 (BP Location: Right Arm, Patient Position: Sitting, Cuff Size: Small)    Pulse 92    Temp 97.6 F (36.4 C) (Temporal)    Ht 5\' 3"  (1.6 m)    Wt 128 lb (58.1 kg)    SpO2 94%    BMI 22.67 kg/m  Wt Readings from Last 3 Encounters:  02/12/20 128 lb (58.1 kg)  02/08/20 129 lb 4 oz (58.6 kg)  12/19/19 127 lb (57.6 kg)    Health Maintenance Due  Topic Date Due   TETANUS/TDAP  Never done   COVID-19 Vaccine (3 - Booster for Moderna series) 09/17/2019    There are no preventive care reminders to display for this patient.   Lab Results  Component Value Date   TSH 2.570 06/19/2019   Lab Results  Component Value Date   WBC 7.1 02/08/2020   HGB 11.9 02/08/2020   HCT 35.2 02/08/2020   MCV 92.4 02/08/2020   PLT 241  02/08/2020   Lab Results  Component Value Date   NA 140 02/08/2020   K 4.9 02/08/2020   CO2 21 02/08/2020   GLUCOSE 91 02/08/2020   BUN 43 (H) 02/08/2020   CREATININE 1.71 (H) 02/08/2020   BILITOT 0.8 02/08/2020   ALKPHOS 108 12/19/2019   AST 20 02/08/2020   ALT 18 02/08/2020   PROT 7.0 02/08/2020   ALBUMIN 4.2 12/19/2019   CALCIUM 9.0 02/08/2020   ANIONGAP 13 09/29/2018   Lab Results  Component Value Date   CHOL 178 06/19/2019   Lab Results  Component Value Date   HDL 78 06/19/2019   Lab Results  Component Value Date   LDLCALC 86 06/19/2019   Lab Results  Component Value Date   TRIG 75 06/19/2019   Lab Results  Component Value Date   CHOLHDL 2.3 06/19/2019   No results found for: HGBA1C     Assessment & Plan:  1. Abnormal renal function test - Ambulatory referral to Nephrology  2. Acute cystitis with hematuria - Urine Culture - POCT urinalysis dipstick Will wait on culture results to treat since pt asymptomatic and issues with meds 3. Epigastric pain  continue follow up and evaluation with Dr Lyndel Safe  No orders of the defined types were placed in this encounter.   Orders Placed This Encounter  Procedures   Urine Culture   Ambulatory referral to Nephrology   POCT urinalysis dipstick      Follow-up: No follow-ups on file.  An After Visit Summary was printed and given to the patient.  Yetta Flock [facility] [phone]

## 2020-02-18 LAB — URINE CULTURE

## 2020-02-20 ENCOUNTER — Other Ambulatory Visit: Payer: Self-pay | Admitting: Physician Assistant

## 2020-02-20 ENCOUNTER — Other Ambulatory Visit: Payer: Self-pay

## 2020-02-20 MED ORDER — DOXYCYCLINE HYCLATE 100 MG PO TABS: 100.0000 mg | ORAL_TABLET | Freq: Two times a day (BID) | ORAL | 0 refills | Status: DC

## 2020-03-04 ENCOUNTER — Ambulatory Visit (INDEPENDENT_AMBULATORY_CARE_PROVIDER_SITE_OTHER): Payer: Medicare Other | Admitting: Physician Assistant

## 2020-03-04 ENCOUNTER — Encounter: Payer: Self-pay | Admitting: Physician Assistant

## 2020-03-04 ENCOUNTER — Other Ambulatory Visit: Payer: Self-pay

## 2020-03-04 VITALS — BP 122/68 | HR 94 | Temp 95.4°F | Resp 16 | Ht 63.0 in | Wt 126.8 lb

## 2020-03-04 DIAGNOSIS — R1013 Epigastric pain: Secondary | ICD-10-CM

## 2020-03-04 DIAGNOSIS — R944 Abnormal results of kidney function studies: Secondary | ICD-10-CM | POA: Diagnosis not present

## 2020-03-04 DIAGNOSIS — R110 Nausea: Secondary | ICD-10-CM | POA: Diagnosis not present

## 2020-03-04 DIAGNOSIS — N3000 Acute cystitis without hematuria: Secondary | ICD-10-CM

## 2020-03-04 LAB — POCT URINALYSIS DIP (CLINITEK)
Bilirubin, UA: NEGATIVE
Blood, UA: NEGATIVE
Glucose, UA: NEGATIVE mg/dL
Ketones, POC UA: NEGATIVE mg/dL
Leukocytes, UA: NEGATIVE
Nitrite, UA: NEGATIVE
Spec Grav, UA: 1.025 (ref 1.010–1.025)
Urobilinogen, UA: 0.2 E.U./dL
pH, UA: 5.5 (ref 5.0–8.0)

## 2020-03-04 NOTE — Progress Notes (Signed)
Acute Office Visit  Subjective:    Patient ID: Claudia Rasmussen, female    DOB: 11-06-1931, 87 y.o.   MRN: [id number]  Chief Complaint  Patient presents with  . Abnormal renal function labwork  . History of recurrent UTI Nausea and epigastric pain    HPI Patient is in today for follow up of abnormal renal functions - pt was seen in our office in October by another provider - had cbc, cmp and Hpylori labwork done --- the CMP actually showed abnormal kidney function (which was new for her) and other labwork normal 12/19/19 - BUN 37 and creatinine 2.12 02/08/2020 - BUN 43 and creatinine 1.71 She has been made an appointment with nephrologist and that appt is on 1/26 Recommend to repeat cmp today  Pt was seen by Dr Lyndel Safe for GI issues ---  Per pt Dr Lyndel Safe is holding off on further evaluation of her GI issues until she sees nephrology - she states that if that appointment does not reveal anything then Lyndel Safe will proceed with upper endoscopy - however pt continues to have nausea and epigastric pain - she has had a 2 pound weight loss since last visit Recommend we repeat cmp as well as get amylase and lipase then will contact Lyndel Safe with results and go ahead and get patient scheduled  Pt with history of chronic UTIs which have been recurrent - she has followed with Dr Nila Nephew for these issues - she did have a positive urine culture at last visit and completed 9 days of macrobid (vomited 2 pills) She denies urine symptoms today  Past Medical History:  Diagnosis Date  . Age-related osteoporosis without current pathological fracture   . Arthritis   . Atrophy of thyroid (acquired)   . Blepharitis 2021  . Cholelithiases   . Clostridium difficile infection   . Diverticulitis   . Diverticulitis of large intestine without perforation or abscess without bleeding   . Family history of colon cancer   . Migraine without aura, not intractable, without status migrainosus   . Mixed hyperlipidemia   .  Stroke ([place])   . Thyroid disease   . UTI (urinary tract infection)   . Vaginal prolapse   . Vitamin D deficiency, unspecified     Past Surgical History:  Procedure Laterality Date  . ABDOMINAL HYSTERECTOMY    . APPENDECTOMY  1969  . CATARACT EXTRACTION, BILATERAL Bilateral    right- 03/22/2018 and left 05/10/2018  . COLONOSCOPY  05/12/2009   Colonic polyp status post polypectomy. Moderate predominanltly left colonic diverticulosis. Internal hemorrhoids  . OOPHORECTOMY Left     Family History  Problem Relation Age of Onset  . Breast cancer Mother   . Colon cancer Father   . Prostate cancer Brother   . Coronary artery disease Other   . Hypertension Other   . Esophageal cancer Neg Hx     Social History   Socioeconomic History  . Marital status: Widowed    Spouse name: Not on file  . Number of children: 3  . Years of education: Not on file  . Highest education level: Not on file  Occupational History  . Not on file  Tobacco Use  . Smoking status: Former Research scientist (life sciences)  . Smokeless tobacco: Never Used  Vaping Use  . Vaping Use: Never used  Substance and Sexual Activity  . Alcohol use: Yes    Comment: Drinks alcohol very infrequently. She typically consumes white wine.  . Drug use: Never  . Sexual activity:  Not on file  Other Topics Concern  . Not on file  Social History Narrative  . Not on file   Social Determinants of Health   Financial Resource Strain: Not on file  Food Insecurity: Not on file  Transportation Needs: Not on file  Physical Activity: Not on file  Stress: Not on file  Social Connections: Not on file  Intimate Partner Violence: Not on file    Outpatient Medications Prior to Visit  Medication Sig Dispense Refill  . aspirin EC 81 MG tablet Take 81 mg by mouth daily.    . bethanechol (URECHOLINE) 25 MG tablet Take 25 mg by mouth 2 (two) times daily.    . cetirizine (ZYRTEC) 10 MG tablet TAKE 1 TABLET DAILY 90 tablet 3  . Cholecalciferol (VITAMIN D3)  25 MCG (1000 UT) CAPS Take 1,000 Units by mouth daily.    . clopidogrel (PLAVIX) 75 MG tablet TAKE 1 TABLET DAILY 90 tablet 3  . omeprazole (PRILOSEC) 20 MG capsule Take 1 capsule (20 mg total) by mouth daily. 90 capsule 0  . simvastatin (ZOCOR) 10 MG tablet TAKE 1 TABLET DAILY 90 tablet 3  . SYNTHROID 75 MCG tablet TAKE 1 TABLET DAILY 90 tablet 3  . doxycycline (VIBRA-TABS) 100 MG tablet Take 1 tablet (100 mg total) by mouth 2 (two) times daily. 20 tablet 0   No facility-administered medications prior to visit.    Allergies  Allergen Reactions  . Shellfish Allergy Other (See Comments)    Itching, Hives Itching, Hives  . Avelox [Moxifloxacin Hcl In Nacl]   . Cephalosporins Other (See Comments)    Itching   . Levaquin [Levofloxacin]   . Azithromycin Rash  . Moxifloxacin Hcl Rash  . Phenobarbital Rash    Review of Systems CONSTITUTIONAL: Negative for chills, fatigue, fever, unintentional weight gain and unintentional weight loss.  CARDIOVASCULAR: Negative for chest pain, dizziness, palpitations and pedal edema.  RESPIRATORY: Negative for recent cough and dyspnea.  GASTROINTESTINAL: see HPI MSK: Negative for arthralgias and myalgias.  GU - negative INTEGUMENTARY: Negative for rash.  PSYCHIATRIC: Negative for sleep disturbance and to question depression screen.  Negative for depression, negative for anhedonia.         Objective:    Physical Exam PHYSICAL EXAM:   VS: BP 122/68 (BP Location: Left Arm, Patient Position: Sitting, Cuff Size: Normal)   Pulse 94   Temp (!) 95.4 F (35.2 C) (Temporal)   Resp 16   Ht 5\' 3"  (1.6 m)   Wt 126 lb 12.8 oz (57.5 kg)   SpO2 100%   BMI 22.46 kg/m   GEN: Well nourished, well developed, in no acute distress  Cardiac: RRR; no murmurs, rubs, or gallops,no edema -  Respiratory:  normal respiratory rate and pattern with no distress - normal breath sounds with no rales, rhonchi, wheezes or rubs Skin: warm and dry, no rash  Psych: euthymic  mood, appropriate affect and demeanor  No visits with results within 1 Day(s) from this visit.  Latest known visit with results is:  Office Visit on 02/12/2020  Component Date Value Ref Range Status  . Urine Culture, Routine 02/12/2020 Final report*  Final  . Organism ID, Bacteria 02/12/2020 Escherichia coli*  Final   Comment: Cefazolin <=4 ug/mL Cefazolin with an MIC <=16 predicts susceptibility to the oral agents cefaclor, cefdinir, cefpodoxime, cefprozil, cefuroxime, cephalexin, and loracarbef when used for therapy of uncomplicated urinary tract infections due to E. coli, Klebsiella pneumoniae, and Proteus mirabilis. Greater than 100,000 colony  forming units per mL   . Antimicrobial Susceptibility 02/12/2020 Comment   Final   Comment:       ** S = Susceptible; I = Intermediate; R = Resistant **                    P = Positive; N = Negative             MICS are expressed in micrograms per mL    Antibiotic                 RSLT#1    RSLT#2    RSLT#3    RSLT#4 Amoxicillin/Clavulanic Acid    S Ampicillin                     S Cefepime                       S Ceftriaxone                    S Cefuroxime                     S Ciprofloxacin                  S Ertapenem                      S Gentamicin                     S Imipenem                       S Levofloxacin                   S Meropenem                      S Nitrofurantoin                 S Piperacillin/Tazobactam        S Tetracycline                   S Tobramycin                     S Trimethoprim/Sulfa             S   . Glucose, UA 02/12/2020 Negative  Negative Final  . Bilirubin, UA 02/12/2020 Negative   Final  . Ketones, UA 02/12/2020 Negative   Final  . Spec Grav, UA 02/12/2020 1.020  1.010 - 1.025 Final  . Blood, UA 02/12/2020 Positive   Final  . pH, UA 02/12/2020 6.0  5.0 - 8.0 Final  . Protein, UA 02/12/2020 Positive* Negative Final  . Urobilinogen, UA 02/12/2020 0.2  0.2 or 1.0 E.U./dL Final  . Nitrite,  UA 02/12/2020 Positive   Final  . Leukocytes, UA 02/12/2020 Large (3+)* Negative Final    BP 122/68 (BP Location: Left Arm, Patient Position: Sitting, Cuff Size: Normal)   Pulse 94   Temp (!) 95.4 F (35.2 C) (Temporal)   Resp 16   Ht 5\' 3"  (1.6 m)   Wt 126 lb 12.8 oz (57.5 kg)   SpO2 100%   BMI 22.46 kg/m  Wt Readings from Last 3 Encounters:  03/04/20 126 lb 12.8 oz (57.5 kg)  02/12/20 128 lb (58.1 kg)  02/08/20 129  lb 4 oz (58.6 kg)    Health Maintenance Due  Topic Date Due  . TETANUS/TDAP  Never done  . COVID-19 Vaccine (3 - Booster for Moderna series) 09/17/2019    There are no preventive care reminders to display for this patient.   Lab Results  Component Value Date   TSH 2.570 06/19/2019   Lab Results  Component Value Date   WBC 7.1 02/08/2020   HGB 11.9 02/08/2020   HCT 35.2 02/08/2020   MCV 92.4 02/08/2020   PLT 241 02/08/2020   Lab Results  Component Value Date   NA 140 02/08/2020   K 4.9 02/08/2020   CO2 21 02/08/2020   GLUCOSE 91 02/08/2020   BUN 43 (H) 02/08/2020   CREATININE 1.71 (H) 02/08/2020   BILITOT 0.8 02/08/2020   ALKPHOS 108 12/19/2019   AST 20 02/08/2020   ALT 18 02/08/2020   PROT 7.0 02/08/2020   ALBUMIN 4.2 12/19/2019   CALCIUM 9.0 02/08/2020   ANIONGAP 13 09/29/2018   Lab Results  Component Value Date   CHOL 178 06/19/2019   Lab Results  Component Value Date   HDL 78 06/19/2019   Lab Results  Component Value Date   LDLCALC 86 06/19/2019   Lab Results  Component Value Date   TRIG 75 06/19/2019   Lab Results  Component Value Date   CHOLHDL 2.3 06/19/2019   No results found for: HGBA1C     Assessment & Plan:  1. Abnormal renal function test - repeat cmp today and follow up with nephrologist as directed  2. Acute cystitis with hematuria- resolved but because of history will culture again - Urine Culture - POCT urinalysis dipstick Will wait on culture results to treat since pt asymptomatic and issues with  meds 3. Epigastric pain  continue follow up and evaluation with Dr Lyndel Safe-  Amylase and lipase pending No orders of the defined types were placed in this encounter.   Orders Placed This Encounter  Procedures  . Urine Culture  . CBC with Differential/Platelet  . Comprehensive metabolic panel  . Amylase  . Lipase  . POCT URINALYSIS DIP (CLINITEK)      Follow-up: Return for pending labwork.  An After Visit Summary was printed and given to the patient.  Yetta Flock [facility] [phone]

## 2020-03-05 ENCOUNTER — Telehealth: Payer: Self-pay

## 2020-03-05 ENCOUNTER — Encounter: Payer: Self-pay | Admitting: Physician Assistant

## 2020-03-05 DIAGNOSIS — G459 Transient cerebral ischemic attack, unspecified: Secondary | ICD-10-CM | POA: Insufficient documentation

## 2020-03-05 LAB — COMPREHENSIVE METABOLIC PANEL
ALT: 17 IU/L (ref 0–32)
AST: 20 IU/L (ref 0–40)
Albumin/Globulin Ratio: 1.6 (ref 1.2–2.2)
Albumin: 4.2 g/dL (ref 3.6–4.6)
Alkaline Phosphatase: 84 IU/L (ref 44–121)
BUN/Creatinine Ratio: 23 (ref 12–28)
BUN: 39 mg/dL — ABNORMAL HIGH (ref 8–27)
Bilirubin Total: 1 mg/dL (ref 0.0–1.2)
CO2: 19 mmol/L — ABNORMAL LOW (ref 20–29)
Calcium: 9.3 mg/dL (ref 8.7–10.3)
Chloride: 112 mmol/L — ABNORMAL HIGH (ref 96–106)
Creatinine, Ser: 1.7 mg/dL — ABNORMAL HIGH (ref 0.57–1.00)
GFR calc Af Amer: 31 mL/min/{1.73_m2} — ABNORMAL LOW (ref >59)
GFR calc non Af Amer: 27 mL/min/{1.73_m2} — ABNORMAL LOW (ref >59)
Globulin, Total: 2.7 g/dL (ref 1.5–4.5)
Glucose: 86 mg/dL (ref 65–99)
Potassium: 5.3 mmol/L — ABNORMAL HIGH (ref 3.5–5.2)
Sodium: 144 mmol/L (ref 134–144)
Total Protein: 6.9 g/dL (ref 6.0–8.5)

## 2020-03-05 LAB — CBC WITH DIFFERENTIAL/PLATELET
Basophils Absolute: 0.1 10*3/uL (ref 0.0–0.2)
Basos: 2 %
EOS (ABSOLUTE): 0.1 10*3/uL (ref 0.0–0.4)
Eos: 2 %
Hematocrit: 36.4 % (ref 34.0–46.6)
Hemoglobin: 11.9 g/dL (ref 11.1–15.9)
Immature Grans (Abs): 0 10*3/uL (ref 0.0–0.1)
Immature Granulocytes: 0 %
Lymphocytes Absolute: 2 10*3/uL (ref 0.7–3.1)
Lymphs: 33 %
MCH: 30.1 pg (ref 26.6–33.0)
MCHC: 32.7 g/dL (ref 31.5–35.7)
MCV: 92 fL (ref 79–97)
Monocytes Absolute: 0.5 10*3/uL (ref 0.1–0.9)
Monocytes: 9 %
Neutrophils Absolute: 3.3 10*3/uL (ref 1.4–7.0)
Neutrophils: 54 %
Platelets: 199 10*3/uL (ref 150–450)
RBC: 3.95 x10E6/uL (ref 3.77–5.28)
RDW: 13 % (ref 11.7–15.4)
WBC: 6 10*3/uL (ref 3.4–10.8)

## 2020-03-05 LAB — LIPASE: Lipase: 89 U/L — ABNORMAL HIGH (ref 14–85)

## 2020-03-05 LAB — AMYLASE: Amylase: 105 U/L (ref 31–110)

## 2020-03-05 NOTE — Telephone Encounter (Signed)
Patient notified to hold Plavix for 5 days prior to EGD per Marge Duncans PA. Patient voiced understanding.

## 2020-03-05 NOTE — Telephone Encounter (Signed)
[facility] Pre-operative Risk Assessment     Request for surgical clearance:     Endoscopy Procedure  What type of surgery is being performed?     EGD  When is this surgery scheduled?     03/18/20  What type of clearance is required ?   Pharmacy  Are there any medications that need to be held prior to surgery and how long? Plavix 5 day hold  Practice name and name of physician performing surgery?      [facility]  What is your office phone and fax number?      Phone- [phone]  Fax463 [phone]  Anesthesia type (None, local, MAC, general) ?       MAC

## 2020-03-05 NOTE — Telephone Encounter (Signed)
OK to hold plavix for 5 days.

## 2020-03-06 LAB — URINE CULTURE: Organism ID, Bacteria: NO GROWTH

## 2020-03-12 ENCOUNTER — Other Ambulatory Visit: Payer: Self-pay

## 2020-03-12 ENCOUNTER — Telehealth: Payer: Self-pay | Admitting: *Deleted

## 2020-03-12 ENCOUNTER — Ambulatory Visit (AMBULATORY_SURGERY_CENTER): Payer: Medicare Other | Admitting: *Deleted

## 2020-03-12 VITALS — Ht 63.0 in | Wt 125.0 lb

## 2020-03-12 DIAGNOSIS — R1013 Epigastric pain: Secondary | ICD-10-CM

## 2020-03-12 NOTE — Telephone Encounter (Signed)
Virtual pre visit completed.

## 2020-03-12 NOTE — Progress Notes (Addendum)
No egg or soy allergy known to patient  No issues with past sedation with any surgeries or procedures No intubation problems in the past  No FH of Malignant Hyperthermia No diet pills per patient No home 02 use per patient  No blood thinners per patient  Pt denies issues with constipation  No A fib or A flutter  EMMI video to pt or via [place] 19 guidelines implemented in PV today with Pt and RN  Pt is fully vaccinated  for [employer] conducted.  Instructions mailed to patient.  Due to the COVID-19 pandemic we are asking patients to follow certain guidelines.  Pt aware of COVID protocols and [facility] guidelines

## 2020-03-18 ENCOUNTER — Ambulatory Visit (AMBULATORY_SURGERY_CENTER): Payer: Medicare Other | Admitting: Gastroenterology

## 2020-03-18 ENCOUNTER — Other Ambulatory Visit: Payer: Self-pay

## 2020-03-18 ENCOUNTER — Encounter: Payer: Self-pay | Admitting: Gastroenterology

## 2020-03-18 VITALS — BP 135/96 | HR 81 | Temp 97.2°F | Resp 17 | Ht 63.0 in | Wt 125.0 lb

## 2020-03-18 DIAGNOSIS — R1013 Epigastric pain: Secondary | ICD-10-CM

## 2020-03-18 DIAGNOSIS — K2970 Gastritis, unspecified, without bleeding: Secondary | ICD-10-CM

## 2020-03-18 DIAGNOSIS — K449 Diaphragmatic hernia without obstruction or gangrene: Secondary | ICD-10-CM

## 2020-03-18 DIAGNOSIS — K222 Esophageal obstruction: Secondary | ICD-10-CM | POA: Diagnosis not present

## 2020-03-18 DIAGNOSIS — R109 Unspecified abdominal pain: Secondary | ICD-10-CM | POA: Diagnosis not present

## 2020-03-18 DIAGNOSIS — R112 Nausea with vomiting, unspecified: Secondary | ICD-10-CM | POA: Diagnosis not present

## 2020-03-18 MED ORDER — SODIUM CHLORIDE 0.9 % IV SOLN: 500.0000 mL | Freq: Once | INTRAVENOUS | Status: DC

## 2020-03-18 MED ORDER — ONDANSETRON 4 MG PO TBDP: 4.0000 mg | ORAL_TABLET | Freq: Three times a day (TID) | ORAL | 0 refills | Status: DC | PRN

## 2020-03-18 NOTE — Progress Notes (Signed)
Called to room to assist during endoscopic procedure.  Patient ID and intended procedure confirmed with present staff. Received instructions for my participation in the procedure from the performing physician.  

## 2020-03-18 NOTE — Progress Notes (Signed)
Pt's states no medical or surgical changes since previsit or office visit. 

## 2020-03-18 NOTE — Patient Instructions (Signed)
HANDOUTS PROVIDED ON: HIATAL HERNIA & GASTRITIS  Thebiopsies taken today have been sent for pathology.  The results can take 1-3 weeks to receive.    You may resume your previous diet and medication schedule.  RESUME YOUR PLAVIX (clopidogral) ON 03/20/2020.  Thank you for allowing [place] to care for you today!!!   YOU HAD AN ENDOSCOPIC PROCEDURE TODAY AT [facility]:   Refer to the procedure report that was given to you for any specific questions about what was found during the examination.  If the procedure report does not answer your questions, please call your gastroenterologist to clarify.  If you requested that your care partner not be given the details of your procedure findings, then the procedure report has been included in a sealed envelope for you to review at your convenience later.  YOU SHOULD EXPECT: Some feelings of bloating in the abdomen. Passage of more gas than usual.  Walking can help get rid of the air that was put into your GI tract during the procedure and reduce the bloating.  Please Note:  You might notice some irritation and congestion in your nose or some drainage.  This is from the oxygen used during your procedure.  There is no need for concern and it should clear up in a day or so.  SYMPTOMS TO REPORT IMMEDIATELY:   Following upper endoscopy (EGD)  Vomiting of blood or coffee ground material  New chest pain or pain under the shoulder blades  Painful or persistently difficult swallowing  New shortness of breath  Fever of 100F or higher  Black, tarry-looking stools  For urgent or emergent issues, a gastroenterologist can be reached at any hour by calling [phone]. Do not use MyChart messaging for urgent concerns.    DIET:  We do recommend a small meal at first, but then you may proceed to your regular diet.  Drink plenty of fluids but you should avoid alcoholic beverages for 24 hours.  ACTIVITY:  You should plan to take it easy for the  rest of today and you should NOT DRIVE or use heavy machinery until tomorrow (because of the sedation medicines used during the test).    FOLLOW UP: Our staff will call the number listed on your records Thursday morning between 7:15 am and 8:15 am to check on you and address any questions or concerns that you may have regarding the information given to you following your procedure. If we do not reach you, we will leave a message.  We will attempt to reach you two times.  During this call, we will ask if you have developed any symptoms of COVID 19. If you develop any symptoms (ie: fever, flu-like symptoms, shortness of breath, cough etc.) before then, please call [phone].  If you test positive for Covid 19 in the 2 weeks post procedure, please call and report this information to [place].    If any biopsies were taken you will be contacted by phone or by letter within the next 1-3 weeks.  Please call us at [phone] if you have not heard about the biopsies in 3 weeks.    SIGNATURES/CONFIDENTIALITY: You and/or your care partner have signed paperwork which will be entered into your electronic medical record.  These signatures attest to the fact that that the information above on your After Visit Summary has been reviewed and is understood.  Full responsibility of the confidentiality of this discharge information lies with you and/or your care-partner.

## 2020-03-18 NOTE — Progress Notes (Signed)
C.W. vital signs. 

## 2020-03-18 NOTE — Op Note (Signed)
[facility] Patient Name: Claudia Rasmussen Procedure Date: 03/18/2020 2:24 PM MRN: [id number] Endoscopist: Jackquline Denmark , MD Age: 87 Referring MD:  Date of Birth: 1932-01-23 Gender: Female Account #: [id number] Procedure:                Upper GI endoscopy Indications:              Epigastric abdominal pain with N/V Medicines:                Monitored Anesthesia Care Procedure:                Pre-Anesthesia Assessment:                           - Prior to the procedure, a History and Physical                            was performed, and patient medications and                            allergies were reviewed. The patient's tolerance of                            previous anesthesia was also reviewed. The risks                            and benefits of the procedure and the sedation                            options and risks were discussed with the patient.                            All questions were answered, and informed consent                            was obtained. Prior Anticoagulants: The patient has                            taken last dose of Plavix 5 days ago. ASA Grade                            Assessment: III - A patient with severe systemic                            disease. After reviewing the risks and benefits,                            the patient was deemed in satisfactory condition to                            undergo the procedure.                           After obtaining informed consent, the endoscope was  passed under direct vision. Throughout the                            procedure, the patient's blood pressure, pulse, and                            oxygen saturations were monitored continuously. The                            Endoscope was introduced through the mouth, and                            advanced to the second part of duodenum. The upper                            GI endoscopy was accomplished  without difficulty.                            The patient tolerated the procedure well. Scope In: Scope Out: Findings:                 A wide open open proximal esophageal web was noted.                            The esophagus was otherwise mildly tortuous but                            normal. Z-line well-defined at 35 cm.                           A 3 cm hiatal hernia was present.                           Diffuse mild inflammation characterized by erythema                            was found in the entire examined stomach. Biopsies                            were taken with a cold forceps for histology.                           The examined duodenum was normal except for minimal                            scalloping of duodenal mucosa ? Importance.                            Multiple biopsies for histology were taken with a                            cold forceps for evaluation of celiac disease. Complications:            No immediate complications. Estimated Blood Loss:  Estimated blood loss: none. Impression:               - 3 cm hiatal hernia.                           - Atrophic gastritis. Biopsied.                           - Minimal scalloping of duodenal mucosa ?                            Importance (biopsied to r/o celiac) Recommendation:           - Patient has a contact number available for                            emergencies. The signs and symptoms of potential                            delayed complications were discussed with the                            patient. Return to normal activities tomorrow.                            Written discharge instructions were provided to the                            patient.                           - Resume previous diet.                           - Continue present medications.                           - Resume Plavix (clopidogrel) at prior dose in 2                            days.                           -  Await pathology results.                           - The findings and recommendations were discussed                            with the patient's daughter. We would try Zofran                            for nausea. Zofran 4 mg ODT Q8hrs prn nausea #20,                            no refills. Jackquline Denmark, MD 03/18/2020 3:01:33 PM This report has been signed electronically.

## 2020-03-18 NOTE — Progress Notes (Signed)
pt tolerated well. VSS. awake and to recovery. Report given to RN. Bite block used without trauma. 

## 2020-03-19 DIAGNOSIS — N179 Acute kidney failure, unspecified: Secondary | ICD-10-CM | POA: Diagnosis not present

## 2020-03-20 ENCOUNTER — Telehealth: Payer: Self-pay | Admitting: *Deleted

## 2020-03-20 DIAGNOSIS — N189 Chronic kidney disease, unspecified: Secondary | ICD-10-CM | POA: Diagnosis not present

## 2020-03-20 DIAGNOSIS — N281 Cyst of kidney, acquired: Secondary | ICD-10-CM | POA: Diagnosis not present

## 2020-03-20 DIAGNOSIS — N179 Acute kidney failure, unspecified: Secondary | ICD-10-CM | POA: Diagnosis not present

## 2020-03-20 DIAGNOSIS — N289 Disorder of kidney and ureter, unspecified: Secondary | ICD-10-CM | POA: Diagnosis not present

## 2020-03-20 NOTE — Telephone Encounter (Signed)
  Follow up Call-  Call back number 03/18/2020  Post procedure Call Back phone  # [phone]  Permission to leave phone message Yes  Some recent data might be hidden     Patient questions:  Do you have a fever, pain , or abdominal swelling? No. Pain Score  0 *  Have you tolerated food without any problems? Yes.    Have you been able to return to your normal activities? Yes.    Do you have any questions about your discharge instructions: Diet   No. Medications  No. Follow up visit  No.  Do you have questions or concerns about your Care? No.  Actions: * If pain score is 4 or above: No action needed, pain <4.  1. Have you developed a fever since your procedure?no  2.   Have you had an respiratory symptoms (SOB or cough) since your procedure? no  3.   Have you tested positive for COVID 19 since your procedure no  4.   Have you had any family members/close contacts diagnosed with the COVID 19 since your procedure? no   If yes to any of these questions please route to Joylene John, RN and Joella Prince, RN

## 2020-04-01 ENCOUNTER — Encounter: Payer: Self-pay | Admitting: Gastroenterology

## 2020-05-13 DIAGNOSIS — N8110 Cystocele, unspecified: Secondary | ICD-10-CM | POA: Diagnosis not present

## 2020-05-13 DIAGNOSIS — N3090 Cystitis, unspecified without hematuria: Secondary | ICD-10-CM | POA: Diagnosis not present

## 2020-05-13 DIAGNOSIS — R339 Retention of urine, unspecified: Secondary | ICD-10-CM | POA: Diagnosis not present

## 2020-05-23 DIAGNOSIS — N179 Acute kidney failure, unspecified: Secondary | ICD-10-CM | POA: Diagnosis not present

## 2020-05-27 DIAGNOSIS — N179 Acute kidney failure, unspecified: Secondary | ICD-10-CM | POA: Diagnosis not present

## 2020-05-27 DIAGNOSIS — N1832 Chronic kidney disease, stage 3b: Secondary | ICD-10-CM | POA: Insufficient documentation

## 2020-05-27 DIAGNOSIS — R030 Elevated blood-pressure reading, without diagnosis of hypertension: Secondary | ICD-10-CM | POA: Insufficient documentation

## 2020-06-11 ENCOUNTER — Other Ambulatory Visit: Payer: Self-pay | Admitting: Physician Assistant

## 2020-06-11 DIAGNOSIS — Z23 Encounter for immunization: Secondary | ICD-10-CM | POA: Diagnosis not present

## 2020-06-16 DIAGNOSIS — H524 Presbyopia: Secondary | ICD-10-CM | POA: Diagnosis not present

## 2020-06-16 DIAGNOSIS — Z961 Presence of intraocular lens: Secondary | ICD-10-CM | POA: Diagnosis not present

## 2020-06-16 DIAGNOSIS — H43393 Other vitreous opacities, bilateral: Secondary | ICD-10-CM | POA: Diagnosis not present

## 2020-06-16 DIAGNOSIS — H0100B Unspecified blepharitis left eye, upper and lower eyelids: Secondary | ICD-10-CM | POA: Diagnosis not present

## 2020-06-16 DIAGNOSIS — H16223 Keratoconjunctivitis sicca, not specified as Sjogren's, bilateral: Secondary | ICD-10-CM | POA: Diagnosis not present

## 2020-06-16 DIAGNOSIS — H5203 Hypermetropia, bilateral: Secondary | ICD-10-CM | POA: Diagnosis not present

## 2020-06-16 DIAGNOSIS — H0100A Unspecified blepharitis right eye, upper and lower eyelids: Secondary | ICD-10-CM | POA: Diagnosis not present

## 2020-06-16 DIAGNOSIS — D23122 Other benign neoplasm of skin of left lower eyelid, including canthus: Secondary | ICD-10-CM | POA: Diagnosis not present

## 2020-06-16 DIAGNOSIS — H52203 Unspecified astigmatism, bilateral: Secondary | ICD-10-CM | POA: Diagnosis not present

## 2020-06-18 ENCOUNTER — Ambulatory Visit (INDEPENDENT_AMBULATORY_CARE_PROVIDER_SITE_OTHER): Payer: Medicare Other | Admitting: Physician Assistant

## 2020-06-18 ENCOUNTER — Encounter: Payer: Self-pay | Admitting: Physician Assistant

## 2020-06-18 ENCOUNTER — Other Ambulatory Visit: Payer: Self-pay

## 2020-06-18 VITALS — BP 120/70 | HR 86 | Temp 96.8°F | Ht 63.0 in | Wt 129.8 lb

## 2020-06-18 DIAGNOSIS — R0600 Dyspnea, unspecified: Secondary | ICD-10-CM

## 2020-06-18 DIAGNOSIS — E782 Mixed hyperlipidemia: Secondary | ICD-10-CM

## 2020-06-18 DIAGNOSIS — E038 Other specified hypothyroidism: Secondary | ICD-10-CM | POA: Diagnosis not present

## 2020-06-18 DIAGNOSIS — R0609 Other forms of dyspnea: Secondary | ICD-10-CM | POA: Insufficient documentation

## 2020-06-18 DIAGNOSIS — N1832 Chronic kidney disease, stage 3b: Secondary | ICD-10-CM

## 2020-06-18 DIAGNOSIS — R5383 Other fatigue: Secondary | ICD-10-CM

## 2020-06-18 NOTE — Progress Notes (Signed)
Established Patient Office Visit  Subjective:  Patient ID: Claudia Rasmussen, female    DOB: 1931-09-16  Age: 87 y.o. MRN: [id number]  CC:  Chief Complaint  Patient presents with  . Hyperlipidemia    70M follow up    HPI [name] presents for follow up hypothyroidism  [name] presents with atrophy of thyroid (acquired).  Date of diagnosis 2002.  She is currently taking Synthroid, 75 mcg daily.  TSH was last checked 6 months ago.  The result was reported as normal .  Patient has had some more fatigue than usual recently    Pt presents with hyperlipidemia.  Date of diagnosis 09/2009.  Current treatment includes simvastatin 10mg  qd  Compliance with treatment has been good; she takes her medication as directed, maintains her low cholesterol diet, follows up as directed, and maintains her exercise regimen.  She denies experiencing any hypercholesterolemia related symptoms  Dx with age-related osteoporosis without current pathological fracture; this has been a problem for the past several years.  She describes the intensity of arthralgias as moderate.  Tests done to help confirm the diagnosis and evaluate for treatable causes include bone densinometry test ( decreased bone mass ). She is currently on Prolia injections    Pt with history of Chronic Kidney disease - is following with nephrology - states everything has been stable recently except that she has had some elevated bp readings in the past - at home has been 196Q systolically  Pt states that over the past 5 months she has noted when she is walking a mild incline at her home she has trouble breathing - she denies chest pain or pressure -- has also noted with minimal activities around her home     .  Past Medical History:  Diagnosis Date  . Age-related osteoporosis without current pathological fracture   . Arthritis   . Atrophy of thyroid (acquired)   . Blepharitis 2021  . Cholelithiases   . Clostridium difficile infection   .  Diverticulitis   . Diverticulitis of large intestine without perforation or abscess without bleeding   . Family history of colon cancer   . Migraine without aura, not intractable, without status migrainosus   . Mixed hyperlipidemia   . Stroke ([place])   . Thyroid disease   . UTI (urinary tract infection)   . Vaginal prolapse   . Vitamin D deficiency, unspecified     Past Surgical History:  Procedure Laterality Date  . ABDOMINAL HYSTERECTOMY    . APPENDECTOMY  1969  . CATARACT EXTRACTION, BILATERAL Bilateral    right- 03/22/2018 and left 05/10/2018  . COLONOSCOPY  05/12/2009   Colonic polyp status post polypectomy. Moderate predominanltly left colonic diverticulosis. Internal hemorrhoids  . OOPHORECTOMY Left     Family History  Problem Relation Age of Onset  . Breast cancer Mother   . Colon cancer Father   . Prostate cancer Brother   . Coronary artery disease Other   . Hypertension Other   . Esophageal cancer Neg Hx   . Rectal cancer Neg Hx   . Stomach cancer Neg Hx     Social History   Socioeconomic History  . Marital status: Widowed    Spouse name: Not on file  . Number of children: 3  . Years of education: Not on file  . Highest education level: Not on file  Occupational History  . Not on file  Tobacco Use  . Smoking status: Former Research scientist (life sciences)  . Smokeless tobacco: Never Used  Vaping Use  . Vaping Use: Never used  Substance and Sexual Activity  . Alcohol use: Yes    Comment: Drinks alcohol very infrequently. She typically consumes white wine.  . Drug use: Never  . Sexual activity: Not on file  Other Topics Concern  . Not on file  Social History Narrative  . Not on file   Social Determinants of Health   Financial Resource Strain: Not on file  Food Insecurity: Not on file  Transportation Needs: Not on file  Physical Activity: Not on file  Stress: Not on file  Social Connections: Not on file  Intimate Partner Violence: Not on file     Current Outpatient  Medications:  .  aspirin EC 81 MG tablet, Take 81 mg by mouth daily., Disp: , Rfl:  .  bethanechol (URECHOLINE) 25 MG tablet, Take 25 mg by mouth daily., Disp: , Rfl:  .  cetirizine (ZYRTEC) 10 MG tablet, TAKE 1 TABLET DAILY, Disp: 90 tablet, Rfl: 3 .  Cholecalciferol (VITAMIN D3) 25 MCG (1000 UT) CAPS, Take 1,000 Units by mouth daily., Disp: , Rfl:  .  clopidogrel (PLAVIX) 75 MG tablet, TAKE 1 TABLET DAILY, Disp: 90 tablet, Rfl: 3 .  denosumab (PROLIA) 60 MG/ML SOSY injection, Inject 60 mg into the skin every 6 (six) months., Disp: , Rfl:  .  levothyroxine (SYNTHROID) 75 MCG tablet, TAKE 1 TABLET DAILY, Disp: 90 tablet, Rfl: 0 .  loratadine (CLARITIN) 10 MG tablet, Take 10 mg by mouth daily., Disp: , Rfl:  .  Probiotic Product (PROBIOTIC-10 PO), Take by mouth., Disp: , Rfl:  .  simvastatin (ZOCOR) 10 MG tablet, TAKE 1 TABLET DAILY, Disp: 90 tablet, Rfl: 3   Allergies  Allergen Reactions  . Shellfish Allergy Other (See Comments)    Itching, Hives Itching, Hives  . Avelox [Moxifloxacin Hcl In Nacl]   . Cephalosporins Other (See Comments)    Itching   . Levaquin [Levofloxacin]   . Nitrofuran Derivatives Other (See Comments)    Made her not feel good, caused tremors, increased creatnine  . Azithromycin Rash  . Moxifloxacin Hcl Rash  . Phenobarbital Rash    ROS CONSTITUTIONAL: see HPI E/N/T: Negative for ear pain, nasal congestion and sore throat.  CARDIOVASCULAR: Negative for chest pain, dizziness, palpitations and pedal edema.  RESPIRATORY: see HPI GASTROINTESTINAL: Negative for abdominal pain, acid reflux symptoms, constipation, diarrhea, nausea and vomiting.  MSK: see HPI INTEGUMENTARY: Negative for rash.  NEUROLOGICAL: Negative for dizziness and headaches.  PSYCHIATRIC: Negative for sleep disturbance and to question depression screen.  Negative for depression, negative for anhedonia.         Objective:    PHYSICAL EXAM:   VS: BP 120/70 (BP Location: Left Arm, Patient  Position: Sitting, Cuff Size: Normal)   Pulse 86   Temp (!) 96.8 F (36 C) (Temporal)   Ht 5\' 3"  (1.6 m)   Wt 129 lb 12.8 oz (58.9 kg)   SpO2 93%   BMI 22.99 kg/m  PHYSICAL EXAM:    GEN: Well nourished, well developed, in no acute distress  Cardiac: RRR; no murmurs, rubs, or gallops,no edema - Respiratory:  normal respiratory rate and pattern with no distress - normal breath sounds with no rales, rhonchi, wheezes or rubs MS: no deformity or atrophy  Skin: warm and dry, no rash  Psych: euthymic mood, appropriate affect and demeanor  EKG - no acute changes noted  BP 120/70 (BP Location: Left Arm, Patient Position: Sitting, Cuff Size: Normal)  Pulse 86   Temp (!) 96.8 F (36 C) (Temporal)   Ht 5\' 3"  (1.6 m)   Wt 129 lb 12.8 oz (58.9 kg)   SpO2 93%   BMI 22.99 kg/m  Wt Readings from Last 3 Encounters:  06/18/20 129 lb 12.8 oz (58.9 kg)  03/18/20 125 lb (56.7 kg)  03/12/20 125 lb (56.7 kg)     Health Maintenance Due  Topic Date Due  . TETANUS/TDAP  Never done  . MAMMOGRAM  04/16/2020  . COVID-19 Vaccine (4 - Booster for Moderna series) 06/17/2020    There are no preventive care reminders to display for this patient.  Lab Results  Component Value Date   TSH 2.570 06/19/2019   Lab Results  Component Value Date   WBC 6.0 03/04/2020   HGB 11.9 03/04/2020   HCT 36.4 03/04/2020   MCV 92 03/04/2020   PLT 199 03/04/2020   Lab Results  Component Value Date   NA 144 03/04/2020   K 5.3 (H) 03/04/2020   CO2 19 (L) 03/04/2020   GLUCOSE 86 03/04/2020   BUN 39 (H) 03/04/2020   CREATININE 1.70 (H) 03/04/2020   BILITOT 1.0 03/04/2020   ALKPHOS 84 03/04/2020   AST 20 03/04/2020   ALT 17 03/04/2020   PROT 6.9 03/04/2020   ALBUMIN 4.2 03/04/2020   CALCIUM 9.3 03/04/2020   ANIONGAP 13 09/29/2018   Lab Results  Component Value Date   CHOL 178 06/19/2019   Lab Results  Component Value Date   HDL 78 06/19/2019   Lab Results  Component Value Date   LDLCALC 86  06/19/2019   Lab Results  Component Value Date   TRIG 75 06/19/2019   Lab Results  Component Value Date   CHOLHDL 2.3 06/19/2019   No results found for: HGBA1C    Assessment & Plan:   Problem List Items Addressed This Visit      Endocrine   Other specified hypothyroidism - Primary   Relevant Orders   TSH Continue current meds     Genitourinary   Stage 3b chronic kidney disease ([place])     Other   Mixed hyperlipidemia   Relevant Orders   Lipid panel Continue meds   Other fatigue   Relevant Orders   CBC with Differential/Platelet   Comprehensive metabolic panel   Exertional dyspnea   Relevant Orders   EKG 12-Lead Referral to cardiology      No orders of the defined types were placed in this encounter.   Follow-up: Return in about 3 months (around 09/17/2020) for chronic follow up.    SARA R Luismario Coston, [name]

## 2020-06-19 ENCOUNTER — Other Ambulatory Visit: Payer: Self-pay | Admitting: Physician Assistant

## 2020-06-19 DIAGNOSIS — E038 Other specified hypothyroidism: Secondary | ICD-10-CM

## 2020-06-19 DIAGNOSIS — E875 Hyperkalemia: Secondary | ICD-10-CM

## 2020-06-19 LAB — CBC WITH DIFFERENTIAL/PLATELET
Basophils Absolute: 0.1 10*3/uL (ref 0.0–0.2)
Basos: 2 %
EOS (ABSOLUTE): 0.2 10*3/uL (ref 0.0–0.4)
Eos: 4 %
Hematocrit: 37.3 % (ref 34.0–46.6)
Hemoglobin: 12.1 g/dL (ref 11.1–15.9)
Immature Grans (Abs): 0.1 10*3/uL (ref 0.0–0.1)
Immature Granulocytes: 1 %
Lymphocytes Absolute: 1.5 10*3/uL (ref 0.7–3.1)
Lymphs: 26 %
MCH: 30.4 pg (ref 26.6–33.0)
MCHC: 32.4 g/dL (ref 31.5–35.7)
MCV: 94 fL (ref 79–97)
Monocytes Absolute: 0.5 10*3/uL (ref 0.1–0.9)
Monocytes: 8 %
Neutrophils Absolute: 3.4 10*3/uL (ref 1.4–7.0)
Neutrophils: 59 %
Platelets: 211 10*3/uL (ref 150–450)
RBC: 3.98 x10E6/uL (ref 3.77–5.28)
RDW: 12.6 % (ref 11.7–15.4)
WBC: 5.8 10*3/uL (ref 3.4–10.8)

## 2020-06-19 LAB — LIPID PANEL
Chol/HDL Ratio: 2.4 ratio (ref 0.0–4.4)
Cholesterol, Total: 171 mg/dL (ref 100–199)
HDL: 72 mg/dL (ref >39)
LDL Chol Calc (NIH): 87 mg/dL (ref 0–99)
Triglycerides: 60 mg/dL (ref 0–149)
VLDL Cholesterol Cal: 12 mg/dL (ref 5–40)

## 2020-06-19 LAB — COMPREHENSIVE METABOLIC PANEL
ALT: 18 IU/L (ref 0–32)
AST: 22 IU/L (ref 0–40)
Albumin/Globulin Ratio: 1.5 (ref 1.2–2.2)
Albumin: 4.2 g/dL (ref 3.6–4.6)
Alkaline Phosphatase: 84 IU/L (ref 44–121)
BUN/Creatinine Ratio: 26 (ref 12–28)
BUN: 44 mg/dL — ABNORMAL HIGH (ref 8–27)
Bilirubin Total: 0.7 mg/dL (ref 0.0–1.2)
CO2: 20 mmol/L (ref 20–29)
Calcium: 9.7 mg/dL (ref 8.7–10.3)
Chloride: 109 mmol/L — ABNORMAL HIGH (ref 96–106)
Creatinine, Ser: 1.72 mg/dL — ABNORMAL HIGH (ref 0.57–1.00)
Globulin, Total: 2.8 g/dL (ref 1.5–4.5)
Glucose: 93 mg/dL (ref 65–99)
Potassium: 5.4 mmol/L — ABNORMAL HIGH (ref 3.5–5.2)
Sodium: 143 mmol/L (ref 134–144)
Total Protein: 7 g/dL (ref 6.0–8.5)
eGFR: 29 mL/min/{1.73_m2} — ABNORMAL LOW (ref >59)

## 2020-06-19 LAB — CARDIOVASCULAR RISK ASSESSMENT

## 2020-06-19 LAB — TSH: TSH: 4.97 u[IU]/mL — ABNORMAL HIGH (ref 0.450–4.500)

## 2020-06-24 DIAGNOSIS — D23122 Other benign neoplasm of skin of left lower eyelid, including canthus: Secondary | ICD-10-CM | POA: Diagnosis not present

## 2020-07-10 DIAGNOSIS — I10 Essential (primary) hypertension: Secondary | ICD-10-CM | POA: Diagnosis not present

## 2020-07-10 DIAGNOSIS — Z8673 Personal history of transient ischemic attack (TIA), and cerebral infarction without residual deficits: Secondary | ICD-10-CM | POA: Diagnosis not present

## 2020-07-10 DIAGNOSIS — R0609 Other forms of dyspnea: Secondary | ICD-10-CM | POA: Diagnosis not present

## 2020-07-11 DIAGNOSIS — I083 Combined rheumatic disorders of mitral, aortic and tricuspid valves: Secondary | ICD-10-CM | POA: Diagnosis not present

## 2020-07-17 ENCOUNTER — Other Ambulatory Visit: Payer: Medicare Other

## 2020-07-17 ENCOUNTER — Other Ambulatory Visit: Payer: Self-pay

## 2020-07-17 DIAGNOSIS — E038 Other specified hypothyroidism: Secondary | ICD-10-CM

## 2020-07-17 DIAGNOSIS — E875 Hyperkalemia: Secondary | ICD-10-CM

## 2020-07-18 ENCOUNTER — Other Ambulatory Visit: Payer: Self-pay | Admitting: Physician Assistant

## 2020-07-18 DIAGNOSIS — R899 Unspecified abnormal finding in specimens from other organs, systems and tissues: Secondary | ICD-10-CM

## 2020-07-18 LAB — COMPREHENSIVE METABOLIC PANEL
ALT: 17 IU/L (ref 0–32)
AST: 24 IU/L (ref 0–40)
Albumin/Globulin Ratio: 1.6 (ref 1.2–2.2)
Albumin: 4.5 g/dL (ref 3.6–4.6)
Alkaline Phosphatase: 84 IU/L (ref 44–121)
BUN/Creatinine Ratio: 23 (ref 12–28)
BUN: 43 mg/dL — ABNORMAL HIGH (ref 8–27)
Bilirubin Total: 0.6 mg/dL (ref 0.0–1.2)
CO2: 13 mmol/L — ABNORMAL LOW (ref 20–29)
Calcium: 10 mg/dL (ref 8.7–10.3)
Chloride: 107 mmol/L — ABNORMAL HIGH (ref 96–106)
Creatinine, Ser: 1.87 mg/dL — ABNORMAL HIGH (ref 0.57–1.00)
Globulin, Total: 2.9 g/dL (ref 1.5–4.5)
Glucose: 83 mg/dL (ref 65–99)
Potassium: 5.9 mmol/L — ABNORMAL HIGH (ref 3.5–5.2)
Sodium: 141 mmol/L (ref 134–144)
Total Protein: 7.4 g/dL (ref 6.0–8.5)
eGFR: 26 mL/min/{1.73_m2} — ABNORMAL LOW (ref >59)

## 2020-07-18 LAB — TSH: TSH: 2.72 u[IU]/mL (ref 0.450–4.500)

## 2020-07-24 ENCOUNTER — Other Ambulatory Visit: Payer: Self-pay

## 2020-07-24 ENCOUNTER — Ambulatory Visit: Payer: Medicare Other

## 2020-07-24 DIAGNOSIS — R899 Unspecified abnormal finding in specimens from other organs, systems and tissues: Secondary | ICD-10-CM | POA: Diagnosis not present

## 2020-07-24 LAB — BASIC METABOLIC PANEL
BUN/Creatinine Ratio: 18 (ref 12–28)
BUN: 32 mg/dL — ABNORMAL HIGH (ref 8–27)
CO2: 19 mmol/L — ABNORMAL LOW (ref 20–29)
Calcium: 9.8 mg/dL (ref 8.7–10.3)
Chloride: 107 mmol/L — ABNORMAL HIGH (ref 96–106)
Creatinine, Ser: 1.78 mg/dL — ABNORMAL HIGH (ref 0.57–1.00)
Glucose: 94 mg/dL (ref 65–99)
Potassium: 5.1 mmol/L (ref 3.5–5.2)
Sodium: 141 mmol/L (ref 134–144)
eGFR: 27 mL/min/{1.73_m2} — ABNORMAL LOW (ref >59)

## 2020-08-14 ENCOUNTER — Other Ambulatory Visit: Payer: Self-pay

## 2020-08-14 ENCOUNTER — Ambulatory Visit (INDEPENDENT_AMBULATORY_CARE_PROVIDER_SITE_OTHER): Payer: Medicare Other

## 2020-08-14 DIAGNOSIS — M84374A Stress fracture, right foot, initial encounter for fracture: Secondary | ICD-10-CM | POA: Diagnosis not present

## 2020-08-14 DIAGNOSIS — M818 Other osteoporosis without current pathological fracture: Secondary | ICD-10-CM

## 2020-08-14 DIAGNOSIS — S8264XA Nondisplaced fracture of lateral malleolus of right fibula, initial encounter for closed fracture: Secondary | ICD-10-CM | POA: Diagnosis not present

## 2020-08-14 MED ORDER — DENOSUMAB 60 MG/ML ~~LOC~~ SOSY
60.0000 mg | PREFILLED_SYRINGE | Freq: Once | SUBCUTANEOUS | Status: AC
  Administered 2020-08-14: 60 mg via SUBCUTANEOUS

## 2020-08-14 NOTE — Progress Notes (Signed)
Claudia Rasmussen comes for her 6 month prolia injection.  She tolerated last injection well with no side effects reported.

## 2020-08-21 DIAGNOSIS — S8264XD Nondisplaced fracture of lateral malleolus of right fibula, subsequent encounter for closed fracture with routine healing: Secondary | ICD-10-CM | POA: Diagnosis not present

## 2020-08-21 DIAGNOSIS — M84374D Stress fracture, right foot, subsequent encounter for fracture with routine healing: Secondary | ICD-10-CM | POA: Diagnosis not present

## 2020-08-27 ENCOUNTER — Emergency Department (HOSPITAL_COMMUNITY): Payer: Medicare Other

## 2020-08-27 ENCOUNTER — Inpatient Hospital Stay (HOSPITAL_COMMUNITY)
Admission: EM | Admit: 2020-08-27 | Discharge: 2020-08-29 | DRG: 042 | Disposition: A | Discharge status: Home / Self Care | Payer: Medicare Other | Attending: Student in an Organized Health Care Education/Training Program | Admitting: Student in an Organized Health Care Education/Training Program

## 2020-08-27 ENCOUNTER — Inpatient Hospital Stay (HOSPITAL_COMMUNITY): Payer: Medicare Other

## 2020-08-27 ENCOUNTER — Encounter (HOSPITAL_COMMUNITY): Payer: Self-pay

## 2020-08-27 ENCOUNTER — Other Ambulatory Visit: Payer: Self-pay

## 2020-08-27 DIAGNOSIS — Z20822 Contact with and (suspected) exposure to covid-19: Secondary | ICD-10-CM | POA: Diagnosis present

## 2020-08-27 DIAGNOSIS — N1832 Chronic kidney disease, stage 3b: Secondary | ICD-10-CM | POA: Diagnosis present

## 2020-08-27 DIAGNOSIS — R42 Dizziness and giddiness: Secondary | ICD-10-CM | POA: Diagnosis not present

## 2020-08-27 DIAGNOSIS — I639 Cerebral infarction, unspecified: Secondary | ICD-10-CM | POA: Diagnosis present

## 2020-08-27 DIAGNOSIS — E782 Mixed hyperlipidemia: Secondary | ICD-10-CM | POA: Diagnosis present

## 2020-08-27 DIAGNOSIS — Z87891 Personal history of nicotine dependence: Secondary | ICD-10-CM | POA: Diagnosis not present

## 2020-08-27 DIAGNOSIS — R2990 Unspecified symptoms and signs involving the nervous system: Secondary | ICD-10-CM

## 2020-08-27 DIAGNOSIS — I6340 Cerebral infarction due to embolism of unspecified cerebral artery: Principal | ICD-10-CM | POA: Diagnosis present

## 2020-08-27 DIAGNOSIS — Z800 Family history of malignant neoplasm of digestive organs: Secondary | ICD-10-CM

## 2020-08-27 DIAGNOSIS — E039 Hypothyroidism, unspecified: Secondary | ICD-10-CM | POA: Diagnosis present

## 2020-08-27 DIAGNOSIS — R29704 NIHSS score 4: Secondary | ICD-10-CM | POA: Diagnosis present

## 2020-08-27 DIAGNOSIS — Z8673 Personal history of transient ischemic attack (TIA), and cerebral infarction without residual deficits: Secondary | ICD-10-CM | POA: Diagnosis not present

## 2020-08-27 DIAGNOSIS — Z79890 Hormone replacement therapy (postmenopausal): Secondary | ICD-10-CM

## 2020-08-27 DIAGNOSIS — W19XXXA Unspecified fall, initial encounter: Secondary | ICD-10-CM | POA: Diagnosis present

## 2020-08-27 DIAGNOSIS — S92301D Fracture of unspecified metatarsal bone(s), right foot, subsequent encounter for fracture with routine healing: Secondary | ICD-10-CM

## 2020-08-27 DIAGNOSIS — W19XXXD Unspecified fall, subsequent encounter: Secondary | ICD-10-CM | POA: Diagnosis present

## 2020-08-27 DIAGNOSIS — Z8042 Family history of malignant neoplasm of prostate: Secondary | ICD-10-CM | POA: Diagnosis not present

## 2020-08-27 DIAGNOSIS — R260 Ataxic gait: Secondary | ICD-10-CM | POA: Diagnosis present

## 2020-08-27 DIAGNOSIS — R4781 Slurred speech: Secondary | ICD-10-CM | POA: Diagnosis present

## 2020-08-27 DIAGNOSIS — I10 Essential (primary) hypertension: Secondary | ICD-10-CM | POA: Diagnosis not present

## 2020-08-27 DIAGNOSIS — Z91013 Allergy to seafood: Secondary | ICD-10-CM | POA: Diagnosis not present

## 2020-08-27 DIAGNOSIS — Z881 Allergy status to other antibiotic agents status: Secondary | ICD-10-CM

## 2020-08-27 DIAGNOSIS — I6300 Cerebral infarction due to thrombosis of unspecified precerebral artery: Secondary | ICD-10-CM | POA: Diagnosis not present

## 2020-08-27 DIAGNOSIS — Z9841 Cataract extraction status, right eye: Secondary | ICD-10-CM

## 2020-08-27 DIAGNOSIS — R471 Dysarthria and anarthria: Secondary | ICD-10-CM | POA: Diagnosis present

## 2020-08-27 DIAGNOSIS — I129 Hypertensive chronic kidney disease with stage 1 through stage 4 chronic kidney disease, or unspecified chronic kidney disease: Secondary | ICD-10-CM | POA: Diagnosis present

## 2020-08-27 DIAGNOSIS — R29810 Facial weakness: Secondary | ICD-10-CM | POA: Diagnosis present

## 2020-08-27 DIAGNOSIS — Z9842 Cataract extraction status, left eye: Secondary | ICD-10-CM | POA: Diagnosis not present

## 2020-08-27 DIAGNOSIS — M7989 Other specified soft tissue disorders: Secondary | ICD-10-CM | POA: Diagnosis not present

## 2020-08-27 DIAGNOSIS — Z90710 Acquired absence of both cervix and uterus: Secondary | ICD-10-CM | POA: Diagnosis not present

## 2020-08-27 DIAGNOSIS — I63442 Cerebral infarction due to embolism of left cerebellar artery: Secondary | ICD-10-CM | POA: Diagnosis present

## 2020-08-27 DIAGNOSIS — Z8249 Family history of ischemic heart disease and other diseases of the circulatory system: Secondary | ICD-10-CM | POA: Diagnosis not present

## 2020-08-27 DIAGNOSIS — R001 Bradycardia, unspecified: Secondary | ICD-10-CM | POA: Diagnosis not present

## 2020-08-27 DIAGNOSIS — I6389 Other cerebral infarction: Secondary | ICD-10-CM | POA: Diagnosis not present

## 2020-08-27 DIAGNOSIS — Z86010 Personal history of colonic polyps: Secondary | ICD-10-CM | POA: Diagnosis not present

## 2020-08-27 DIAGNOSIS — Z79899 Other long term (current) drug therapy: Secondary | ICD-10-CM

## 2020-08-27 DIAGNOSIS — Z833 Family history of diabetes mellitus: Secondary | ICD-10-CM

## 2020-08-27 DIAGNOSIS — Z888 Allergy status to other drugs, medicaments and biological substances status: Secondary | ICD-10-CM

## 2020-08-27 DIAGNOSIS — Z823 Family history of stroke: Secondary | ICD-10-CM

## 2020-08-27 DIAGNOSIS — R4701 Aphasia: Secondary | ICD-10-CM | POA: Diagnosis not present

## 2020-08-27 DIAGNOSIS — Z803 Family history of malignant neoplasm of breast: Secondary | ICD-10-CM | POA: Diagnosis not present

## 2020-08-27 DIAGNOSIS — R29818 Other symptoms and signs involving the nervous system: Secondary | ICD-10-CM | POA: Diagnosis not present

## 2020-08-27 LAB — DIFFERENTIAL
Abs Immature Granulocytes: 0.06 10*3/uL (ref 0.00–0.07)
Basophils Absolute: 0.1 10*3/uL (ref 0.0–0.1)
Basophils Relative: 1 %
Eosinophils Absolute: 0.1 10*3/uL (ref 0.0–0.5)
Eosinophils Relative: 1 %
Immature Granulocytes: 1 %
Lymphocytes Relative: 19 %
Lymphs Abs: 1.2 10*3/uL (ref 0.7–4.0)
Monocytes Absolute: 0.4 10*3/uL (ref 0.1–1.0)
Monocytes Relative: 6 %
Neutro Abs: 4.5 10*3/uL (ref 1.7–7.7)
Neutrophils Relative %: 72 %

## 2020-08-27 LAB — COMPREHENSIVE METABOLIC PANEL
ALT: 16 U/L (ref 0–44)
AST: 22 U/L (ref 15–41)
Albumin: 3.8 g/dL (ref 3.5–5.0)
Alkaline Phosphatase: 76 U/L (ref 38–126)
Anion gap: 10 (ref 5–15)
BUN: 39 mg/dL — ABNORMAL HIGH (ref 8–23)
CO2: 19 mmol/L — ABNORMAL LOW (ref 22–32)
Calcium: 9.7 mg/dL (ref 8.9–10.3)
Chloride: 108 mmol/L (ref 98–111)
Creatinine, Ser: 1.92 mg/dL — ABNORMAL HIGH (ref 0.44–1.00)
GFR, Estimated: 25 mL/min — ABNORMAL LOW (ref >60)
Glucose, Bld: 121 mg/dL — ABNORMAL HIGH (ref 70–99)
Potassium: 4.1 mmol/L (ref 3.5–5.1)
Sodium: 137 mmol/L (ref 135–145)
Total Bilirubin: 1.3 mg/dL — ABNORMAL HIGH (ref 0.3–1.2)
Total Protein: 6.8 g/dL (ref 6.5–8.1)

## 2020-08-27 LAB — I-STAT CHEM 8, ED
BUN: 43 mg/dL — ABNORMAL HIGH (ref 8–23)
Calcium, Ion: 1.22 mmol/L (ref 1.15–1.40)
Chloride: 111 mmol/L (ref 98–111)
Creatinine, Ser: 1.9 mg/dL — ABNORMAL HIGH (ref 0.44–1.00)
Glucose, Bld: 115 mg/dL — ABNORMAL HIGH (ref 70–99)
HCT: 36 % (ref 36.0–46.0)
Hemoglobin: 12.2 g/dL (ref 12.0–15.0)
Potassium: 4.3 mmol/L (ref 3.5–5.1)
Sodium: 139 mmol/L (ref 135–145)
TCO2: 19 mmol/L — ABNORMAL LOW (ref 22–32)

## 2020-08-27 LAB — CBC
HCT: 36.7 % (ref 36.0–46.0)
Hemoglobin: 12.1 g/dL (ref 12.0–15.0)
MCH: 31.1 pg (ref 26.0–34.0)
MCHC: 33 g/dL (ref 30.0–36.0)
MCV: 94.3 fL (ref 80.0–100.0)
Platelets: 189 10*3/uL (ref 150–400)
RBC: 3.89 MIL/uL (ref 3.87–5.11)
RDW: 13.5 % (ref 11.5–15.5)
WBC: 6.3 10*3/uL (ref 4.0–10.5)
nRBC: 0 % (ref 0.0–0.2)

## 2020-08-27 LAB — CBG MONITORING, ED: Glucose-Capillary: 106 mg/dL — ABNORMAL HIGH (ref 70–99)

## 2020-08-27 LAB — RESP PANEL BY RT-PCR (FLU A&B, COVID) ARPGX2
Influenza A by PCR: NEGATIVE
Influenza B by PCR: NEGATIVE
SARS Coronavirus 2 by RT PCR: NEGATIVE

## 2020-08-27 LAB — ETHANOL: Alcohol, Ethyl (B): <10 mg/dL (ref <10)

## 2020-08-27 LAB — PROTIME-INR
INR: 1 (ref 0.8–1.2)
Prothrombin Time: 13.1 seconds (ref 11.4–15.2)

## 2020-08-27 LAB — APTT: aPTT: 30 seconds (ref 24–36)

## 2020-08-27 MED ORDER — SENNOSIDES-DOCUSATE SODIUM 8.6-50 MG PO TABS: 1.0000 | ORAL_TABLET | Freq: Every evening | ORAL | Status: DC | PRN

## 2020-08-27 MED ORDER — SODIUM CHLORIDE 0.9% FLUSH
3.0000 mL | Freq: Once | INTRAVENOUS | Status: AC
Start: 2020-08-27 — End: 2020-08-27
  Administered 2020-08-27: 3 mL via INTRAVENOUS

## 2020-08-27 MED ORDER — CLOPIDOGREL BISULFATE 75 MG PO TABS
75.0000 mg | ORAL_TABLET | Freq: Every day | ORAL | Status: DC
  Administered 2020-08-28 – 2020-08-29 (×2): 75 mg via ORAL
  Filled 2020-08-27 (×2): qty 1

## 2020-08-27 MED ORDER — ASPIRIN EC 81 MG PO TBEC
81.0000 mg | DELAYED_RELEASE_TABLET | Freq: Every day | ORAL | Status: DC
  Administered 2020-08-27 – 2020-08-29 (×3): 81 mg via ORAL
  Filled 2020-08-27 (×3): qty 1

## 2020-08-27 MED ORDER — STROKE: EARLY STAGES OF RECOVERY BOOK
Freq: Once | Status: DC
  Filled 2020-08-27: qty 1

## 2020-08-27 MED ORDER — ACETAMINOPHEN 160 MG/5ML PO SOLN: 650.0000 mg | ORAL | Status: DC | PRN

## 2020-08-27 MED ORDER — ACETAMINOPHEN 650 MG RE SUPP: 650.0000 mg | RECTAL | Status: DC | PRN

## 2020-08-27 MED ORDER — ACETAMINOPHEN 325 MG PO TABS: 650.0000 mg | ORAL_TABLET | ORAL | Status: DC | PRN

## 2020-08-27 MED ORDER — CLOPIDOGREL BISULFATE 300 MG PO TABS
300.0000 mg | ORAL_TABLET | Freq: Once | ORAL | Status: AC
  Administered 2020-08-27: 300 mg via ORAL
  Filled 2020-08-27: qty 1

## 2020-08-27 NOTE — ED Provider Notes (Signed)
Emergency Department Provider Note   I have reviewed the triage vital signs and the nursing notes.   HISTORY  Chief Complaint Code Stroke   HPI Claudia Rasmussen is a 87 y.o. female with past medical history reviewed below presents to the emergency department by EMS as a code stroke.  Patient awoke this morning feeling slightly off.  She had some difficulty with walking like she was drifting to the left.  She has recently fractured her foot but states that this felt suddenly different than before.  She went about her day but noticed that her speech was slurred.  Her daughter spoke with her and also noted the slurred speech.  She felt that her left face was also drooped and called 911.  Patient denies any headache, chest pain, numbness.  She does have prior history of TIA.  No radiation of symptoms or other modifying factors.  Past Medical History:  Diagnosis Date   Age-related osteoporosis without current pathological fracture    Arthritis    Atrophy of thyroid (acquired)    Blepharitis 2021   Cholelithiases    Clostridium difficile infection    Diverticulitis    Diverticulitis of large intestine without perforation or abscess without bleeding    Family history of colon cancer    Migraine without aura, not intractable, without status migrainosus    Mixed hyperlipidemia    Stroke [facility])    Thyroid disease    UTI (urinary tract infection)    Vaginal prolapse    Vitamin D deficiency, unspecified     Patient Active Problem List   Diagnosis Date Noted   Other fatigue 06/18/2020   Exertional dyspnea 06/18/2020   Elevated BP without diagnosis of hypertension 05/27/2020   Stage 3b chronic kidney disease ([place]) 05/27/2020   AKI (acute kidney injury) ([place]) 03/19/2020   TIA (transient ischemic attack) 03/05/2020   Acute cystitis with hematuria 02/12/2020   Abnormal renal function test 02/12/2020   Epigastric pain 02/12/2020   Mixed hyperlipidemia 06/19/2019   Other specified  hypothyroidism 06/19/2019   Left hip pain 06/19/2019   Other osteoporosis without current pathological fracture 06/19/2019   Keratoconjunctivitis sicca of both eyes not specified as Sjogren's 07/26/2017   Presbyopia of both eyes 07/26/2017   Vitreous floater, bilateral 07/26/2017    Past Surgical History:  Procedure Laterality Date   ABDOMINAL HYSTERECTOMY     APPENDECTOMY  1969   CATARACT EXTRACTION, BILATERAL Bilateral    right- 03/22/2018 and left 05/10/2018   COLONOSCOPY  05/12/2009   Colonic polyp status post polypectomy. Moderate predominanltly left colonic diverticulosis. Internal hemorrhoids   OOPHORECTOMY Left     Allergies Shellfish allergy, Avelox [moxifloxacin hcl in nacl], Cephalosporins, Levaquin [levofloxacin], Nitrofuran derivatives, Azithromycin, Moxifloxacin hcl, and Phenobarbital  Family History  Problem Relation Age of Onset   Breast cancer Mother    Colon cancer Father    Prostate cancer Brother    Coronary artery disease Other    Hypertension Other    Esophageal cancer Neg Hx    Rectal cancer Neg Hx    Stomach cancer Neg Hx     Social History Social History   Tobacco Use   Smoking status: Former    Pack years: 0.00   Smokeless tobacco: Never  Vaping Use   Vaping Use: Never used  Substance Use Topics   Alcohol use: Yes    Comment: Drinks alcohol very infrequently. She typically consumes white wine.   Drug use: Never    Review of Systems  Constitutional: No fever/chills Eyes: No visual changes. ENT: No sore throat. Cardiovascular: Denies chest pain. Respiratory: Denies shortness of breath. Gastrointestinal: No abdominal pain.  No nausea, no vomiting.  No diarrhea.  No constipation. Genitourinary: Negative for dysuria. Musculoskeletal: Negative for back pain. Skin: Negative for rash. Neurological: Negative for headaches. Positive left face weakness, gait abnormality, and slurred speech.   10-point ROS otherwise  negative.  ____________________________________________   PHYSICAL EXAM:  VITAL SIGNS: ED Triage Vitals  Enc Vitals Group     BP 08/27/20 1422 (!) 146/82     Pulse Rate 08/27/20 1422 85     Resp 08/27/20 1422 18     Temp --      Temp src --      SpO2 08/27/20 1420 100 %     Weight 08/27/20 1400 130 lb 15.3 oz (59.4 kg)     Height 08/27/20 1423 5\' 4"  (1.626 m)   Constitutional: Alert and oriented. Well appearing and in no acute distress. Eyes: Conjunctivae are normal.  Head: Atraumatic. Nose: No congestion/rhinnorhea. Mouth/Throat: Mucous membranes are moist.  Neck: No stridor. Cardiovascular: Normal rate, regular rhythm. Good peripheral circulation. Grossly normal heart sounds.   Respiratory: Normal respiratory effort.  No retractions. Lungs CTAB. Gastrointestinal: Soft and nontender. No distention.  Musculoskeletal: No lower extremity tenderness nor edema. No gross deformities of extremities. Neurologic:  Normal speech and language (improved from prior). Left face asymmetry. No sensory deficit. Left side weakness 4+/5 upper and lower extremities.  Skin:  Skin is warm, dry and intact. No rash noted.   ____________________________________________   LABS (all labs ordered are listed, but only abnormal results are displayed)  Labs Reviewed  COMPREHENSIVE METABOLIC PANEL - Abnormal; Notable for the following components:      Result Value   CO2 19 (*)    Glucose, Bld 121 (*)    BUN 39 (*)    Creatinine, Ser 1.92 (*)    Total Bilirubin 1.3 (*)    GFR, Estimated 25 (*)    All other components within normal limits  I-STAT CHEM 8, ED - Abnormal; Notable for the following components:   BUN 43 (*)    Creatinine, Ser 1.90 (*)    Glucose, Bld 115 (*)    TCO2 19 (*)    All other components within normal limits  CBG MONITORING, ED - Abnormal; Notable for the following components:   Glucose-Capillary 106 (*)    All other components within normal limits  RESP PANEL BY RT-PCR  (FLU A&B, COVID) ARPGX2  PROTIME-INR  APTT  CBC  DIFFERENTIAL  ETHANOL  RAPID URINE DRUG SCREEN, HOSP PERFORMED  URINALYSIS, ROUTINE W REFLEX MICROSCOPIC   ____________________________________________  EKG   EKG Interpretation  Date/Time:  Wednesday August 27 2020 14:29:14 EDT Ventricular Rate:  90 PR Interval:  162 QRS Duration: 102 QT Interval:  411 QTC Calculation: 503 R Axis:   64 Text Interpretation: Sinus rhythm Prolonged QT interval Artifact in lead(s) I II III aVR aVL aVF V1 V2 V5 Confirmed by Nanda Quinton [phone]) on 08/27/2020 2:42:13 PM         ____________________________________________  RADIOLOGY  CT head reviewed.   ____________________________________________   PROCEDURES  Procedure(s) performed:   Procedures  None  ____________________________________________   INITIAL IMPRESSION / ASSESSMENT AND PLAN / ED COURSE  Pertinent labs & imaging results that were available during my care of the patient were reviewed by me and considered in my medical decision making (see chart for details).  Patient arrives to the emergency department as a code stroke.  After further investigation the patient appears to have awoken with symptoms.  She does have an improving exam clinically.  Not a tPA candidate. No findings to suspect LVO.  Patient evaluated in conjunction with neurology immediately upon arrival who accompanied her to CT.  No acute findings on CT imaging of the head.  Plan for MRI and admit for stroke evaluation.    Discussed patient's case with [facility] to request admission. Patient and family (if present) updated with plan. [facility] transferred to [facility].  I reviewed all nursing notes, vitals, pertinent old records, EKGs, labs, imaging (as available).  ____________________________________________  FINAL CLINICAL IMPRESSION(S) / ED DIAGNOSES  Final diagnoses:  Stroke-like symptoms    MEDICATIONS GIVEN DURING THIS VISIT:  Medications   clopidogrel (PLAVIX) tablet 300 mg (has no administration in time range)  clopidogrel (PLAVIX) tablet 75 mg (has no administration in time range)  sodium chloride flush (NS) 0.9 % injection 3 mL (3 mLs Intravenous Given 08/27/20 1431)    Note:  This document was prepared using Dragon voice recognition software and may include unintentional dictation errors.  Nanda Quinton, MD, [facility] Emergency Medicine    Aniruddh Ciavarella, [facility], MD 08/31/20 [phone]

## 2020-08-27 NOTE — H&P (Addendum)
Date: 08/27/2020               Patient Name:  Claudia Rasmussen MRN: [id number]  DOB: 01/16/1932 Age / Sex: 87 y.o., female   PCP: Marge Duncans, [name]              Medical Service: Internal Medicine Teaching Service              Attending Physician: Dr. Evette Doffing, Mallie Mussel,    First Contact: Alena Bills, [place] [facility] Pager: [phone]  Second Contact: Dr. Delene Ruffini Pager: [phone]  Third Contact Dr. Maudie Mercury Pager: [phone]       After Hours (After 5p/  First Contact Pager: [phone]  weekends / holidays): Second Contact Pager: [phone]   Chief Complaint: Facial droop and difficulty speaking  History of Present Illness:  Claudia Rasmussen is a 87 y.o. person with a history of cerebral infarction, hyperlipidemia, recent discontinuation of plavix, who presents to the ED after and an episode of facial drooping, difficulty speaking, and leg weakness this morning.    Approximately one week ago, she had a fall which resulted in a hairline fracture of the metatarsal bones of her right lower extremity. Since her fracture, she has had difficulty walking.  Yesterday, she was coming out of her kitchen and noticed herself feeling dizzy and falling to the right. She has a low wall separating her kitchen and living room, and she leaned onto the wall for support. She reports having to exert considerable mental effort to "tell" her legs to keep standing, and she stated she had to hold most of her weight up with her non-injured left leg. After this episode she fell asleep in her chair for several hours, and she stated she felt better after this nap. After the nap, she was able to walk to and from an entertainment show with no difficulties or right sided deviation, and her total distance traveled was reported to be about 0.25 miles.  This morning, she awoke and felt that her left leg was weak from her fall yesterday. She was picked up in a vehicle to go to a meeting later in the morning, and upon  arriving at the meeting she began to notice herself drifting to the right again while walking. While she was at the meeting around approximately 11:30, she was noticed to have difficulty speaking and a facial droop. She returned home form the meeting around noon and made a sandwich, but the bread started to become stuck in her cheek, and she felt she had a hard time getting her tongue to do what she wanted to do in order to remove the bread from remaining stuck in her cheek.  Due to her symptoms, she called her daughter. EMS was called, and she was brought here today. She currently denies numbness, tingling, weakness, difficulty raising her arms, difficulty swallowing, and easy bruising or bleeding. When asked if she has ever been told if she has had a stroke before, she states she had two episodes of double vision in the past and was placed on plavix as a result. She reports her cardiologist recently took her off plavix. She feels that her speech has improved but has not completely returned to her baseline.   In addition to her acute symptoms concerning her issues walking, facial drooping, and trouble speaking, she notes occasional episodes of palpitations accompanied by a feeling of pressure in her chest which radiates towards her neck. She has had two episodes which both occurred during  separate nights and one episode while she was with other people. She also endorses some shortness of breath which has been going on for some time. She denies feelings of anxiety, nausea, vomiting, or lightheadedness during these episodes.  Meds: Aspirin EC 81 mg daily Nadolol 20 mg daily Simvastatin 10 mg daily Synthroid 75 mcg daily Bethanecol 25 mg daily Cetirizine 10 mg daily Loratadine 10 mg daily Cholecalciferol 25 mcg daily Probiotic daily  Allergies: Metronidazol (rash, thick tongue and lips, dizziness) TMP/SMX (metallic taste and nausea)  Allergies as of 08/27/2020 - Review Complete 08/27/2020   Allergen Reaction Noted   Shellfish allergy Other (See Comments) 07/26/2017   Avelox [moxifloxacin hcl in nacl]  12/20/2015   Cephalosporins Other (See Comments) 12/20/2015   Levaquin [levofloxacin]  06/14/2019   Nitrofuran derivatives Other (See Comments) 06/16/2020   Azithromycin Rash 12/20/2015   Moxifloxacin hcl Rash 12/20/2015   Phenobarbital Rash 12/20/2015    Past Medical History:  Diagnosis Date   Age-related osteoporosis without current pathological fracture    Arthritis    Atrophy of thyroid (acquired)    Blepharitis 2021   Cholelithiases    Clostridium difficile infection    Diverticulitis    Diverticulitis of large intestine without perforation or abscess without bleeding    Family history of colon cancer    Migraine without aura, not intractable, without status migrainosus    Mixed hyperlipidemia    Stroke ([place])    Thyroid disease    UTI (urinary tract infection)    Vaginal prolapse    Vitamin D deficiency, unspecified    Family History:  Cerebral Infarction: Grandmother (maternal) Heart Disease: Mother Cancer: Father (colon cancer), Brother (prostate), Mother (Breast) HTN: Son (with hx of aortic dissection) DM: Grandmother (maternal), Son  Social History:  Claudia Rasmussen lives alone at [place] and 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 The club donates approximately 300 quilts yearly. She additionally states she enjoys walking to stay active.  She does have a 30 pack year history of tobacco use, but she quit 25 years ago. She consumes one glass of white wine per week, and denies any recreational drug use. She drinks 1 cup of coffee per day and occasionally drinks Pepsi.  Review of Systems: Consitutional: no fevers or recent sick contacts. HEENT: patient wears hearing aids but denies changes in hearing, no sudden vision loss. Respiratory: as per HPI. Cardiovascular: as per  HPI. Gastrointestinal: she reports constipation but denies nausea, vomiting, and diarrhea. No abdominal pain. Genitourinary: she reports urinating frequently due to her Bethanecol. Neurologic: as per HPI. Psychiatric: patient states her recent symptoms have been traumatizing but is otherwise well and active with her social community. Endocrine: she reports dry skin and nails, but denies hair changes, heat or cold intolerance. Musculoskeletal: right lower extremity pain 2/2 fracture. Hematologic: as per HPI. Skin: dry skin.  Physical Exam: Blood pressure 140/78, pulse 80, temperature 97.9 F (36.6 C), temperature source Oral, resp. rate 17, height 5\' 4"  (1.626 m), weight 59.4 kg, SpO2 98 %.  Constitutional: well-appearing and sitting in bed comfortably, in no acute distress HEENT: normocephalic atraumatic Eyes: conjunctiva non-erythematous Neck: supple Cardiovascular: regular rate and rhythm, no m/r/g. No JVD. 2+ left sided radial and dorsalis pedis pulses. Pulmonary/Chest: normal work of breathing on room air, lungs clear to auscultation bilaterally Abdominal: soft, non-tender, non-distended, bowel sounds present Extremities: no edema. MSK: normal bulk and tone Neurological: alert &  oriented x 3, cranial nerves 2-12 intact bilaterally with some left facial weakness, 5/5 present in bilateral upper extremities, able to lift right leg off bed without difficulty, some weakness in left leg when raising leg off bed, 5/5 plantar and dorsiflexion of the left lower extremity, unable to acces right lower extremity plantar/dorsiflexion due to fracture Skin: warm and dry Psych: appropriate mood and affect  EKG: patient in normal sinus rhythm  PT/INR: 13.1/1.0  aPTT: 30  MR Brain w/o Contrast: results pending  MR Angio Head w/o Contrast: results pending  MR Angio Neck w/o Contrast: results pending  Assessment & Plan by Problem: Principal Problem:   Cerebral Infarction vs. TIA  Cerebral  Infarction vs. TIA Patient here with symptoms of facial dropping and difficulty speaking earlier today consistent with potential cerebral infarction vs. TIA. Facial dropping and difficulty speaking have improved but have not returned to baseline according to the patient. She also continues to have left lower extremity weakness. Recent discontinuation of plavix, history of hyperlipidemia, and family history of heart disease, including aortic dissection which make thrombotic cerebral infarction likely. Remote history of transient episodes of double vision for which she was placed on plavix make embolic and transient ischemic attack possible. Results from imaging studies of brain, head, and neck are still pending and will determine if infarction has occurred. Neurology is following and will appreciate recommendations. -Recommendations per Neurology:  -HgbA1c, FLP  -Echocardiogram  -Start plavix 75 mg daily after 300 mg load in addition to continuing Aspirin 81 mg  -Continue on telemetry  -Consult PT, OT, Speech -Results of MR Brain w/o contrast, MR Angio Head w/o contrast, and MR Angio Neck w/o contrast pending  Chronic Kidney Disease BUN 43. Cr 1.9 up from baseline of 1.7. -Monitor with daily BMP  Hypothyroidism On synthroid 75 mcg at home.  DVT Prophx: SCD Diet: Normal, fluid consistency: thin Code: Full  Dispo: Admit patient to Inpatient with expected length of stay greater than 2 midnights.  Signed: Archer Asa, Medical Student 08/27/2020, 6:30 PM  Pager: @[phone] @   Attestation for Student Documentation:  I personally was present and performed or re-performed the history, physical exam and medical decision-making activities of this service and have verified that the service and findings are accurately documented in the student's note.  Delene Ruffini, MD 08/27/2020, 8:09 PM

## 2020-08-27 NOTE — Hospital Course (Addendum)
About a week ago had a fall and had a hairline fracture of the metatarsal bones, and was having difficulty walking and having dizzy spells. She noted that she would continually deviate to the R, and had a very concerning dizzy spell yesterday where she was coming out of her kitchen, and fell to the right and leaned into the wall. After this episode she fell asleep in her chair for several hours, which she felt was helpful. She also went to an entertainment show that she walked to and back. She didn't notice any deviations at this time. She woke up this morning and felt that her LLE was weak, which she attributed to the fall. As she was going to a meeting later this morning the weakness continued and she noticed slurring of her speech approximately 11:30. She rode back to her home around noon where she made a sandwich, but the bread was stuck in her cheek, and she could not mover her tongue to "unstick" the bread.   Denies numbness, tingling, difficulty raising her arms, difficulty swallowing, weight loss, n/v/d, bleeding, or bruising   Endorses occasional palpitations, SHOB, constipation  On Plavix for 2 episodes of double vision in the distant past. Went to a cardiologist who halted her medication. 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  Infarctions felt to be cryptogenic.

## 2020-08-27 NOTE — Code Documentation (Signed)
Patient was having some issues with her gate for the past few days. She had a fall about a week ago and fractured her right foot. She was having lunch with a friend at 54 am when her friend noticed her with slurred speech and facial droop. Pt had trouble speaking which has since resolved but does still have the droop. She had some slight left sided weakness. She was taken off Plavix a week ago because she was told by her provider she had been on it too long. [facility] arrived and activated a code stroke. Pt was taken to [facility] and met by the EDP and stroke team. She was cleared for a CT. NIHSS 4 for facial palsy, left leg drift and dysarthria. Since LKW was unclear (symptoms started within the past few days) pt was not a candidate for TPA. Not a concern for LVO per MD.  Care Plan: q2x12 then q4 vitals/mNIHSS, stroke workup. Hand off with Matt Holmes.   Sharmon Cheramie, Rande Brunt, RN  Stroke Response Nurse

## 2020-08-27 NOTE — ED Triage Notes (Signed)
Pt arrives via ems as code stroke, per ems pt was eating lunch around 11:30am friend noticed pt with speech difficulty and left facial droop.  After a few moments speech returned to baseline, mild left facial droop remains.  Pt reports hx stroke in past, recently stopped taking plavix one month ago.  Hx recent falls, sustained fx to right foot one week ago, brace in place.  Neuro and ED physician examined on arrival and brought to CT with neuro team. Last seen well in question due to  past few days of gait changes, generalized weakness and vision changes.

## 2020-08-27 NOTE — Consult Note (Signed)
Neurology Consultation Reason for Consult: Facial droop Referring Physician: Long, J  CC: Facial droop  History is obtained from: Patient  HPI: Claudia Rasmussen is a 87 y.o. female who states that she was last normal couple of days ago when she began having some difficulty with walking, veering towards the left.  She has a right foot fracture, but she states that her walking was different over the past couple of days.  Today, she had sudden onset slurred speech and facial droop at 11:30 AM.  This improved some and she began to be able to speak again about 5 minutes later (suspect this is more due to dysarthria than aphasia) but she has had a persistent left facial droop.   LKW: Unclear, but at least yesterday tpa given?: no, unclear time of onset    ROS: A 14 point ROS was performed and is negative except as noted in the HPI.  Past Medical History:  Diagnosis Date   Age-related osteoporosis without current pathological fracture    Arthritis    Atrophy of thyroid (acquired)    Blepharitis 2021   Cholelithiases    Clostridium difficile infection    Diverticulitis    Diverticulitis of large intestine without perforation or abscess without bleeding    Family history of colon cancer    Migraine without aura, not intractable, without status migrainosus    Mixed hyperlipidemia    Stroke [facility])    Thyroid disease    UTI (urinary tract infection)    Vaginal prolapse    Vitamin D deficiency, unspecified      Family History  Problem Relation Age of Onset   Breast cancer Mother    Colon cancer Father    Prostate cancer Brother    Coronary artery disease Other    Hypertension Other    Esophageal cancer Neg Hx    Rectal cancer Neg Hx    Stomach cancer Neg Hx      Social History:  reports that she has quit smoking. She has never used smokeless tobacco. She reports current alcohol use. She reports that she does not use drugs.   Exam: Current vital signs: BP (!) 143/102 (BP  Location: Right Arm)   Pulse 87   Temp 97.9 F (36.6 C) (Oral)   Resp 16   Ht 5\' 4"  (1.626 m)   Wt 59.4 kg   SpO2 97%   BMI 22.48 kg/m  Vital signs in last 24 hours: Temp:  [97.5 F (36.4 C)-97.9 F (36.6 C)] 97.9 F (36.6 C) (07/06 1427) Pulse Rate:  [85-87] 87 (07/06 1427) Resp:  [16-18] 16 (07/06 1427) BP: (143-146)/(82-102) 143/102 (07/06 1427) SpO2:  [96 %-100 %] 97 % (07/06 1427) Weight:  [59.4 kg] 59.4 kg (07/06 1423)   Physical Exam  Constitutional: Appears well-developed and well-nourished.  Psych: Affect appropriate to situation Eyes: No scleral injection HENT: No OP obstruction MSK: no joint deformities.  Cardiovascular: Normal rate and regular rhythm.  Respiratory: Effort normal, non-labored breathing GI: Soft.  No distension. There is no tenderness.  Skin: WDI  Neuro: Mental Status: Patient is awake, alert, oriented to person, place, month, year, and situation. Patient is able to give a clear and coherent history. No signs of aphasia or neglect Cranial Nerves: II: Visual Fields are full. Pupils are equal, round, and reactive to light.   III,IV, VI: EOMI without ptosis or diploplia.  V: Facial sensation is symmetric to temperature VII: Facial movement with left facial weakness VIII: hearing is intact to  voice X: Uvula elevates symmetrically XI: Shoulder shrug is symmetric. XII: tongue is midline without atrophy or fasciculations.  Motor: Tone is normal. Bulk is normal. 5/5 strength was present in bilateral arms and right leg, she does have 4+/5 weakness of the left leg Sensory: Sensation is symmetric to light touch and temperature in the arms and legs. Cerebellar: FNF intact bilaterally, though she has significant shivering (states that she is cold   I have reviewed labs in epic and the results pertinent to this consultation are: Creatinine 1.9  I have reviewed the images obtained: CT head-negative  Impression: 87 year old female with difficulty  walking for a day who presents with sudden onset left facial weakness.  The sudden onset of today's symptoms are most consistent with an acute ischemic stroke, unclear what to make of the difficulty walking over the past couple of days, whether this represents worsening of previous symptoms versus more than one event.  I do think she is going to need to be admitted for secondary risk factor modification.  She was on dual antiplatelet therapy for years, but stopped the Plavix about a month ago.  Recommendations: - HgbA1c, fasting lipid panel - MRI, MRA  of the brain without contrast, MRA of the neck Wo contrast - Frequent neuro checks - Echocardiogram - Prophylactic therapy-Antiplatelet med: Aspirin - 81mg  and Plavix 75 mg daily after 300 mg load - Risk factor modification - Telemetry monitoring - PT consult, OT consult, Speech consult - Stroke team to follow    Roland Rack, MD [facility] [phone]  If 7pm- 7am, please page neurology on call as listed in [place].

## 2020-08-28 ENCOUNTER — Inpatient Hospital Stay (HOSPITAL_COMMUNITY): Payer: Medicare Other

## 2020-08-28 DIAGNOSIS — M7989 Other specified soft tissue disorders: Secondary | ICD-10-CM

## 2020-08-28 DIAGNOSIS — I6389 Other cerebral infarction: Secondary | ICD-10-CM

## 2020-08-28 DIAGNOSIS — I639 Cerebral infarction, unspecified: Secondary | ICD-10-CM

## 2020-08-28 LAB — BASIC METABOLIC PANEL
Anion gap: 7 (ref 5–15)
BUN: 37 mg/dL — ABNORMAL HIGH (ref 8–23)
CO2: 19 mmol/L — ABNORMAL LOW (ref 22–32)
Calcium: 9.4 mg/dL (ref 8.9–10.3)
Chloride: 112 mmol/L — ABNORMAL HIGH (ref 98–111)
Creatinine, Ser: 1.74 mg/dL — ABNORMAL HIGH (ref 0.44–1.00)
GFR, Estimated: 28 mL/min — ABNORMAL LOW (ref >60)
Glucose, Bld: 107 mg/dL — ABNORMAL HIGH (ref 70–99)
Potassium: 3.5 mmol/L (ref 3.5–5.1)
Sodium: 138 mmol/L (ref 135–145)

## 2020-08-28 LAB — CBC
HCT: 35 % — ABNORMAL LOW (ref 36.0–46.0)
Hemoglobin: 11.3 g/dL — ABNORMAL LOW (ref 12.0–15.0)
MCH: 30.7 pg (ref 26.0–34.0)
MCHC: 32.3 g/dL (ref 30.0–36.0)
MCV: 95.1 fL (ref 80.0–100.0)
Platelets: 199 10*3/uL (ref 150–400)
RBC: 3.68 MIL/uL — ABNORMAL LOW (ref 3.87–5.11)
RDW: 13.5 % (ref 11.5–15.5)
WBC: 5.9 10*3/uL (ref 4.0–10.5)
nRBC: 0 % (ref 0.0–0.2)

## 2020-08-28 LAB — RAPID URINE DRUG SCREEN, HOSP PERFORMED
Amphetamines: NOT DETECTED
Barbiturates: NOT DETECTED
Benzodiazepines: NOT DETECTED
Cocaine: NOT DETECTED
Opiates: NOT DETECTED
Tetrahydrocannabinol: NOT DETECTED

## 2020-08-28 LAB — URINALYSIS, ROUTINE W REFLEX MICROSCOPIC
Bilirubin Urine: NEGATIVE
Glucose, UA: NEGATIVE mg/dL
Ketones, ur: NEGATIVE mg/dL
Nitrite: NEGATIVE
Protein, ur: NEGATIVE mg/dL
Specific Gravity, Urine: 1.011 (ref 1.005–1.030)
pH: 5 (ref 5.0–8.0)

## 2020-08-28 LAB — ECHOCARDIOGRAM COMPLETE
Area-P 1/2: 3.12 cm2
Height: 64 in
P 1/2 time: 449 msec
S' Lateral: 3.4 cm
Weight: 2095.25 oz

## 2020-08-28 LAB — LIPID PANEL
Cholesterol: 173 mg/dL (ref 0–200)
HDL: 64 mg/dL (ref >40)
LDL Cholesterol: 92 mg/dL (ref 0–99)
Total CHOL/HDL Ratio: 2.7 RATIO
Triglycerides: 85 mg/dL (ref <150)
VLDL: 17 mg/dL (ref 0–40)

## 2020-08-28 LAB — HEMOGLOBIN A1C
Hgb A1c MFr Bld: 5.4 % (ref 4.8–5.6)
Mean Plasma Glucose: 108.28 mg/dL

## 2020-08-28 MED ORDER — ATORVASTATIN CALCIUM 40 MG PO TABS
40.0000 mg | ORAL_TABLET | Freq: Every day | ORAL | Status: DC
  Administered 2020-08-28 – 2020-08-29 (×2): 40 mg via ORAL
  Filled 2020-08-28 (×2): qty 1

## 2020-08-28 NOTE — Progress Notes (Addendum)
RLE venous duplex has been completed.  Preliminary results messaged to [employer], Therapist, sports.  Results can be found under chart review under CV PROC. 08/28/2020 9:43 AM Monta Police RVT, RDMS

## 2020-08-28 NOTE — Evaluation (Signed)
Occupational Therapy Evaluation Patient Details Name: Claudia Rasmussen MRN: [id number] DOB: 08-Aug-1931 Today's Date: 08/28/2020    History of Present Illness This 87 y.o. female admitted with 2 day h/o ataxia, facial droop and slurred speech.  MRI of brain shows bil. tiny punctate cerebral and Lt cerebellar infarct.  PMH includes: recent foot fx due to fall, h/o CVA, migraine, OA   Clinical Impression   Patient evaluated by Occupational Therapy with no further acute OT needs identified. All education has been completed and the patient has no further questions. Pt appears to be back to baseline with no obvious deficits noted.  She is able to perform ADLs mod I, and did well with cognitive assessment.  See below for any follow-up Occupational Therapy or equipment needs. OT is signing off. Thank you for this referral.     Follow Up Recommendations  No OT follow up    Equipment Recommendations  None recommended by OT    Recommendations for Other Services       Precautions / Restrictions Precautions Precautions: Fall Precaution Comments: mild fall risk due to h/o recent falls      Mobility Bed Mobility Overal bed mobility: Modified Independent                  Transfers Overall transfer level: Modified independent                    Balance                                           ADL either performed or assessed with clinical judgement   ADL Overall ADL's : Modified independent;At baseline                                       General ADL Comments: mod I due to foot fractures     Vision Baseline Vision/History: Wears glasses Wears Glasses: At all times Patient Visual Report: No change from baseline Vision Assessment?: Yes Eye Alignment: Within Functional Limits Ocular Range of Motion: Within Functional Limits Alignment/Gaze Preference: Within Defined Limits Tracking/Visual Pursuits: Able to track stimulus in all  quads without difficulty Saccades: Within functional limits Convergence: Within functional limits     Perception Perception Perception Tested?: Yes   Praxis Praxis Praxis tested?: Within functional limits    Pertinent Vitals/Pain Pain Assessment: No/denies pain     Hand Dominance Right   Extremity/Trunk Assessment Upper Extremity Assessment Upper Extremity Assessment: Overall WFL for tasks assessed   Lower Extremity Assessment Lower Extremity Assessment: Defer to PT evaluation   Cervical / Trunk Assessment Cervical / Trunk Assessment: Normal   Communication Communication Communication: HOH   Cognition Arousal/Alertness: Awake/alert Behavior During Therapy: WFL for tasks assessed/performed Overall Cognitive Status: Within Functional Limits for tasks assessed                                 General Comments: Pt scored 0/28 on the short Blessed Test which was administered while she was ambulating. She was able to perform path finding task independently   General Comments  discussed post stroke fatigue with pt and to slowly increase activity at home.    Exercises     Shoulder Instructions  Home Living Family/patient expects to be discharged to:: Private residence Living Arrangements: Alone Available Help at Discharge: Family;Available PRN/intermittently Type of Home: Independent living facility Home Access: Level entry     Home Layout: One level     Bathroom Shower/Tub: Occupational psychologist: Handicapped height     Home Equipment: Grab bars - tub/shower;Shower seat          Prior Functioning/Environment Level of Independence: Independent        Comments: Pt able to drive, ambulate, shop, perform ADLs and IADL independently        OT Problem List: Impaired balance (sitting and/or standing)      OT Treatment/Interventions:      OT Goals(Current goals can be found in the care plan section) Acute Rehab OT  Goals Patient Stated Goal: to go home OT Goal Formulation: All assessment and education complete, DC therapy  OT Frequency:     Barriers to D/C:            Co-evaluation PT/OT/SLP Co-Evaluation/Treatment: Yes Reason for Co-Treatment: For patient/therapist safety;To address functional/ADL transfers   OT goals addressed during session: ADL's and self-care      AM-PAC OT "6 Clicks" Daily Activity     Outcome Measure Help from another person eating meals?: None Help from another person taking care of personal grooming?: None Help from another person toileting, which includes using toliet, bedpan, or urinal?: None Help from another person bathing (including washing, rinsing, drying)?: None Help from another person to put on and taking off regular upper body clothing?: None Help from another person to put on and taking off regular lower body clothing?: None 6 Click Score: 24   End of Session Nurse Communication: Mobility status  Activity Tolerance: Patient tolerated treatment well Patient left: in bed;with call bell/phone within reach  OT Visit Diagnosis: Unsteadiness on feet (R26.81)                Time: 1224-8250 OT Time Calculation (min): 29 min Charges:  OT General Charges $OT Visit: 1 Visit OT Evaluation $OT Eval Low Complexity: 1 Low  Nilsa Nutting., OTR/L Acute Rehabilitation Services Pager [phone] Office [phone]   Lucille Passy M 08/28/2020, 5:03 PM

## 2020-08-28 NOTE — ED Notes (Signed)
Pt ambulated to bathroom with no difficulty Unable to maintain urine sample at this time

## 2020-08-28 NOTE — ED Notes (Signed)
Pt ambulated to the bathroom without incident.

## 2020-08-28 NOTE — ED Notes (Signed)
Patient transported to Ultrasound 

## 2020-08-28 NOTE — Progress Notes (Signed)
Patient is okay to be discharged today per neurology. Will arrange with TOC for transition back to facility. Will get cardiology to implant loop recorder tomorrow once transport is arranged.

## 2020-08-28 NOTE — Discharge Summary (Addendum)
Name: Claudia Rasmussen MRN: [id number] DOB: April 21, 1931 87 y.o. PCP: Marge Duncans, [name]  Date of Admission: 08/27/2020  2:02 PM Date of Discharge:  Attending Physician: Axel Filler, *  Discharge Diagnosis:  Principal Problem:   Cerebral infarction [facility]) Active Problems:   Mixed hyperlipidemia   Stage 3b chronic kidney disease ([place])   Discharge Medications: Allergies as of 08/29/2020       Reactions   Shellfish Allergy Other (See Comments)   Itching, Hives Itching, Hives   Avelox [moxifloxacin Hcl In Nacl]    Cephalosporins Other (See Comments)   Itching    Levaquin [levofloxacin]    Nitrofuran Derivatives Other (See Comments)   Made her not feel good, caused tremors, increased creatnine   Azithromycin Rash   Moxifloxacin Hcl Rash   Phenobarbital Rash        Medication List     STOP taking these medications    loratadine 10 MG tablet Commonly known as: CLARITIN   simvastatin 10 MG tablet Commonly known as: ZOCOR       TAKE these medications    aspirin 81 MG EC tablet Take 1 tablet (81 mg total) by mouth daily for 21 days. Swallow whole. Start taking on: August 30, 2020 What changed: additional instructions   atorvastatin 40 MG tablet Commonly known as: LIPITOR Take 1 tablet (40 mg total) by mouth daily. Start taking on: August 30, 2020   bethanechol 25 MG tablet Commonly known as: URECHOLINE Take 25 mg by mouth daily.   cetirizine 10 MG tablet Commonly known as: ZYRTEC TAKE 1 TABLET DAILY   clopidogrel 75 MG tablet Commonly known as: PLAVIX Take 1 tablet (75 mg total) by mouth daily. Start taking on: August 30, 2020   levothyroxine 75 MCG tablet Commonly known as: SYNTHROID TAKE 1 TABLET DAILY What changed: when to take this   PROBIOTIC-10 PO Take 2 tablets by mouth daily.   REFRESH OP Place 1 drop into both eyes in the morning and at bedtime.   Vitamin D3 25 MCG (1000 UT) Caps Take 1,000 Units by mouth daily.   vitamin E 1000  UNIT capsule Take 1,000 Units by mouth daily.        Disposition and follow-up:   Ms.Claudia Rasmussen was discharged from [facility] in Stable condition.  At the hospital follow up visit please address:  1.  Aspirin and plavix medications. Patient is to take ASA and Plavix for three weeks and then discontinue ASA, continue only plavix  - patient is also having loop recorder placed prior to discharge. She will need follow up with outpatient cardiology   2.  Labs / imaging needed at time of follow-up: none  3.  Pending labs/ test needing follow-up: none  Follow-up Appointments:  Follow up with PCP in 1 week Follow up with cardiology as outpatient for evaluation   Hospital Course by problem list:  1. Acute Cerebral infarction Patient presented with complaints of right sided leg weakness and facial dropping for one day. She was admitted for management of likely stroke. MRI showed multiple scattered small cerebral and cerebellar infarctions. She was startedon ASA 81 and Plavix 75mg . ECHO, head and neck MR angio head and neck were negative for thrombus/emboli. PT/OT/SLP evaluated and felt no recommendations were necessary. There is high suspicion for underlying a fib, but not seen on telemetry. She will have loop recorder placed prior to discharge back to [facility] with cardiology follow up as outpatient. Plan to continue ASA and  plavix for 3 weeks then Plavix alone after that point.   2. Mixed hyperlipidemia - Due to Claudia Rasmussen's admission for acute cerebral infarct, her statin was changed to atorvastatin. She will need to continue this medication as outpatient.   3. CKD stage 3 - patient was monitored with daily BMP  4. Hypothyroidism - patient was managed with synthroid  Discharge Exam:   BP (!) 159/83   Pulse 81   Temp 97.9 F (36.6 C) (Oral)   Resp 18   Ht 5\' 4"  (1.626 m)   Wt 59.4 kg   SpO2 96%   BMI 22.48 kg/m  Discharge exam:  Constitutional:  well-appearing and comfortably sitting in bed, in no acute distress Cardiovascular: regular rate and rhythm, no m/r/g, tele sinus rhythm. Pulmonary/Chest: normal work of breathing on room air Extrem: right lower displays mild edema. No erythema or tenderness present. Left lower extremity without edema or erythema. Neurological: alert & oriented, no slurring of speech or significant facial drooping noted. Smile symmetric. Strength 5/5 bilaterally Skin: warm and dry  Pertinent Labs, Studies, and Procedures:  MR head and neck, ECHO 2D, loop recorder placement  Discharge Instructions:  You were admitted to the hospital for stroke. We evaluated you for a potential source of the stroke. Due to your reports of chest palpitations, it is possible there may have been a blood clot that formed inside your heart which caused the stroke. We are treating you with aspirin 81, plavix 75mg , and atorvastatin. You will need to take the aspirin and plavix for three weeks. After three weeks, stop the aspirin, and continue taking only the plavix. Please also continue to take the atorvastatin. Please follow up with cardiology for your loop recorder  You also have history of hyperlipidemia for which you have been taking simvistatin. This was changed to atorvastatin following your stroke.    Wound care instructions (heart monitor) Keep incision clean and dry for 3 days.  You can remove outer dressing tomorrow. Leave steri-strips (little pieces of tape) on until seen in the office for wound check appointment. Call the office [phone]) for redness, drainage, swelling, or fever.  Signed: Delene Ruffini, MD 08/28/2020, 4:03 PM   Pager: @[phone] @

## 2020-08-28 NOTE — ED Notes (Signed)
Pt returned from [facility] [place]

## 2020-08-28 NOTE — Progress Notes (Signed)
STROKE TEAM PROGRESS NOTE   INTERVAL HISTORY Her  RN  is at the bedside.   She presented with 2-day history of gait ataxia followed by facial droop and slurred speech.  MRI scan of the brain shows bilateral tiny punctate cerebral and left cerebellar embolic infarcts.  MR angiogram of the brain and neck did not show large vessel stenosis or occlusion.  2D echo done on 07/10/2020 shows ejection fraction of 50 to 55%.  Vitals:   08/28/20 0900 08/28/20 0915 08/28/20 1015 08/28/20 1215  BP: 102/62  (!) 151/90 (!) 149/92  Pulse: 87 93 63 78  Resp: 18 15 20 15   Temp:      TempSrc:      SpO2: 94% 95% 95% 94%  Weight:      Height:       CBC:  Recent Labs  Lab 08/27/20 1407 08/27/20 1412 08/28/20 0351  WBC 6.3  --  5.9  NEUTROABS 4.5  --   --   HGB 12.1 12.2 11.3*  HCT 36.7 36.0 35.0*  MCV 94.3  --  95.1  PLT 189  --  952   Basic Metabolic Panel:  Recent Labs  Lab 08/27/20 1407 08/27/20 1412 08/28/20 0351  NA 137 139 138  K 4.1 4.3 3.5  CL 108 111 112*  CO2 19*  --  19*  GLUCOSE 121* 115* 107*  BUN 39* 43* 37*  CREATININE 1.92* 1.90* 1.74*  CALCIUM 9.7  --  9.4   Lipid Panel:  Recent Labs  Lab 08/28/20 0351  CHOL 173  TRIG 85  HDL 64  CHOLHDL 2.7  VLDL 17  LDLCALC 92   HgbA1c:  Recent Labs  Lab 08/28/20 0351  HGBA1C 5.4   Urine Drug Screen:  Recent Labs  Lab 08/28/20 0727  LABOPIA NONE DETECTED  COCAINSCRNUR NONE DETECTED  LABBENZ NONE DETECTED  AMPHETMU NONE DETECTED  THCU NONE DETECTED  LABBARB NONE DETECTED    Alcohol Level  Recent Labs  Lab 08/27/20 1440  ETH <10    IMAGING past 24 hours MR ANGIO HEAD WO CONTRAST  Result Date: 08/27/2020 CLINICAL DATA:  Slurred speech and left facial droop. EXAM: MRI HEAD WITHOUT CONTRAST MRA HEAD WITHOUT CONTRAST MRA NECK WITHOUT CONTRAST TECHNIQUE: IMPRESSION: 1. Scattered small acute infarcts throughout both cerebral hemispheres and left cerebellum suggesting emboli. 2. Extensive chronic small vessel  ischemic disease. 3. No major intracranial arterial occlusion or significant proximal intracranial stenosis. 4. Technically limited noncontrast neck MRA.   MR ANGIO NECK WO CONTRAST  Result Date: 08/27/2020 IMPRESSION: 1. Scattered small acute infarcts throughout both cerebral hemispheres and left cerebellum suggesting emboli. 2. Extensive chronic small vessel ischemic disease. 3. No major intracranial arterial occlusion or significant proximal intracranial stenosis. 4. Technically limited noncontrast neck MRA. Patent cervical carotid and vertebral arteries.   MR BRAIN WO CONTRAST  Result Date: 08/27/2020 IMPRESSION: 1. Scattered small acute infarcts throughout both cerebral hemispheres and left cerebellum suggesting emboli. 2. Extensive chronic small vessel ischemic disease. 3. No major intracranial arterial occlusion or significant proximal intracranial stenosis. 4. Technically limited noncontrast neck MRA. Patent cervical carotid and vertebral arteries.   [facility] [place] LOWER EXTREMITY VENOUS (DVT)  Result Date: 08/28/2020 Summary: RIGHT: - There is no evidence of deep vein thrombosis in the lower extremity. - There is no evidence of superficial venous thrombosis.  - No cystic structure found in the popliteal fossa.  LEFT: - No evidence of common femoral vein obstruction.     PHYSICAL EXAM Frail  pleasant elderly Caucasian lady not in distress.  She is mild hard of hearing bilaterally . Afebrile. Head is nontraumatic. Neck is supple without bruit.    Cardiac exam no murmur or gallop. Lungs are clear to auscultation. Distal pulses are well felt.  Neurological Exam: Awake alert oriented to time place and person.  Diminished attention, registration and recall.  Speech is clear without dysarthria or aphasia.  Extraocular movements are full range without nystagmus.  Blinks to threat bilaterally.  Mild left lower facial weakness.  Tongue midline.  Motor system exam shows no upper extremity drift or focal weakness  with only slight diminished fine finger movements in the left hand and orbits right over left upper extremity.  Lower extremity exam shows mild bilateral proximal weakness right leg exam is limited due to the right foot pain due to fracture.  Mild finger-to-nose dysmetria on the left.  No knee to heel dysmetria.  Gait not tested  ASSESSMENT/PLAN Ms. Kenedi Cilia is a 87 y.o. female with history of recent right foot fracture, migraine without aura, prior stroke, presenting with difficulty walking, dysarthria.   Stroke:  Scattered small acute infarcts throughout bilateral cerebral hemispheres and left cerebellum likely emboli MRI: scattered small acute infarcts in both cerebral hemispheres MRA: head/neck: Scattered small acute infarcts throughout both cerebral hemispheres and left cerebellum.  Chronic small vessel ischemic disease 2D Echo (07/10/2020): EF 50-55%, new echo pending Loop recorder to evaluate for atrial fibrillation, patient has bilateral cortical infarcts.  There is a strong suspicion for atrial fibrillation.  Patient has a history of palpitations. Bil LE ultrasound: negative for DVTs LDL 92 HgbA1c 5.4 VTE prophylaxis - scds Diet: regular diet aspirin 81 mg daily prior to admission, now on aspirin 81 mg daily and clopidogrel 75 mg daily. Recommend aspirin and clopidogrel for 3 weeks then plavix alone Therapy recommendations:  pending Disposition:  pending  Hypertension Home meds:  none Stable Permissive hypertension (OK if < 220/120) but gradually normalize in 5-7 days Long-term BP goal normotensive  Hyperlipidemia Home meds:  simvastatin 10mg  daily LDL 92, goal < 70 Start atorvastatin 40mg  daily Continue statin at discharge   Other Stroke Risk Factors Advanced Age >/= 45  Cigarette smoker, former. Quit many years ago ETOH use, alcohol level <10, advised to drink no more than 1 drink a day Hx stroke: symptoms include diplopia was started on aspirin.  Developed  another brief episode of diplopia.  She was placed on Aspirin and plavix for many years. Migraines without aura,not intractable, without status migrainosus  Other Active Problems Hypothyroidism: synthroid 66mcg daily  Hospital day # 1  Lissy Olivencia-Simmons, ACNP-BC Stroke NP 08/28/2020 2:30pm  I have personally obtained history,examined this patient, reviewed notes, independently viewed imaging studies, participated in medical decision making and plan of care.ROS completed by me personally and pertinent positives fully documented  I have made any additions or clarifications directly to the above note. Agree with note above.  Patient has been under presented with 2-day history of gait difficulty and balance followed by slurred speech and facial weakness due to multiple bilateral cerebral and left cerebellar embolic infarcts.  Strong suspicion for underlying paroxysmal atrial fibrillation.  Recommend aspirin and Plavix for 3 weeks followed by Plavix alone and aggressive risk factor modification.  Loop recorder prior to discharge.  Antony Contras, MD Medical Director [place] Pager: [phone] 08/28/2020 3:48 PM  To contact Stroke Continuity provider, please refer to [email or web address] After hours, contact General Neurology

## 2020-08-28 NOTE — Progress Notes (Addendum)
Staff message for loop request was noted We are unable to place loop today prior to her pending discharge from the ER. I have made Dr. Leonie Man aware and she is scheduled for an out patient EP consult and discussion on monitoring strategies and possible loop. I have place her appointment in her AVS  If patient is here tomorrow (seems possibly waiting on transport), I will follow up from EP service tomorrow morning.  Claudia Standard, [name]

## 2020-08-28 NOTE — Evaluation (Signed)
Physical Therapy Evaluation Patient Details Name: Claudia Rasmussen MRN: [id number] DOB: [birth year] Today's Date: 08/28/2020   History of Present Illness  This 87 y.o. female admitted with 2 day h/o ataxia, facial droop and slurred speech on 08/27/20.  MRI of brain shows scattered bil. tiny punctate cerebral and Lt cerebellar infarct.  PMH includes: recent foot fx due to fall -wbat per pt, h/o CVA, migraine, OA  Clinical Impression   Pt admitted with scattered CVAs.  At baseline, pt is independent and lives at [place].  About 2 weeks ago, she slipped on wet floor and broke metatarsals in R foot - per pt still independent, wbat, and wearing boot when out in community.  She then had a near fall on Tuesday - feeling like she was going to the right - suspect this could be related to current CVA as pt is normally independent without falls.  Today, pt presenting with normal strength and independent transfers.  She was able to perform dynamic gait task safely and presents at near baseline level. She was educated on PT role and no further need for PT at this time - pt is in agreement with this plan.  PT will sign off at this time as pt is at baseline and demonstrates mobility necessary to return to ILF from therapy perspective.     Follow Up Recommendations No PT follow up (return to ILF)    Equipment Recommendations  None recommended by PT    Recommendations for Other Services       Precautions / Restrictions Precautions Precautions: Fall Precaution Comments: mild fall risk due to h/o recent falls Restrictions Other Position/Activity Restrictions: WBAT per pt on R - states has a boot to wear for longer distance      Mobility  Bed Mobility Overal bed mobility: Modified Independent                  Transfers Overall transfer level: Modified independent               General transfer comment: Pt has been ambulating to bathroom independently  Ambulation/Gait Ambulation/Gait  assistance: Supervision Gait Distance (Feet): 400 Feet Assistive device: None Gait Pattern/deviations: WFL(Within Functional Limits) Gait velocity: normal   General Gait Details: Had supervision during session but performed safely and has been ambulating to bathroom independently  Stairs            Wheelchair Mobility    Modified Rankin (Stroke Patients Only) Modified Rankin (Stroke Patients Only) Pre-Morbid Rankin Score: No symptoms Modified Rankin: No significant disability     Balance Overall balance assessment: Independent Sitting-balance support: No upper extremity supported Sitting balance-Leahy Scale: Normal     Standing balance support: No upper extremity supported Standing balance-Leahy Scale: Good               High level balance activites: Side stepping;Direction changes;Turns;Sudden stops;Head turns High Level Balance Comments: Pt completing above task safely and without LOB Standardized Balance Assessment Standardized Balance Assessment : Dynamic Gait Index   Dynamic Gait Index Level Surface: Normal Change in Gait Speed: Normal Gait with Horizontal Head Turns: Normal Gait with Vertical Head Turns: Normal Gait and Pivot Turn: Normal Step Over Obstacle: Mild Impairment Step Around Obstacles: Mild Impairment Steps:  (did not perform)       Pertinent Vitals/Pain Pain Assessment: No/denies pain    Home Living Family/patient expects to be discharged to:: Private residence Living Arrangements: Alone Available Help at Discharge: Family;Available PRN/intermittently Type of Home: Independent living  facility Home Access: Level entry     Home Layout: One level Home Equipment: Grab bars - tub/shower;Shower seat      Prior Function Level of Independence: Independent         Comments: Pt able to drive, ambulate, shop, perform ADLs and IADL independently.  Even after foot fracture 2 weeks ago still independent.  Fall that resulted in foot  fracture was on wet surface when in a hurry.  Then reports near fall on Tuesday when she felt she was going to her R but was able to catch herself.  Otherwise, typically does not fall or lose balance.     Hand Dominance   Dominant Hand: Right    Extremity/Trunk Assessment   Upper Extremity Assessment Upper Extremity Assessment: Defer to OT evaluation    Lower Extremity Assessment Lower Extremity Assessment: LLE deficits/detail;RLE deficits/detail RLE Deficits / Details: ROM WFL; MMT 5/5 except did not test dorsiflexion past a 3/5 due to metatarsal fractures RLE Sensation: WNL RLE Coordination: WNL LLE Deficits / Details: ROM WFL; MMT 5/5 LLE Sensation: WNL LLE Coordination: WNL    Cervical / Trunk Assessment Cervical / Trunk Assessment: Normal  Communication   Communication: HOH  Cognition Arousal/Alertness: Awake/alert Behavior During Therapy: WFL for tasks assessed/performed Overall Cognitive Status: Within Functional Limits for tasks assessed                                 General Comments: Pt scored 0/28 on the short Blessed Test which was administered while she was ambulating. She was able to perform path finding task independently      General Comments General comments (skin integrity, edema, etc.): discussed post stroke fatigue with pt and to slowly increase activity at home. Also, discussed if felt further deficits or difficulty returning to normal activity to check with MD and could receive PT/OT    Exercises     Assessment/Plan    PT Assessment Patent does not need any further PT services  PT Problem List         PT Treatment Interventions      PT Goals (Current goals can be found in the Care Plan section)  Acute Rehab PT Goals Patient Stated Goal: to go home PT Goal Formulation: All assessment and education complete, DC therapy    Frequency     Barriers to discharge        Co-evaluation PT/OT/SLP Co-Evaluation/Treatment:  Yes Reason for Co-Treatment: For patient/therapist safety (seen in ED - pt with scattered infarcts and recent foot injury) PT goals addressed during session: Mobility/safety with mobility;Balance OT goals addressed during session: ADL's and self-care       AM-PAC PT "6 Clicks" Mobility  Outcome Measure Help needed turning from your back to your side while in a flat bed without using bedrails?: None Help needed moving from lying on your back to sitting on the side of a flat bed without using bedrails?: None Help needed moving to and from a bed to a chair (including a wheelchair)?: None Help needed standing up from a chair using your arms (e.g., wheelchair or bedside chair)?: None Help needed to walk in hospital room?: None Help needed climbing 3-5 steps with a railing? : A Little 6 Click Score: 23    End of Session Equipment Utilized During Treatment: Gait belt Activity Tolerance: Patient tolerated treatment well Patient left: in bed;with call bell/phone within reach Nurse Communication: Mobility status  Time: 2334-3568 PT Time Calculation (min) (ACUTE ONLY): 29 min   Charges:   PT Evaluation $PT Eval Low Complexity: 1 Low          Laverne Hursey, PT Acute Rehab Services Pager [phone] [facility] [phone]   Karlton Lemon 08/28/2020, 5:21 PM

## 2020-08-28 NOTE — ED Notes (Signed)
Pt provided a snack at the bedside.

## 2020-08-28 NOTE — Progress Notes (Signed)
  Echocardiogram 2D Echocardiogram has been performed.  Claudia Rasmussen 08/28/2020, 11:07 AM

## 2020-08-28 NOTE — Progress Notes (Signed)
Subjective:  Claudia Rasmussen was evaluated and examined at the bedside this morning. She reports that she is doing well and her speech has returned to her baseline. She denies any numbness, tingling, or focal weakness. She also denies palpitations and states that she has "never had any problems with my heart."  She states that she has not received a dose of her thyroid medicine yet, and the restarting of her thyroid medicine was discussed.  She reports that she has not been informed of the results of the imaging studies of her brain and neck, and the MR results were discussed with her. She was informed she had multiple small areas in her brain which were indicative that she had a small stroke.  Next steps to prevent any additional strokes were discussed, and Claudia Rasmussen was informed of our plan to work with the stroke service to figure out the most appropriate medication regimen for her moving forward. Additionally, her upcoming echocardiogram was dicussed. She was agreeable to the plan.  Objective:  Vital signs in last 24 hours: Vitals:   08/28/20 0728 08/28/20 0900 08/28/20 0915 08/28/20 1015  BP: (!) 151/89 102/62  (!) 151/90  Pulse: 85 87 93 63  Resp: 20 18 15 20   Temp:      TempSrc:      SpO2: 97% 94% 95% 95%  Weight:      Height:       Weight change:  No intake or output data in the 24 hours ending 08/28/20 1048  Constitutional: well-appearing and comfortably sitting in bed, in no acute distress Cardiovascular: regular rate and rhythm, no m/r/g Pulmonary/Chest: normal work of breathing on room air MSK: normal bulk and tone Extrem: right lower extremity is wrapped in an ace bandage and displays mild edema. No erythema or tenderness present. Left lower extremity without edema or erythema. Neurological: alert & oriented, no slurring of speech or significant facial drooping noted Skin: warm and dry  Assessment/Plan:  Principal Problem:   Cerebral infarction [facility]) Active  Problems:   Mixed hyperlipidemia   Stage 3b chronic kidney disease ([place])   Hypothyroidism  Claudia Rasmussen is an 87 y.o. person living with ischemic vascular disease and prior cerebral infarction, hyperlipidemia, recent discontinuation of plavix, and recent right lower extremity fracture with subsequent immobility, who is here for management of cerebral infarction.  Cerebral Infarction MRI indicates scattered, small acute infarcts throughout both cerebral hemispheres and left cerebellum suggesting an embolic etiology. Patient denies any history of heart problems or arrhythmia. Given patient's recent ankle fracture and immobility along with her recent embolic stroke, Doppler ultrasound of the right leg was performed, which was negative for DVT. A1c and FLP resulted within normal limits. Echocardiogram is pending, and results will help further elucidate if the embolic infarcts resulted from a cardiac origin. DAPT vs. Anti-coagulation has been discussed with stroke team and will appreciate their recommendations. Recommendations from PT and OT still pending. -Continue awaiting results of echocardiogram -Recommendations per stroke team  -Continue aspirin and plavix for 3 weeks then continue with plavix alone   -Loop recorder for paroxysmal atrial fibrillation at discharge  Mixed Hyperlipidemia Pt currently on a statin. -Increase statin to atorvastatin 40 mg daily.  Stage 3b Chronic Kidney Disease BUN has improved from 43 yesterday to 37 today. Cr has also improved from 1.9 yesterday to 1.74 today. -Continue daily BMP monitoring  Hypothyroidism On synthroid 75 mcg at home. -Start synthroid  DVT Prophx: SCD Diet: Normal, fluid consistency: thin Code:  Full   LOS: 1 day   Archer Asa, Medical Student 08/28/2020, 10:48 AM

## 2020-08-29 ENCOUNTER — Encounter (HOSPITAL_COMMUNITY)
Admission: EM | Disposition: A | Discharge status: Home / Self Care | Payer: Self-pay | Source: Home / Self Care | Attending: Student in an Organized Health Care Education/Training Program

## 2020-08-29 DIAGNOSIS — I6300 Cerebral infarction due to thrombosis of unspecified precerebral artery: Secondary | ICD-10-CM

## 2020-08-29 HISTORY — PX: LOOP RECORDER INSERTION: EP1214

## 2020-08-29 LAB — BASIC METABOLIC PANEL
Anion gap: 9 (ref 5–15)
BUN: 41 mg/dL — ABNORMAL HIGH (ref 8–23)
CO2: 21 mmol/L — ABNORMAL LOW (ref 22–32)
Calcium: 9.5 mg/dL (ref 8.9–10.3)
Chloride: 109 mmol/L (ref 98–111)
Creatinine, Ser: 2 mg/dL — ABNORMAL HIGH (ref 0.44–1.00)
GFR, Estimated: 24 mL/min — ABNORMAL LOW (ref >60)
Glucose, Bld: 101 mg/dL — ABNORMAL HIGH (ref 70–99)
Potassium: 3.9 mmol/L (ref 3.5–5.1)
Sodium: 139 mmol/L (ref 135–145)

## 2020-08-29 LAB — CBC
HCT: 35.4 % — ABNORMAL LOW (ref 36.0–46.0)
Hemoglobin: 11.9 g/dL — ABNORMAL LOW (ref 12.0–15.0)
MCH: 31.1 pg (ref 26.0–34.0)
MCHC: 33.6 g/dL (ref 30.0–36.0)
MCV: 92.4 fL (ref 80.0–100.0)
Platelets: 201 10*3/uL (ref 150–400)
RBC: 3.83 MIL/uL — ABNORMAL LOW (ref 3.87–5.11)
RDW: 13.6 % (ref 11.5–15.5)
WBC: 6.3 10*3/uL (ref 4.0–10.5)
nRBC: 0 % (ref 0.0–0.2)

## 2020-08-29 SURGERY — LOOP RECORDER INSERTION

## 2020-08-29 MED ORDER — LIDOCAINE-EPINEPHRINE 1 %-1:100000 IJ SOLN
INTRAMUSCULAR | Status: AC
  Filled 2020-08-29: qty 1

## 2020-08-29 MED ORDER — ASPIRIN 81 MG PO TBEC: 81.0000 mg | DELAYED_RELEASE_TABLET | Freq: Every day | ORAL | 0 refills | Status: AC

## 2020-08-29 MED ORDER — LIDOCAINE-EPINEPHRINE 1 %-1:100000 IJ SOLN
INTRAMUSCULAR | Status: DC | PRN
  Administered 2020-08-29: 15 mL

## 2020-08-29 MED ORDER — ATORVASTATIN CALCIUM 40 MG PO TABS: 40.0000 mg | ORAL_TABLET | Freq: Every day | ORAL | 0 refills | Status: DC

## 2020-08-29 MED ORDER — CLOPIDOGREL BISULFATE 75 MG PO TABS: 75.0000 mg | ORAL_TABLET | Freq: Every day | ORAL | 0 refills | Status: AC

## 2020-08-29 SURGICAL SUPPLY — 2 items
MONITOR MOBILE MNGR LINQ22 (Prosthesis & Implant Heart) ×2 IMPLANT
PACK LOOP INSERTION (CUSTOM PROCEDURE TRAY) ×2 IMPLANT

## 2020-08-29 NOTE — Discharge Instructions (Addendum)
Dear Claudia Rasmussen, Claudia Rasmussen were admitted to the hospital for stroke. We evaluated you for a potential source of the stroke. Due to your reports of chest palpitations, it is possible there may have been a blood clot that formed inside your heart which caused the stroke. We are treating you with aspirin 81, plavix 75mg , and atorvastatin. You will need to take the aspirin and plavix for three weeks. After three weeks, stop the aspirin, and continue taking only the plavix. Please also continue to take the atorvastatin. Please follow up with cardiology for your loop recorder.  You also have history of hyperlipidemia for which you have been taking simvistatin. This was changed to atorvastatin following your stroke.    Wound care instructions (heart monitor) Keep incision clean and dry for 3 days.  You can remove outer dressing tomorrow. Leave steri-strips (little pieces of tape) on until seen in the office for wound check appointment. Call the office [phone]) for redness, drainage, swelling, or fever.

## 2020-08-29 NOTE — Plan of Care (Signed)
  Problem: Education: Goal: Knowledge of disease or condition will improve Outcome: Adequate for Discharge Goal: Knowledge of secondary prevention will improve Outcome: Adequate for Discharge Goal: Knowledge of patient specific risk factors addressed and post discharge goals established will improve Outcome: Adequate for Discharge Goal: Individualized Educational Video(s) Outcome: Adequate for Discharge   Problem: Coping: Goal: Will verbalize positive feelings about self Outcome: Adequate for Discharge Goal: Will identify appropriate support needs Outcome: Adequate for Discharge   Problem: Health Behavior/Discharge Planning: Goal: Ability to manage health-related needs will improve Outcome: Adequate for Discharge   Problem: Education: Goal: Knowledge of General Education information will improve Description: Including pain rating scale, medication(s)/side effects and non-pharmacologic comfort measures Outcome: Adequate for Discharge   Problem: Health Behavior/Discharge Planning: Goal: Ability to manage health-related needs will improve Outcome: Adequate for Discharge   Problem: Clinical Measurements: Goal: Ability to maintain clinical measurements within normal limits will improve Outcome: Adequate for Discharge Goal: Will remain free from infection Outcome: Adequate for Discharge Goal: Diagnostic test results will improve Outcome: Adequate for Discharge Goal: Respiratory complications will improve Outcome: Adequate for Discharge Goal: Cardiovascular complication will be avoided Outcome: Adequate for Discharge   Problem: Activity: Goal: Risk for activity intolerance will decrease Outcome: Adequate for Discharge   Problem: Nutrition: Goal: Adequate nutrition will be maintained Outcome: Adequate for Discharge   Problem: Coping: Goal: Level of anxiety will decrease Outcome: Adequate for Discharge   Problem: Elimination: Goal: Will not experience complications  related to bowel motility Outcome: Adequate for Discharge Goal: Will not experience complications related to urinary retention Outcome: Adequate for Discharge   Problem: Pain Managment: Goal: General experience of comfort will improve Outcome: Adequate for Discharge   Problem: Safety: Goal: Ability to remain free from injury will improve Outcome: Adequate for Discharge   Problem: Skin Integrity: Goal: Risk for impaired skin integrity will decrease Outcome: Adequate for Discharge

## 2020-08-29 NOTE — Evaluation (Signed)
SLP Cancellation Note -  SCREEN  Patient Details Name: Claudia Rasmussen MRN: [id number] DOB: November 03, 1931   Cancelled treatment:       Reason Eval/Treat Not Completed: Other (comment) (note pt did well with OT/PT - cognitive skills intact per OT note, will screen.)  Kathleen Lime, MS [facility] SLP Acute Rehab Services Office [phone] Pager [phone]  Macario Golds 08/29/2020, 1:00 PM

## 2020-08-29 NOTE — Consult Note (Signed)
ELECTROPHYSIOLOGY CONSULT NOTE  Patient ID: Claudia Rasmussen MRN: [id number], DOB/AGE: October 07, 1931   Admit date: 08/27/2020 Date of Consult: 08/29/2020  Primary Physician: Marge Duncans, [name] Primary Cardiologist: Dr. Ferdinand Lango ([facility] Reason for Consultation: Cryptogenic stroke - ; recommendations regarding Implantable Loop Recorder, requested by Dr. Leonie Man  History of Present Illness Claudia Rasmussen was admitted on 08/27/2020 with gait instability/ataxia,, facial droop/slurred speech, found with stroke (multiple and bilateral)    She was recently referred to cardiology at [facility] saw Dr. Ferdinand Lango 07/09/20 for increased SOB/DOE, perhaps post COVID, + reports of some brief palpitations, planned for an echo I do not see any monitoring done  PMHx includes: HTN, TIAs, arthritis, migraine HAs, CKD  Neurology notes: Scattered small acute infarcts throughout bilateral cerebral hemispheres and left cerebellum likely emboli.  she has undergone workup for stroke including echocardiogram and carotid angio.  The patient has been monitored on telemetry which has demonstrated sinus rhythm with no arrhythmias.  I  Neurology has deferred TEE  Echocardiogram this admission demonstrated    IMPRESSIONS   1. Left ventricular ejection fraction, by estimation, is 45 to 50%. The  left ventricle has mildly decreased function. The left ventricle  demonstrates global hypokinesis. Left ventricular diastolic parameters are  consistent with Grade I diastolic  dysfunction (impaired relaxation).   2. Right ventricular systolic function is normal. The right ventricular  size is normal. There is normal pulmonary artery systolic pressure. The  estimated right ventricular systolic pressure is 94.7 mmHg.   3. The mitral valve is normal in structure. Trivial mitral valve  regurgitation. No evidence of mitral stenosis.   4. The aortic valve is tricuspid. Aortic valve regurgitation is mild. No  aortic stenosis is  present.   5. Aortic dilatation noted. There is mild dilatation of the ascending  aorta, measuring 43 mm.   6. The inferior vena cava is normal in size with greater than 50%  respiratory variability, suggesting right atrial pressure of 3 mmHg.   Lab work is reviewed. CKD noted   Prior to admission, the patient denies chest pain, shortness of breath, dizziness, palpitations, or syncope.  They are recovering from their stroke with plans to return to her ALF/SNF at discharge.   Past Medical History:  Diagnosis Date   Age-related osteoporosis without current pathological fracture    Arthritis    Atrophy of thyroid (acquired)    Blepharitis 2021   Cholelithiases    Clostridium difficile infection    Diverticulitis    Diverticulitis of large intestine without perforation or abscess without bleeding    Family history of colon cancer    Migraine without aura, not intractable, without status migrainosus    Mixed hyperlipidemia    Stroke [facility])    Thyroid disease    UTI (urinary tract infection)    Vaginal prolapse    Vitamin D deficiency, unspecified      Surgical History:  Past Surgical History:  Procedure Laterality Date   ABDOMINAL HYSTERECTOMY     APPENDECTOMY  1969   CATARACT EXTRACTION, BILATERAL Bilateral    right- 03/22/2018 and left 05/10/2018   COLONOSCOPY  05/12/2009   Colonic polyp status post polypectomy. Moderate predominanltly left colonic diverticulosis. Internal hemorrhoids   OOPHORECTOMY Left      Medications Prior to Admission  Medication Sig Dispense Refill Last Dose   aspirin EC 81 MG tablet Take 81 mg by mouth daily.   08/27/2020   bethanechol (URECHOLINE) 25 MG tablet Take 25 mg by mouth daily.  Past Week   cetirizine (ZYRTEC) 10 MG tablet TAKE 1 TABLET DAILY 90 tablet 3 Past Week   Cholecalciferol (VITAMIN D3) 25 MCG (1000 UT) CAPS Take 1,000 Units by mouth daily.   Past Week   levothyroxine (SYNTHROID) 75 MCG tablet TAKE 1 TABLET DAILY (Patient taking  differently: Take 75 mcg by mouth.) 90 tablet 0 Past Week   loratadine (CLARITIN) 10 MG tablet Take 10 mg by mouth daily.   Past Week   Polyvinyl Alcohol-Povidone (REFRESH OP) Place 1 drop into both eyes in the morning and at bedtime.   Past Week   Probiotic Product (PROBIOTIC-10 PO) Take 2 tablets by mouth daily.   Past Week   simvastatin (ZOCOR) 10 MG tablet TAKE 1 TABLET DAILY 90 tablet 3 Past Week   vitamin E 1000 UNIT capsule Take 1,000 Units by mouth daily.       Inpatient Medications:    stroke: mapping our early stages of recovery book   Does not apply Once   aspirin EC  81 mg Oral Daily   atorvastatin  40 mg Oral Daily   clopidogrel  75 mg Oral Daily    Allergies:  Allergies  Allergen Reactions   Shellfish Allergy Other (See Comments)    Itching, Hives Itching, Hives   Avelox [Moxifloxacin Hcl In Nacl]    Cephalosporins Other (See Comments)    Itching    Levaquin [Levofloxacin]    Nitrofuran Derivatives Other (See Comments)    Made her not feel good, caused tremors, increased creatnine   Azithromycin Rash   Moxifloxacin Hcl Rash   Phenobarbital Rash    Social History   Socioeconomic History   Marital status: Widowed    Spouse name: Not on file   Number of children: 3   Years of education: Not on file   Highest education level: Not on file  Occupational History   Not on file  Tobacco Use   Smoking status: Former    Pack years: 0.00   Smokeless tobacco: Never  Vaping Use   Vaping Use: Never used  Substance and Sexual Activity   Alcohol use: Yes    Comment: Drinks alcohol very infrequently. She typically consumes white wine.   Drug use: Never   Sexual activity: Not on file  Other Topics Concern   Not on file  Social History Narrative   Not on file   Social Determinants of Health   Financial Resource Strain: Not on file  Food Insecurity: Not on file  Transportation Needs: Not on file  Physical Activity: Not on file  Stress: Not on file  Social  Connections: Not on file  Intimate Partner Violence: Not on file     Family History  Problem Relation Age of Onset   Breast cancer Mother    Colon cancer Father    Prostate cancer Brother    Coronary artery disease Other    Hypertension Other    Esophageal cancer Neg Hx    Rectal cancer Neg Hx    Stomach cancer Neg Hx       Review of Systems: All other systems reviewed and are otherwise negative except as noted above.  Physical Exam: Vitals:   08/28/20 2042 08/29/20 0024 08/29/20 0350 08/29/20 0805  BP: (!) 157/90 (!) 141/92 (!) 153/87 117/82  Pulse: 85 80 78 78  Resp: 18 16 18 20   Temp: 97.9 F (36.6 C) 97.7 F (36.5 C)  (!) 97.5 F (36.4 C)  TempSrc: Oral Oral  Oral  SpO2: 96% 95% 97% 93%  Weight:      Height:        GEN- The patient is well appearing, alert and oriented x 3 today.   Head- normocephalic, atraumatic Eyes-  Sclera clear, conjunctiva pink Ears- hearing intact Oropharynx- clear Neck- supple Lungs- CTA b/l, normal work of breathing Heart- RRR, no murmurs, rubs or gallops  GI- soft, NT, ND Extremities- no clubbing, cyanosis, or edema MS- no significant deformity or atrophy Skin- no rash or lesion Psych- euthymic mood, full affect   Labs:   Lab Results  Component Value Date   WBC 6.3 08/29/2020   HGB 11.9 (L) 08/29/2020   HCT 35.4 (L) 08/29/2020   MCV 92.4 08/29/2020   PLT 201 08/29/2020    Recent Labs  Lab 08/27/20 1407 08/27/20 1412 08/29/20 0326  NA 137   < > 139  K 4.1   < > 3.9  CL 108   < > 109  CO2 19*   < > 21*  BUN 39*   < > 41*  CREATININE 1.92*   < > 2.00*  CALCIUM 9.7   < > 9.5  PROT 6.8  --   --   BILITOT 1.3*  --   --   ALKPHOS 76  --   --   ALT 16  --   --   AST 22  --   --   GLUCOSE 121*   < > 101*   < > = values in this interval not displayed.   No results found for: CKTOTAL, CKMB, CKMBINDEX, TROPONINI Lab Results  Component Value Date   CHOL 173 08/28/2020   CHOL 171 06/18/2020   CHOL 178 06/19/2019    Lab Results  Component Value Date   HDL 64 08/28/2020   HDL 72 06/18/2020   HDL 78 06/19/2019   Lab Results  Component Value Date   LDLCALC 92 08/28/2020   LDLCALC 87 06/18/2020   LDLCALC 86 06/19/2019   Lab Results  Component Value Date   TRIG 85 08/28/2020   TRIG 60 06/18/2020   TRIG 75 06/19/2019   Lab Results  Component Value Date   CHOLHDL 2.7 08/28/2020   CHOLHDL 2.4 06/18/2020   CHOLHDL 2.3 06/19/2019   No results found for: LDLDIRECT  No results found for: DDIMER   Radiology/Studies:  MR ANGIO HEAD WO CONTRAST Result Date: 08/27/2020 CLINICAL DATA:  Slurred speech and left facial droop. EXAM: MRI HEAD WITHOUT CONTRAST MRA HEAD WITHOUT CONTRAST MRA NECK WITHOUT CONTRAST TECHNIQUE: Multiplanar, multiecho pulse sequences of the brain and surrounding structures were obtained without intravenous contrast. Angiographic images of the Circle of Willis were obtained using MRA technique without intravenous contrast. Angiographic images of the neck were obtained using MRA technique without intravenous contrast. Carotid stenosis measurements (when applicable) are obtained utilizing NASCET criteria, using the distal internal carotid diameter as the denominator. COMPARISON:  CT head 08/27/2020 and MRI 02/19/2003 FINDINGS: MRI HEAD FINDINGS Brain: There are scattered small acute infarcts in both cerebral hemispheres with the largest involving the posterior limb of the right internal capsule. Additional punctate acute infarcts involve the right caudate head, left corona radiata, right cingulate gyrus, posterior right frontal lobe, right parietal lobe, and left cerebellar hemisphere. There are two chronic microhemorrhages in the right cerebellar hemisphere and one in the right thalamus, potentially secondary to chronic hypertension. Patchy and confluent T2 hyperintensities in the cerebral white matter bilaterally have greatly progressed from the 2004 MRI and are nonspecific but  compatible with  extensive chronic small vessel ischemic disease. There is moderate cerebral atrophy. No mass, midline shift, or extra-axial fluid collection is identified. Vascular: Major intracranial vascular flow voids are preserved. Skull and upper cervical spine: Unremarkable bone marrow signal. Sinuses/Orbits: Bilateral cataract extraction. Clear paranasal sinuses. Small right mastoid effusion. Other: None. MRA HEAD FINDINGS The study is mildly motion degraded. The intracranial vertebral arteries are patent to the basilar with the right being dominant. The basilar artery is widely patent. There are small posterior communicating arteries bilaterally. Both PCAs are patent without evidence of a significant proximal stenosis. The internal carotid arteries are patent from skull base to carotid termini without evidence of a significant stenosis. ACAs and MCAs are patent without evidence of a proximal branch occlusion or significant proximal stenosis. No aneurysm is identified. MRA NECK FINDINGS Assessment is limited by noncontrast technique and motion artifact. There is a standard 3 vessel aortic arch. The brachiocephalic and subclavian arteries are not well evaluated. Partial signal loss in the proximal portions of both common carotid arteries is likely artifactual. The mid to distal portions of both common carotid arteries are widely patent, as are the proximal portions of both internal carotid arteries. There is symmetric signal loss in the mid and distal cervical ICAs which is likely artifactual but precludes assessment of these regions. Antegrade flow is present in the vertebral arteries bilaterally with the right being dominant. Signal loss in the V1, proximal V2, and V3 segments bilaterally is likely artifactual but precludes assessment of these segments. The mid and distal V2 segments are widely patent bilaterally. IMPRESSION: 1. Scattered small acute infarcts throughout both cerebral hemispheres and left cerebellum  suggesting emboli. 2. Extensive chronic small vessel ischemic disease. 3. No major intracranial arterial occlusion or significant proximal intracranial stenosis. 4. Technically limited noncontrast neck MRA. Patent cervical carotid and vertebral arteries. Electronically Signed   By: Logan Bores M.D.   On: 08/27/2020 18:54    VAS [place] LOWER EXTREMITY VENOUS (DVT) Result Date: 08/28/2020  Lower Venous DVT Study Patient Name:  Claudia Rasmussen  Date of Exam:   08/28/2020 Medical Rec #: [id number]        Accession #:    [id number] Date of Birth: [birth year]        Patient Gender: F Patient Age:   88Y Exam Location[facility]  [facility] Procedure:      VAS [place] LOWER EXTREMITY VENOUS (DVT) Referring Phys: [id number] [place] --------------------------------------------------------------------------------  Indications: Swelling. Other Indications: Recent fall and right foot fracture. Comparison Study: No previous exams Performing Technologist: Jody Hill RVT, RDMS  Examination Guidelines: A complete evaluation includes B-mode imaging, spectral Doppler, color Doppler, and power Doppler as needed of all accessible portions of each vessel. Bilateral testing is considered an integral part of a complete examination. Limited examinations for reoccurring indications may be performed as noted. The reflux portion of the exam is performed with the patient in reverse Trendelenburg.   Summary: RIGHT: - There is no evidence of deep vein thrombosis in the lower extremity. - There is no evidence of superficial venous thrombosis.  - No cystic structure found in the popliteal fossa.  LEFT: - No evidence of common femoral vein obstruction.  *See table(s) above for measurements and observations. Electronically signed by Monica Martinez MD on 08/28/2020 at 5:42:46 PM.    Final     12-lead ECG SR All prior EKG's in EPIC reviewed with no documented atrial fibrillation  Telemetry SR  Assessment and Plan:  1.  Cryptogenic  stroke The patient presents with cryptogenic stroke.    I spoke at length with the patient and her daughter at bedside about monitoring for afib with either a 30 day event monitor or an implantable loop recorder.  Risks, benefits, and alteratives to implantable loop recorder were discussed with the patient today.   At this time, the patient is very clear in herdecision to proceed with implantable loop recorder.   Wound care was reviewed with the patient (keep incision clean and dry for 3 days).  Wound check follow up will be scheduled for the patient.  Please call with questions.   Smrithi Pigford Dyane Dustman, [name] 08/29/2020

## 2020-09-01 ENCOUNTER — Encounter (HOSPITAL_COMMUNITY): Payer: Self-pay | Admitting: Cardiology

## 2020-09-04 DIAGNOSIS — M84374D Stress fracture, right foot, subsequent encounter for fracture with routine healing: Secondary | ICD-10-CM | POA: Diagnosis not present

## 2020-09-04 DIAGNOSIS — S8264XD Nondisplaced fracture of lateral malleolus of right fibula, subsequent encounter for closed fracture with routine healing: Secondary | ICD-10-CM | POA: Diagnosis not present

## 2020-09-09 ENCOUNTER — Ambulatory Visit (INDEPENDENT_AMBULATORY_CARE_PROVIDER_SITE_OTHER): Payer: Medicare Other | Admitting: Physician Assistant

## 2020-09-09 ENCOUNTER — Other Ambulatory Visit: Payer: Self-pay

## 2020-09-09 ENCOUNTER — Encounter: Payer: Self-pay | Admitting: Physician Assistant

## 2020-09-09 VITALS — BP 148/88 | HR 72 | Temp 97.1°F | Resp 16 | Ht 63.0 in | Wt 131.0 lb

## 2020-09-09 DIAGNOSIS — N1832 Chronic kidney disease, stage 3b: Secondary | ICD-10-CM

## 2020-09-09 DIAGNOSIS — I10 Essential (primary) hypertension: Secondary | ICD-10-CM | POA: Diagnosis not present

## 2020-09-09 DIAGNOSIS — E038 Other specified hypothyroidism: Secondary | ICD-10-CM | POA: Diagnosis not present

## 2020-09-09 DIAGNOSIS — I6300 Cerebral infarction due to thrombosis of unspecified precerebral artery: Secondary | ICD-10-CM

## 2020-09-09 HISTORY — DX: Essential (primary) hypertension: I10

## 2020-09-09 MED ORDER — NEBIVOLOL HCL 2.5 MG PO TABS: 2.5000 mg | ORAL_TABLET | Freq: Every day | ORAL | 3 refills | Status: DC

## 2020-09-09 NOTE — Progress Notes (Signed)
Subjective:  Patient ID: Claudia Rasmussen, female    DOB: 05/28/1931  Age: 87 y.o. MRN: [id number]  Chief Complaint  Patient presents with   Hypothyroidism   Recent stroke    HPI  Pt with recent history of stroke - on 7/6 she had symptoms of weakness and slurred speech.  She was admitted to hospital for evaluation and treatment - she was found to have several small acute infarcts/emboli on brain MRI She states that her cardiologist Dr Ferdinand Lango had stopped her Plavis 1-2 months ago but of course now it has been restarted She has been seen and had a loop recorder device put in and would like referral to cardiologist within [facility] for further follow up She has no residual neurologic defecits  Pt with history of elevated bp - In our office bp had been stable but is elevated today - she states it had one time been 180/100 at another providers office but mostly ranging 140s/90s -- Do advise with her recent history of stroke that we begin low dose antihypertensive medication  Pt with history of stage 3B chronic kidney disease - is following with nephrology regularly   Pt with history of hypothyroidism - is currently on synthroid 72mcg - due for labwork     Current Outpatient Medications on File Prior to Visit  Medication Sig Dispense Refill   aspirin EC 81 MG EC tablet Take 1 tablet (81 mg total) by mouth daily for 21 days. Swallow whole. 21 tablet 0   atorvastatin (LIPITOR) 40 MG tablet Take 1 tablet (40 mg total) by mouth daily. 30 tablet 0   bethanechol (URECHOLINE) 25 MG tablet Take 25 mg by mouth daily.     cetirizine (ZYRTEC) 10 MG tablet TAKE 1 TABLET DAILY 90 tablet 3   Cholecalciferol (VITAMIN D3) 25 MCG (1000 UT) CAPS Take 1,000 Units by mouth daily.     clopidogrel (PLAVIX) 75 MG tablet Take 1 tablet (75 mg total) by mouth daily. 30 tablet 0   levothyroxine (SYNTHROID) 75 MCG tablet TAKE 1 TABLET DAILY 90 tablet 0   Polyvinyl Alcohol-Povidone (REFRESH OP) Place 1 drop into  both eyes in the morning and at bedtime.     Probiotic Product (PROBIOTIC-10 PO) Take 2 tablets by mouth daily.     No current facility-administered medications on file prior to visit.   Past Medical History:  Diagnosis Date   Age-related osteoporosis without current pathological fracture    Arthritis    Atrophy of thyroid (acquired)    Benign hypertension 09/09/2020   Blepharitis 2021   Cholelithiases    Clostridium difficile infection    Diverticulitis    Diverticulitis of large intestine without perforation or abscess without bleeding    Family history of colon cancer    Migraine without aura, not intractable, without status migrainosus    Mixed hyperlipidemia    Stroke [facility])    Thyroid disease    UTI (urinary tract infection)    Vaginal prolapse    Vitamin D deficiency, unspecified    Past Surgical History:  Procedure Laterality Date   ABDOMINAL HYSTERECTOMY     APPENDECTOMY  1969   CATARACT EXTRACTION, BILATERAL Bilateral    right- 03/22/2018 and left 05/10/2018   COLONOSCOPY  05/12/2009   Colonic polyp status post polypectomy. Moderate predominanltly left colonic diverticulosis. Internal hemorrhoids   LOOP RECORDER INSERTION N/A 08/29/2020   Procedure: LOOP RECORDER INSERTION;  Surgeon: Vickie Epley, MD;  Location: [facility];  Service:  Cardiovascular;  Laterality: N/A;   OOPHORECTOMY Left     Family History  Problem Relation Age of Onset   Breast cancer Mother    Colon cancer Father    Prostate cancer Brother    Coronary artery disease Other    Hypertension Other    Esophageal cancer Neg Hx    Rectal cancer Neg Hx    Stomach cancer Neg Hx    Social History   Socioeconomic History   Marital status: Widowed    Spouse name: Not on file   Number of children: 3   Years of education: Not on file   Highest education level: Not on file  Occupational History   Not on file  Tobacco Use   Smoking status: Former   Smokeless tobacco: Never  Vaping Use    Vaping Use: Never used  Substance and Sexual Activity   Alcohol use: Yes    Comment: Drinks alcohol very infrequently. She typically consumes white wine.   Drug use: Never   Sexual activity: Not on file  Other Topics Concern   Not on file  Social History Narrative   Not on file   Social Determinants of Health   Financial Resource Strain: Not on file  Food Insecurity: Not on file  Transportation Needs: Not on file  Physical Activity: Not on file  Stress: Not on file  Social Connections: Not on file    Review of Systems CONSTITUTIONAL: Negative for chills, fatigue, fever, unintentional weight gain and unintentional weight loss.  E/N/T: Negative for ear pain, nasal congestion and sore throat.  CARDIOVASCULAR: Negative for chest pain, dizziness, palpitations and pedal edema.  RESPIRATORY: Negative for recent cough and dyspnea.  GASTROINTESTINAL: Negative for abdominal pain, acid reflux symptoms, constipation, diarrhea, nausea and vomiting.  MSK: Negative for arthralgias and myalgias.  INTEGUMENTARY: Negative for rash.  NEUROLOGICAL: Negative for dizziness and headaches.  PSYCHIATRIC: Negative for sleep disturbance and to question depression screen.  Negative for depression, negative for anhedonia.       Objective:  BP (!) 148/88   Pulse 72   Temp (!) 97.1 F (36.2 C)   Resp 16   Ht 5\' 3"  (1.6 m)   Wt 131 lb (59.4 kg)   SpO2 92%   BMI 23.21 kg/m   BP/Weight 09/09/2020 04/02/5282 02/24/2438  Systolic BP 102 725 -  Diastolic BP 88 93 -  Wt. (Lbs) 131 - 130.95  BMI 23.21 - 22.48    Physical Exam PHYSICAL EXAM:   VS: BP (!) 148/88   Pulse 72   Temp (!) 97.1 F (36.2 C)   Resp 16   Ht 5\' 3"  (1.6 m)   Wt 131 lb (59.4 kg)   SpO2 92%   BMI 23.21 kg/m   GEN: Well nourished, well developed, in no acute distress  Neck: no JVD or masses -  Cardiac: RRR; no murmurs, rubs, or gallops,no edema - no significant varicosities Respiratory:  normal respiratory rate and  pattern with no distress - normal breath sounds with no rales, rhonchi, wheezes or rubs Psych: euthymic mood, appropriate affect and demeanor  Diabetic Foot Exam - Simple   No data filed      Lab Results  Component Value Date   WBC 6.3 08/29/2020   HGB 11.9 (L) 08/29/2020   HCT 35.4 (L) 08/29/2020   PLT 201 08/29/2020   GLUCOSE 101 (H) 08/29/2020   CHOL 173 08/28/2020   TRIG 85 08/28/2020   HDL 64 08/28/2020   LDLCALC  92 08/28/2020   ALT 16 08/27/2020   AST 22 08/27/2020   NA 139 08/29/2020   K 3.9 08/29/2020   CL 109 08/29/2020   CREATININE 2.00 (H) 08/29/2020   BUN 41 (H) 08/29/2020   CO2 21 (L) 08/29/2020   TSH 2.720 07/17/2020   INR 1.0 08/27/2020   HGBA1C 5.4 08/28/2020      Assessment & Plan:   1. Cerebral infarction due to thrombosis of precerebral artery (HCC) Continue current meds 2. Benign hypertension - CBC with Differential/Platelet - Comprehensive metabolic panel Start with low dose bystolic 2.5mg  qd Referral to cardiologist 3. Other specified hypothyroidism - TSH  4. Stage 3b chronic kidney disease ([place])  Continue follow up with nephrologist  Meds ordered this encounter  Medications   nebivolol (BYSTOLIC) 2.5 MG tablet    Sig: Take 1 tablet (2.5 mg total) by mouth daily.    Dispense:  90 tablet    Refill:  3    Order Specific Question:   Supervising Provider    AnswerRochel Brome [phone]     Orders Placed This Encounter  Procedures   CBC with Differential/Platelet   Comprehensive metabolic panel   TSH   Ambulatory referral to Cardiology     Follow-up: Return in about 4 weeks (around 10/07/2020) for follow up.  An After Visit Summary was printed and given to the patient.  Yetta Flock [facility] [phone]

## 2020-09-10 ENCOUNTER — Other Ambulatory Visit: Payer: Self-pay | Admitting: Physician Assistant

## 2020-09-10 ENCOUNTER — Other Ambulatory Visit: Payer: Self-pay | Admitting: *Deleted

## 2020-09-10 LAB — COMPREHENSIVE METABOLIC PANEL
ALT: 19 IU/L (ref 0–32)
AST: 21 IU/L (ref 0–40)
Albumin/Globulin Ratio: 1.6 (ref 1.2–2.2)
Albumin: 4.3 g/dL (ref 3.6–4.6)
Alkaline Phosphatase: 95 IU/L (ref 44–121)
BUN/Creatinine Ratio: 17 (ref 12–28)
BUN: 26 mg/dL (ref 8–27)
Bilirubin Total: 0.6 mg/dL (ref 0.0–1.2)
CO2: 19 mmol/L — ABNORMAL LOW (ref 20–29)
Calcium: 9.4 mg/dL (ref 8.7–10.3)
Chloride: 108 mmol/L — ABNORMAL HIGH (ref 96–106)
Creatinine, Ser: 1.55 mg/dL — ABNORMAL HIGH (ref 0.57–1.00)
Globulin, Total: 2.7 g/dL (ref 1.5–4.5)
Glucose: 89 mg/dL (ref 65–99)
Potassium: 5.3 mmol/L — ABNORMAL HIGH (ref 3.5–5.2)
Sodium: 140 mmol/L (ref 134–144)
Total Protein: 7 g/dL (ref 6.0–8.5)
eGFR: 32 mL/min/{1.73_m2} — ABNORMAL LOW (ref >59)

## 2020-09-10 LAB — CBC WITH DIFFERENTIAL/PLATELET
Basophils Absolute: 0.1 10*3/uL (ref 0.0–0.2)
Basos: 1 %
EOS (ABSOLUTE): 0.2 10*3/uL (ref 0.0–0.4)
Eos: 3 %
Hematocrit: 36.9 % (ref 34.0–46.6)
Hemoglobin: 12.1 g/dL (ref 11.1–15.9)
Immature Grans (Abs): 0.1 10*3/uL (ref 0.0–0.1)
Immature Granulocytes: 1 %
Lymphocytes Absolute: 1.9 10*3/uL (ref 0.7–3.1)
Lymphs: 28 %
MCH: 30.4 pg (ref 26.6–33.0)
MCHC: 32.8 g/dL (ref 31.5–35.7)
MCV: 93 fL (ref 79–97)
Monocytes Absolute: 0.6 10*3/uL (ref 0.1–0.9)
Monocytes: 8 %
Neutrophils Absolute: 4 10*3/uL (ref 1.4–7.0)
Neutrophils: 59 %
Platelets: 221 10*3/uL (ref 150–450)
RBC: 3.98 x10E6/uL (ref 3.77–5.28)
RDW: 12.7 % (ref 11.7–15.4)
WBC: 6.9 10*3/uL (ref 3.4–10.8)

## 2020-09-10 LAB — TSH: TSH: 4.42 u[IU]/mL (ref 0.450–4.500)

## 2020-09-11 ENCOUNTER — Other Ambulatory Visit: Payer: Self-pay

## 2020-09-11 ENCOUNTER — Encounter: Payer: Self-pay | Admitting: *Deleted

## 2020-09-11 ENCOUNTER — Ambulatory Visit (INDEPENDENT_AMBULATORY_CARE_PROVIDER_SITE_OTHER): Payer: Medicare Other

## 2020-09-11 DIAGNOSIS — I6300 Cerebral infarction due to thrombosis of unspecified precerebral artery: Secondary | ICD-10-CM

## 2020-09-11 DIAGNOSIS — M84374D Stress fracture, right foot, subsequent encounter for fracture with routine healing: Secondary | ICD-10-CM | POA: Diagnosis not present

## 2020-09-11 DIAGNOSIS — S8264XD Nondisplaced fracture of lateral malleolus of right fibula, subsequent encounter for closed fracture with routine healing: Secondary | ICD-10-CM | POA: Diagnosis not present

## 2020-09-11 NOTE — Progress Notes (Signed)
Wound check appointment s/p ILR implant 08/27/20 for cryptogenic stroke. Remote transmission 09/11/20 reviewed. Battery status: Good. 0 symptom episodes, 0 tachy episodes, 0 pause episodes, 0 brady episodes. 0 AF episodes (0% burden). Monthly summary reports scheduled with next transmission 10/06/20. Steri-strips removed. Mild bruising noted. No edema, redness, or drainage. Patient informed she may return to normal activities including showering. All questions answered. Patient provided device clinic contact. Will cont to monitor remotely.

## 2020-09-11 NOTE — Patient Instructions (Signed)
You may now shower.  Please keep your remote monitor charged and plugged in at bedside.  If you have any questions or concerns please don't hesitate to call the device clinic at [phone]

## 2020-09-11 NOTE — Patient Outreach (Signed)
[facility]) Care Management  [place]  09/11/2020   Claudia Rasmussen [birth year] [id number]   RED ON EMMI ALERT - Stroke Day # 9 Date: 7/19 Red Alert Reason: - Lost interest in things   Outreach attempt #1, successful.  Identity verified.  This care manager introduced self and stated purpose of call.  [facility] care management services explained.    Social: Member report living in [place].  She is provided support in the community, including meals daily.  She is able to perform all ADL's independently, has daughter that is in the area and supportive.  Addresses above red alert, referencing being easily tired due to recent stroke.  Denies depression, PHQ2 completed, state the fatigue is getting better.  Conditions: Per chart, has history of HTN, CVA, CKD, and HLD.  Had fall in June where right foot metatarsals were fractured, has ortho boot.  Medications: Reviewed with member, state she was recently placed on antihypertensive due to elevated BP at MD office.  Does not monitor daily, but will start getting it checked at the clinic.  Appointments: Had follow up with PCP on 7/19, will have another visit on 7/28 to assess medication management and BP.  Had loop recorder placed while in the hospital, will have wound check on 7/21.  Daughter provides transportation until ortho boot is removed.  Advance Directives: Daughter is POA, denies request to change.  Consent: Agrees to [facility] involvement.   Encounter Medications:  Outpatient Encounter Medications as of 09/10/2020  Medication Sig   aspirin EC 81 MG EC tablet Take 1 tablet (81 mg total) by mouth daily for 21 days. Swallow whole.   atorvastatin (LIPITOR) 40 MG tablet Take 1 tablet (40 mg total) by mouth daily.   bethanechol (URECHOLINE) 25 MG tablet Take 25 mg by mouth daily.   cetirizine (ZYRTEC) 10 MG tablet TAKE 1 TABLET DAILY   Cholecalciferol (VITAMIN D3) 25 MCG (1000 UT) CAPS Take 1,000 Units by mouth  daily.   clopidogrel (PLAVIX) 75 MG tablet Take 1 tablet (75 mg total) by mouth daily.   denosumab (PROLIA) 60 MG/ML SOSY injection Inject 60 mg into the skin every 6 (six) months.   levothyroxine (SYNTHROID) 75 MCG tablet TAKE 1 TABLET DAILY   nebivolol (BYSTOLIC) 2.5 MG tablet Take 1 tablet (2.5 mg total) by mouth daily.   Polyvinyl Alcohol-Povidone (REFRESH OP) Place 1 drop into both eyes in the morning and at bedtime.   Probiotic Product (PROBIOTIC-10 PO) Take 2 tablets by mouth daily.   [DISCONTINUED] levothyroxine (SYNTHROID) 75 MCG tablet TAKE 1 TABLET DAILY   No facility-administered encounter medications on file as of 09/10/2020.    Functional Status:  In your present state of health, do you have any difficulty performing the following activities: 08/28/2020 08/28/2020  Hearing? - Y  Vision? - N  Difficulty concentrating or making decisions? - N  Walking or climbing stairs? - N  Dressing or bathing? - N  Doing errands, shopping? N -  Some recent data might be hidden    Fall/Depression Screening: Fall Risk  09/10/2020 12/19/2019 09/25/2018  Falls in the past year? 1 0 0  Comment - - [place]i Telephone Survey: data to providers prior to load  Number falls in past yr: 0 0 -  Comment - - -  Injury with Fall? 1 0 -  Risk for fall due to : History of fall(s) No Fall Risks -  Follow up Falls evaluation completed;Falls prevention discussed - -  PHQ 2/9 Scores 09/10/2020 12/19/2019  PHQ - 2 Score 1 0  Exception Documentation (No Data) -    Assessment:   Care Plan Care Plan : Stroke (Adult)  Updates made by Valente David, RN since 09/11/2020 12:00 AM     Problem: Emotional Adjustment to Disease (Stroke)      Goal: Optimal Coping evidenced by patient report of increased family/friend interaction   Start Date: 09/10/2020  Expected End Date: 10/11/2020  Priority: Medium  Note:   7/20 - Explored the patient's and family's understanding of the disease; use open-ended questions.    Expressed empathy; listen actively by encouraging the patient and family to express feelings, concerns and fears; asked questions and encouraged open communication regarding embarrassing or disturbing topics.     Task: Support Psychosocial Response to Stroke   Due Date: 10/11/2020  Note:   Care Management Activities:    - counseling provided - decision-making supported - depression screen reviewed    Notes:     Problem: Recurrence (Stroke)      Long-Range Goal: Stroke Recurrence Prevented or Minimized evidenced by no hospitalizations   Start Date: 09/10/2020  Expected End Date: 12/10/2020  Priority: High  Note:   7/20 - Established baseline by comparing pre and poststroke level of function using age-appropriate criteria; consider history of transient ischemia attack.   Promoted long-term management of risk factors, such as tobacco use, sedentary lifestyle, obesity, sleep disordered breathing, unhealthy diet and high alcohol consumption.     Task: Optimize Stroke Recovery   Due Date: 12/10/2020  Note:   Care Management Activities:    - healthy lifestyle promoted - signs/symptoms of comorbidities monitored - stroke signs/symptoms assessed and compared to baseline    Notes:       Goals Addressed             This Visit's Progress    THN - Keep or Improve My Strength-Stroke       Timeframe:  Long-Range Goal Priority:  Medium Start Date:           7/20                  Expected End Date:    10/20                   Barriers: Knowledge  Follow Up Date 09/25/2020    - eat healthy to increase strength - know who to call for help if I fall - plan activity for times when energy is the highest    Why is this important?   Before the stroke you probably did not think much about being safe when you are up and about.  Now, it may be harder for you to get around.  It may also be easier for you to trip or fall.  It is common to have muscle weakness after a stroke. You  may also feel like you cannot control an arm or leg.  It will be helpful to work with a physical therapist to get your strength and muscle control back.  It is good to stay as active as you can. Walking and stretching help you stay strong and flexible.  The physical therapist will develop an exercise program just for you.     Notes:   7/20 - Discussed interventions to improve strength, such as exercises and adequate sleep.  Will follow up with ortho regarding removal of ortho boot and potential need for PT     THN -  Track and Manage My Blood Pressure-Hypertension       Timeframe:  Short-Term Goal Priority:  High Start Date:        7/20                     Expected End Date:       8/20  Barriers: Knowledge Other - lack of equipment                 Follow Up Date 09/25/2020    - check blood pressure 3 times per week - write blood pressure results in a log or diary    Why is this important?   You won't feel high blood pressure, but it can still hurt your blood vessels.  High blood pressure can cause heart or kidney problems. It can also cause a stroke.  Making lifestyle changes like losing a little weight or eating less salt will help.  Checking your blood pressure at home and at different times of the day can help to control blood pressure.  If the doctor prescribes medicine remember to take it the way the doctor ordered.  Call the office if you cannot afford the medicine or if there are questions about it.     Notes:   7/20 - Discussed importance of monitoring BP often with start of new medication.  Advised to have clinic at [facility] check and document in log.  BP log sent to member.        Plan:  Follow-up: Patient agrees to Care Plan and Follow-up. Follow-up in 2 week(s).    Valente David, [place], MSN [place] [phone]

## 2020-09-18 ENCOUNTER — Ambulatory Visit: Payer: Medicare Other | Admitting: Physician Assistant

## 2020-09-24 DIAGNOSIS — N8110 Cystocele, unspecified: Secondary | ICD-10-CM | POA: Diagnosis not present

## 2020-09-24 DIAGNOSIS — N390 Urinary tract infection, site not specified: Secondary | ICD-10-CM | POA: Diagnosis not present

## 2020-09-24 DIAGNOSIS — R339 Retention of urine, unspecified: Secondary | ICD-10-CM | POA: Diagnosis not present

## 2020-09-25 ENCOUNTER — Other Ambulatory Visit: Payer: Self-pay | Admitting: *Deleted

## 2020-09-25 DIAGNOSIS — M84374D Stress fracture, right foot, subsequent encounter for fracture with routine healing: Secondary | ICD-10-CM | POA: Diagnosis not present

## 2020-09-25 DIAGNOSIS — S8254XD Nondisplaced fracture of medial malleolus of right tibia, subsequent encounter for closed fracture with routine healing: Secondary | ICD-10-CM | POA: Diagnosis not present

## 2020-09-25 NOTE — Patient Outreach (Signed)
[place] [facility]) Care Management  09/25/2020  Emerly Viernes 25-Feb-1931 [id number]   Outgoing call placed to complete initial assessment.  Member state she is doing well, confirms receiving THN packet in the mail.  Daughter remains active with assisting member's care. Denies any urgent concerns, encouraged to contact this care manager with questions.  Agrees to follow up within the next month.   Goals Addressed             This Visit's Progress    THN - Keep or Improve My Strength-Stroke   On track    Timeframe:  Long-Range Goal Priority:  Medium Start Date:           7/20                  Expected End Date:    10/20                   Barriers: Knowledge  Follow Up Date 09/25/2020    - eat healthy to increase strength - know who to call for help if I fall - plan activity for times when energy is the highest    Why is this important?   Before the stroke you probably did not think much about being safe when you are up and about.  Now, it may be harder for you to get around.  It may also be easier for you to trip or fall.  It is common to have muscle weakness after a stroke. You may also feel like you cannot control an arm or leg.  It will be helpful to work with a physical therapist to get your strength and muscle control back.  It is good to stay as active as you can. Walking and stretching help you stay strong and flexible.  The physical therapist will develop an exercise program just for you.     Notes:   7/20 - Discussed interventions to improve strength, such as exercises and adequate sleep.  Will follow up with ortho regarding removal of ortho boot and potential need for PT  8/4 - Member has increased strength and independence, now able to wear a shoe a few times a week, able to bear weight on foot.    Will follow up with PCP on 8/16 and cardiology on 8/18     [facility] - Track and Manage My Blood Pressure-Hypertension   On track    Timeframe:  Short-Term  Goal Priority:  High Start Date:        7/20                     Expected End Date:       8/20  Barriers: Knowledge Other - lack of equipment                 Follow Up Date 09/25/2020    - check blood pressure 3 times per week - write blood pressure results in a log or diary    Why is this important?   You won't feel high blood pressure, but it can still hurt your blood vessels.  High blood pressure can cause heart or kidney problems. It can also cause a stroke.  Making lifestyle changes like losing a little weight or eating less salt will help.  Checking your blood pressure at home and at different times of the day can help to control blood pressure.  If the doctor prescribes medicine remember to take it the  way the doctor ordered.  Call the office if you cannot afford the medicine or if there are questions about it.     Notes:   7/20 - Discussed importance of monitoring BP often with start of new medication.  Advised to have clinic at [facility] check and document in log.  BP log sent to member.  8/4 - Report she has borrowed a blood pressure monitor to self monitor, will record in log daily.  Advised that management of BP will help with decreasing risk of recurrent stroke       Valente David, RN, MSN [facility] [phone]

## 2020-09-29 ENCOUNTER — Other Ambulatory Visit: Payer: Self-pay

## 2020-09-29 ENCOUNTER — Other Ambulatory Visit: Payer: Self-pay | Admitting: Physician Assistant

## 2020-09-29 MED ORDER — ATORVASTATIN CALCIUM 40 MG PO TABS: 40.0000 mg | ORAL_TABLET | Freq: Every day | ORAL | 0 refills | Status: DC

## 2020-09-29 MED ORDER — ATORVASTATIN CALCIUM 40 MG PO TABS: 40.0000 mg | ORAL_TABLET | Freq: Every day | ORAL | 1 refills | Status: DC

## 2020-09-29 NOTE — Telephone Encounter (Signed)
[facility] in [place].   Royce Macadamia, [place] 09/29/20 12:08 PM

## 2020-09-29 NOTE — Telephone Encounter (Signed)
Pt requesting short supply of medication sent to pharmacy pended.   Then 90 day supply sent to [facility]. She is out of medication. Please advise.   Royce Macadamia, [place] 09/29/20 10:44 AM

## 2020-09-29 NOTE — Telephone Encounter (Signed)
The pharmacy listed in the refill was mail in pharmacy --- will send that in Please let me know which pharmacy to send 30 day supply

## 2020-10-01 ENCOUNTER — Other Ambulatory Visit: Payer: Self-pay | Admitting: Physician Assistant

## 2020-10-02 DIAGNOSIS — M84374D Stress fracture, right foot, subsequent encounter for fracture with routine healing: Secondary | ICD-10-CM | POA: Diagnosis not present

## 2020-10-02 DIAGNOSIS — S8264XD Nondisplaced fracture of lateral malleolus of right fibula, subsequent encounter for closed fracture with routine healing: Secondary | ICD-10-CM | POA: Diagnosis not present

## 2020-10-03 DIAGNOSIS — N8110 Cystocele, unspecified: Secondary | ICD-10-CM | POA: Insufficient documentation

## 2020-10-03 DIAGNOSIS — N390 Urinary tract infection, site not specified: Secondary | ICD-10-CM | POA: Insufficient documentation

## 2020-10-03 DIAGNOSIS — K8020 Calculus of gallbladder without cholecystitis without obstruction: Secondary | ICD-10-CM | POA: Insufficient documentation

## 2020-10-03 DIAGNOSIS — E559 Vitamin D deficiency, unspecified: Secondary | ICD-10-CM | POA: Insufficient documentation

## 2020-10-03 DIAGNOSIS — A498 Other bacterial infections of unspecified site: Secondary | ICD-10-CM | POA: Insufficient documentation

## 2020-10-03 DIAGNOSIS — G43009 Migraine without aura, not intractable, without status migrainosus: Secondary | ICD-10-CM | POA: Insufficient documentation

## 2020-10-03 DIAGNOSIS — M1990 Unspecified osteoarthritis, unspecified site: Secondary | ICD-10-CM | POA: Insufficient documentation

## 2020-10-03 DIAGNOSIS — Z800 Family history of malignant neoplasm of digestive organs: Secondary | ICD-10-CM | POA: Insufficient documentation

## 2020-10-03 DIAGNOSIS — K5792 Diverticulitis of intestine, part unspecified, without perforation or abscess without bleeding: Secondary | ICD-10-CM | POA: Insufficient documentation

## 2020-10-03 DIAGNOSIS — E034 Atrophy of thyroid (acquired): Secondary | ICD-10-CM | POA: Insufficient documentation

## 2020-10-03 DIAGNOSIS — M810 Age-related osteoporosis without current pathological fracture: Secondary | ICD-10-CM | POA: Insufficient documentation

## 2020-10-03 DIAGNOSIS — K5732 Diverticulitis of large intestine without perforation or abscess without bleeding: Secondary | ICD-10-CM | POA: Insufficient documentation

## 2020-10-06 ENCOUNTER — Ambulatory Visit (INDEPENDENT_AMBULATORY_CARE_PROVIDER_SITE_OTHER): Payer: Medicare Other

## 2020-10-06 ENCOUNTER — Institutional Professional Consult (permissible substitution): Payer: Medicare Other | Admitting: Internal Medicine

## 2020-10-06 DIAGNOSIS — I6300 Cerebral infarction due to thrombosis of unspecified precerebral artery: Secondary | ICD-10-CM | POA: Diagnosis not present

## 2020-10-07 ENCOUNTER — Encounter: Payer: Self-pay | Admitting: Physician Assistant

## 2020-10-07 ENCOUNTER — Ambulatory Visit (INDEPENDENT_AMBULATORY_CARE_PROVIDER_SITE_OTHER): Payer: Medicare Other | Admitting: Physician Assistant

## 2020-10-07 ENCOUNTER — Other Ambulatory Visit: Payer: Self-pay

## 2020-10-07 VITALS — BP 112/68 | HR 66 | Temp 97.2°F | Ht 63.0 in | Wt 129.4 lb

## 2020-10-07 DIAGNOSIS — D492 Neoplasm of unspecified behavior of bone, soft tissue, and skin: Secondary | ICD-10-CM

## 2020-10-07 DIAGNOSIS — I10 Essential (primary) hypertension: Secondary | ICD-10-CM | POA: Diagnosis not present

## 2020-10-07 DIAGNOSIS — L299 Pruritus, unspecified: Secondary | ICD-10-CM | POA: Insufficient documentation

## 2020-10-07 LAB — CUP PACEART REMOTE DEVICE CHECK
Date Time Interrogation Session: 20220813182859
Implantable Pulse Generator Implant Date: 20220708

## 2020-10-07 NOTE — Progress Notes (Signed)
Subjective:  Patient ID: Claudia Rasmussen, female    DOB: [birth year]  Age: 87 y.o. MRN: [id number]  Chief Complaint  Patient presents with   Follow-up    4 week follow up on medication    HPI  Pt presents for follow up of hypertension. The patient is tolerating the medication well without side effects. Compliance with treatment has been good; including taking medication as directed , maintains a healthy diet and regular exercise regimen , and following up as directed. She is currently on bystolic 2.'5mg'$  qd - she has been taking her bp at home and is still ranging 13=20-130s/90s -- recheck here today is 112/68 and on my recheck 118/74 Pt does not have her bp cuff with her to see if calibrated Pt states she is feeling well --- does have appt with cardiology on Thursday to see Dr Harriet Masson Pt also has recordable loop implant  Pt has lesion on her scalp that has been there several months - at times bleeds - would like referral to dermatology  Current Outpatient Medications on File Prior to Visit  Medication Sig Dispense Refill   atorvastatin (LIPITOR) 40 MG tablet TAKE 1 TABLET(40 MG) BY MOUTH DAILY 90 tablet 1   bethanechol (URECHOLINE) 25 MG tablet Take 25 mg by mouth daily.     cetirizine (ZYRTEC) 10 MG tablet TAKE 1 TABLET DAILY 90 tablet 3   Cholecalciferol (VITAMIN D3) 25 MCG (1000 UT) CAPS Take 1,000 Units by mouth daily.     denosumab (PROLIA) 60 MG/ML SOSY injection Inject 60 mg into the skin every 6 (six) months.     levothyroxine (SYNTHROID) 75 MCG tablet TAKE 1 TABLET DAILY 90 tablet 1   nebivolol (BYSTOLIC) 2.5 MG tablet Take 1 tablet (2.5 mg total) by mouth daily. 90 tablet 3   Polyvinyl Alcohol-Povidone (REFRESH OP) Place 1 drop into both eyes in the morning and at bedtime.     Probiotic Product (PROBIOTIC-10 PO) Take 2 tablets by mouth daily.     No current facility-administered medications on file prior to visit.   Past Medical History:  Diagnosis Date   Age-related  osteoporosis without current pathological fracture    Arthritis    Atrophy of thyroid (acquired)    Benign hypertension 09/09/2020   Blepharitis 2021   Cholelithiases    Clostridium difficile infection    Diverticulitis    Diverticulitis of large intestine without perforation or abscess without bleeding    Family history of colon cancer    Migraine without aura, not intractable, without status migrainosus    Mixed hyperlipidemia    Stroke [facility])    Thyroid disease    UTI (urinary tract infection)    Vaginal prolapse    Vitamin D deficiency, unspecified    Past Surgical History:  Procedure Laterality Date   ABDOMINAL HYSTERECTOMY     APPENDECTOMY  1969   CATARACT EXTRACTION, BILATERAL Bilateral    right- 03/22/2018 and left 05/10/2018   COLONOSCOPY  05/12/2009   Colonic polyp status post polypectomy. Moderate predominanltly left colonic diverticulosis. Internal hemorrhoids   LOOP RECORDER INSERTION N/A 08/29/2020   Procedure: LOOP RECORDER INSERTION;  Surgeon: Vickie Epley, MD;  Location: [facility];  Service: Cardiovascular;  Laterality: N/A;   OOPHORECTOMY Left     Family History  Problem Relation Age of Onset   Breast cancer Mother    Colon cancer Father    Prostate cancer Brother    Coronary artery disease Other    Hypertension  Other    Esophageal cancer Neg Hx    Rectal cancer Neg Hx    Stomach cancer Neg Hx    Social History   Socioeconomic History   Marital status: Widowed    Spouse name: Not on file   Number of children: 3   Years of education: Not on file   Highest education level: Not on file  Occupational History   Not on file  Tobacco Use   Smoking status: Former   Smokeless tobacco: Never  Vaping Use   Vaping Use: Never used  Substance and Sexual Activity   Alcohol use: Yes    Comment: Drinks alcohol very infrequently. She typically consumes white wine.   Drug use: Never   Sexual activity: Not on file  Other Topics Concern   Not on  file  Social History Narrative   Not on file   Social Determinants of Health   Financial Resource Strain: Not on file  Food Insecurity: No Food Insecurity   Worried About Running Out of Food in the Last Year: Never true   Ran Out of Food in the Last Year: Never true  Transportation Needs: Unknown   Lack of Transportation (Medical): No   Lack of Transportation (Non-Medical): Not on file  Physical Activity: Not on file  Stress: Not on file  Social Connections: Not on file    Review of Systems  CONSTITUTIONAL: Negative for chills, fatigue, fever, unintentional weight gain and unintentional weight loss.  CARDIOVASCULAR: Negative for chest pain, dizziness, palpitations and pedal edema.  RESPIRATORY: Negative for recent cough and dyspnea.  GASTROINTESTINAL: Negative for abdominal pain, acid reflux symptoms, constipation, diarrhea, nausea and vomiting.    Objective:  BP 112/68   Pulse 66   Temp (!) 97.2 F (36.2 C)   Ht '5\' 3"'$  (1.6 m)   Wt 129 lb 6.4 oz (58.7 kg)   SpO2 95%   BMI 22.92 kg/m   BP/Weight 10/07/2020 [id number]  Systolic BP [id number]  Diastolic BP 68 88 93  Wt. (Lbs) 129.4 131 -  BMI 22.92 23.21 -    Physical Exam PHYSICAL EXAM:   VS: BP 112/68   Pulse 66   Temp (!) 97.2 F (36.2 C)   Ht '5\' 3"'$  (1.6 m)   Wt 129 lb 6.4 oz (58.7 kg)   SpO2 95%   BMI 22.92 kg/m   GEN: Well nourished, well developed, in no acute distress   Cardiac: RRR; no murmurs, rubs, or gallops,no edema - Respiratory:  normal respiratory rate and pattern with no distress - normal breath sounds with no rales, rhonchi, wheezes or rubs Skin: atypical skin lesion noted on scalp Psych: euthymic mood, appropriate affect and demeanor  Diabetic Foot Exam - Simple   No data filed      Lab Results  Component Value Date   WBC 6.9 09/09/2020   HGB 12.1 09/09/2020   HCT 36.9 09/09/2020   PLT 221 09/09/2020   GLUCOSE 89 09/09/2020   CHOL 173 08/28/2020   TRIG 85 08/28/2020    HDL 64 08/28/2020   LDLCALC 92 08/28/2020   ALT 19 09/09/2020   AST 21 09/09/2020   NA 140 09/09/2020   K 5.3 (H) 09/09/2020   CL 108 (H) 09/09/2020   CREATININE 1.55 (H) 09/09/2020   BUN 26 09/09/2020   CO2 19 (L) 09/09/2020   TSH 4.420 09/09/2020   INR 1.0 08/27/2020   HGBA1C 5.4 08/28/2020      Assessment & Plan:  1. Benign hypertension Continue current meds and follow up with cardiology as directed 2. Atypical squamoproliferative skin lesion    No orders of the defined types were placed in this encounter.   Orders Placed This Encounter  Procedures   Ambulatory referral to Dermatology      Follow-up: Return in about 3 months (around 01/07/2021) for chronic fasting.  An After Visit Summary was printed and given to the patient.  Yetta Flock [facility] [phone]

## 2020-10-09 ENCOUNTER — Other Ambulatory Visit: Payer: Self-pay

## 2020-10-09 ENCOUNTER — Ambulatory Visit (INDEPENDENT_AMBULATORY_CARE_PROVIDER_SITE_OTHER): Payer: Medicare Other | Admitting: Cardiology

## 2020-10-09 ENCOUNTER — Encounter: Payer: Self-pay | Admitting: Cardiology

## 2020-10-09 VITALS — BP 132/86 | HR 62 | Ht 63.0 in | Wt 129.1 lb

## 2020-10-09 DIAGNOSIS — R079 Chest pain, unspecified: Secondary | ICD-10-CM

## 2020-10-09 DIAGNOSIS — Z8673 Personal history of transient ischemic attack (TIA), and cerebral infarction without residual deficits: Secondary | ICD-10-CM

## 2020-10-09 DIAGNOSIS — I719 Aortic aneurysm of unspecified site, without rupture: Secondary | ICD-10-CM | POA: Diagnosis not present

## 2020-10-09 DIAGNOSIS — R0600 Dyspnea, unspecified: Secondary | ICD-10-CM | POA: Diagnosis not present

## 2020-10-09 DIAGNOSIS — R0609 Other forms of dyspnea: Secondary | ICD-10-CM

## 2020-10-09 MED ORDER — NITROGLYCERIN 0.4 MG SL SUBL: 0.4000 mg | SUBLINGUAL_TABLET | SUBLINGUAL | 3 refills | Status: DC | PRN

## 2020-10-09 NOTE — Patient Instructions (Signed)
Medication Instructions:  Your physician has recommended you make the following change in your medication:  START: Nitroglycerin 0.4 mg take one tablet by mouth every 5 minutes up to three times as needed for chest pain.  *If you need a refill on your cardiac medications before your next appointment, please call your pharmacy*   Lab Work: Your physician recommends that you return for lab work in:  BMET, Mag, CBC  If you have labs (blood work) drawn today and your tests are completely normal, you will receive your results only by: MyChart Message (if you have MyChart) OR A paper copy in the mail If you have any lab test that is abnormal or we need to change your treatment, we will call you to review the results.   Testing/Procedures:  [facility] [address], [place] [address] Dept: [phone] Loc: [phone]  [place]  10/09/2020  You are scheduled for a Cardiac Catheterization on Tuesday, August 23 with Dr. Larae Grooms.  1. Please arrive at the [facility] (Main Entrance A) at [facility]: [address] [place], [place] [address] at 7:00 AM (This time is two hours before your procedure to ensure your preparation). Free valet parking service is available.   Special note: Every effort is made to have your procedure done on time. Please understand that emergencies sometimes delay scheduled procedures.  2. Diet: Do not eat solid foods after midnight.  The patient may have clear liquids until 5am upon the day of the procedure.  3. Labs: You will need to have blood drawn on TODAY  4. Medication instructions in preparation for your procedure:   Contrast Allergy: No  On the morning of your procedure, take your Aspirin and any morning medicines NOT listed above.  You may use sips of water.  5. Plan for one night stay--bring personal belongings. 6. Bring a  current list of your medications and current insurance cards. 7. You MUST have a responsible person to drive you home. 8. Someone MUST be with you the first 24 hours after you arrive home or your discharge will be delayed. 9. Please wear clothes that are easy to get on and off and wear slip-on shoes.  Thank you for allowing [place] to care for you!   -- [facility]    Follow-Up: At [facility], you and your health needs are our priority.  As part of our continuing mission to provide you with exceptional heart care, we have created designated Provider Care Teams.  These Care Teams include your primary Cardiologist (physician) and Advanced Practice Providers (APPs -  Physician Assistants and Nurse Practitioners) who all work together to provide you with the care you need, when you need it.  We recommend signing up for the patient portal called "MyChart".  Sign up information is provided on this After Visit Summary.  MyChart is used to connect with patients for Virtual Visits (Telemedicine).  Patients are able to view lab/test results, encounter notes, upcoming appointments, etc.  Non-urgent messages can be sent to your provider as well.   To learn more about what you can do with MyChart, go to [email or web address].    Your next appointment:   4 week(s)  The format for your next appointment:   In Person  Provider:   [place], DO [address], [place], [place] [address]   Other Instructions

## 2020-10-09 NOTE — Progress Notes (Signed)
Cardiology Office Note:    Date:  10/09/2020   ID:  Ardeen Garland, DOB 1931-04-04, MRN [id number]  PCP:  Marge Duncans, [name]  Cardiologist:  Berniece Salines, DO  Electrophysiologist:  None   Referring MD: Marge Duncans, [name]    History of Present Illness:    Giuliette Chappelle is an 87 y.o. female with a hx of CVA and hyperlipidemia who was referred due to recent CVA concerning for embolic source and possible a-fib. Loop recorder was placed and did not detect any episodes of a-fib or other arrhythmia.   However, patient's main concerns today include dyspnea on exertion and episodic chest pain. Patient has had 4 episodes of chest pain over the past few months. First episode occurred while sitting in a meeting and the other 3 episodes woke her from sleep. She describes the pain as something heavy sitting on her chest. Usually last ~30 minutes. Her dyspnea on exertion has been present for the past few months and occurs when walking up a very slight incline. No palpitations, diaphoresis, dizziness/lightheadedness, syncope, or other symptoms. She is overall a very active and independent 87 year old woman.  Past Medical History:  Diagnosis Date   Age-related osteoporosis without current pathological fracture    Arthritis    Atrophy of thyroid (acquired)    Benign hypertension 09/09/2020   Blepharitis 2021   Cholelithiases    Clostridium difficile infection    Diverticulitis    Diverticulitis of large intestine without perforation or abscess without bleeding    Family history of colon cancer    Migraine without aura, not intractable, without status migrainosus    Mixed hyperlipidemia    Stroke [facility])    Thyroid disease    UTI (urinary tract infection)    Vaginal prolapse    Vitamin D deficiency, unspecified     Past Surgical History:  Procedure Laterality Date   ABDOMINAL HYSTERECTOMY     APPENDECTOMY  1969   CATARACT EXTRACTION, BILATERAL Bilateral    right- 03/22/2018 and left 05/10/2018    COLONOSCOPY  05/12/2009   Colonic polyp status post polypectomy. Moderate predominanltly left colonic diverticulosis. Internal hemorrhoids   LOOP RECORDER INSERTION N/A 08/29/2020   Procedure: LOOP RECORDER INSERTION;  Surgeon: Vickie Epley, MD;  Location: [place] [facility];  Service: Cardiovascular;  Laterality: N/A;   OOPHORECTOMY Left     Current Medications: Current Meds  Medication Sig   atorvastatin (LIPITOR) 40 MG tablet TAKE 1 TABLET(40 MG) BY MOUTH DAILY   bethanechol (URECHOLINE) 25 MG tablet Take 25 mg by mouth daily.   cetirizine (ZYRTEC) 10 MG tablet TAKE 1 TABLET DAILY   Cholecalciferol (VITAMIN D3) 25 MCG (1000 UT) CAPS Take 1,000 Units by mouth daily.   clopidogrel (PLAVIX) 75 MG tablet Take 75 mg by mouth daily.   denosumab (PROLIA) 60 MG/ML SOSY injection Inject 60 mg into the skin every 6 (six) months.   levothyroxine (SYNTHROID) 75 MCG tablet TAKE 1 TABLET DAILY   nebivolol (BYSTOLIC) 2.5 MG tablet Take 1 tablet (2.5 mg total) by mouth daily.   nitroGLYCERIN (NITROSTAT) 0.4 MG SL tablet Place 1 tablet (0.4 mg total) under the tongue every 5 (five) minutes as needed for chest pain.   Polyvinyl Alcohol-Povidone (REFRESH OP) Place 1 drop into both eyes in the morning and at bedtime.   Probiotic Product (PROBIOTIC-10 PO) Take 2 tablets by mouth daily.     Allergies:   Shellfish allergy, Avelox [moxifloxacin hcl in nacl], Cephalosporins, Levaquin [levofloxacin], Nitrofuran derivatives, Azithromycin, Moxifloxacin  hcl, and Phenobarbital   Social History   Socioeconomic History   Marital status: Widowed    Spouse name: Not on file   Number of children: 3   Years of education: Not on file   Highest education level: Not on file  Occupational History   Not on file  Tobacco Use   Smoking status: Former   Smokeless tobacco: Never  Vaping Use   Vaping Use: Never used  Substance and Sexual Activity   Alcohol use: Yes    Comment: Drinks alcohol very infrequently.  She typically consumes white wine.   Drug use: Never   Sexual activity: Not on file  Other Topics Concern   Not on file  Social History Narrative   Not on file   Social Determinants of Health   Financial Resource Strain: Not on file  Food Insecurity: No Food Insecurity   Worried About Running Out of Food in the Last Year: Never true   Ran Out of Food in the Last Year: Never true  Transportation Needs: Unknown   Lack of Transportation (Medical): No   Lack of Transportation (Non-Medical): Not on file  Physical Activity: Not on file  Stress: Not on file  Social Connections: Not on file     Family History: The patient's family history includes Breast cancer in her mother; Colon cancer in her father; Coronary artery disease in an other family member; Hypertension in an other family member; Prostate cancer in her brother. There is no history of Esophageal cancer, Rectal cancer, or Stomach cancer.  ROS:   Review of Systems  Constitution: Negative for decreased appetite, fever and weight gain.  HENT: Negative for congestion, ear discharge, hoarse voice and sore throat.   Eyes: Negative for discharge, redness, vision loss in right eye and visual halos.  Cardiovascular: Positive for chest pain and dyspnea on exertion. Negative for leg swelling, orthopnea and palpitations.  Respiratory: Negative for cough, hemoptysis, shortness of breath and snoring.   Endocrine: Negative for heat intolerance and polyphagia.  Hematologic/Lymphatic: Negative for bleeding problem. Does not bruise/bleed easily.  Skin: Negative for flushing, nail changes, rash and suspicious lesions.  Musculoskeletal: Negative for arthritis, joint pain, muscle cramps, myalgias, neck pain and stiffness.  Gastrointestinal: Negative for abdominal pain, bowel incontinence, diarrhea and excessive appetite.  Genitourinary: Negative for decreased libido, genital sores and incomplete emptying.  Neurological: Negative for brief  paralysis, focal weakness, headaches and loss of balance.  Psychiatric/Behavioral: Negative for altered mental status, depression and suicidal ideas.  Allergic/Immunologic: Negative for HIV exposure and persistent infections.    EKGs/Labs/Other Studies Reviewed:    The following studies were reviewed today:  Echo 08/28/2020: IMPRESSIONS    1. Left ventricular ejection fraction, by estimation, is 45 to 50%. The left ventricle has mildly decreased function. The left ventricle demonstrates global hypokinesis. Left ventricular diastolic parameters are consistent with Grade I diastolic  dysfunction (impaired relaxation).   2. Right ventricular systolic function is normal. The right ventricular size is normal. There is normal pulmonary artery systolic pressure. The estimated right ventricular systolic pressure is 0000000 mmHg.   3. The mitral valve is normal in structure. Trivial mitral valve  regurgitation. No evidence of mitral stenosis.   4. The aortic valve is tricuspid. Aortic valve regurgitation is mild. No aortic stenosis is present.   5. Aortic dilatation noted. There is mild dilatation of the ascending aorta, measuring 43 mm.   6. The inferior vena cava is normal in size with greater than 50% respiratory  variability, suggesting right atrial pressure of 3 mmHg.   EKG:  The ekg ordered today demonstrates normal sinus rhythm  Recent Labs: 09/09/2020: ALT 19; BUN 26; Creatinine, Ser 1.55; Hemoglobin 12.1; Platelets 221; Potassium 5.3; Sodium 140; TSH 4.420  Recent Lipid Panel    Component Value Date/Time   CHOL 173 08/28/2020 0351   CHOL 171 06/18/2020 0908   TRIG 85 08/28/2020 0351   HDL 64 08/28/2020 0351   HDL 72 06/18/2020 0908   CHOLHDL 2.7 08/28/2020 0351   VLDL 17 08/28/2020 0351   LDLCALC 92 08/28/2020 0351   LDLCALC 87 06/18/2020 0908    Physical Exam:    VS:  BP 132/86 (BP Location: Left Arm, Patient Position: Sitting, Cuff Size: Normal)   Pulse 62   Ht '5\' 3"'$  (1.6 m)    Wt 129 lb 1.3 oz (58.6 kg)   SpO2 96%   BMI 22.87 kg/m     Wt Readings from Last 3 Encounters:  10/09/20 129 lb 1.3 oz (58.6 kg)  10/07/20 129 lb 6.4 oz (58.7 kg)  09/09/20 131 lb (59.4 kg)     GEN: Well nourished, well developed in no acute distress HEENT: Normal NECK: No JVD; No carotid bruits LYMPHATICS: No lymphadenopathy CARDIAC: S1S2 noted,RRR, no murmurs, rubs, gallops RESPIRATORY:  Clear to auscultation without rales, wheezing or rhonchi  ABDOMEN: Soft, non-tender, non-distended, +bowel sounds, no guarding. EXTREMITIES: No edema, No cyanosis, no clubbing MUSCULOSKELETAL:  No deformity  SKIN: Warm and dry NEUROLOGIC:  Alert and oriented x 3, non-focal PSYCHIATRIC:  Normal affect, good insight  ASSESSMENT:    1. DOE (dyspnea on exertion)   2. History of CVA in adulthood   3. Chest pain of uncertain etiology   4. Aortic aneurysm without rupture, unspecified portion of aorta (HCC)    PLAN:    Patient with a few months of episodic chest pain, described as a heaviness that wakes her from sleep and also new dyspnea on exertion. As she has several cardiac risk factors (HTN, HLD, vascular disease), but is an overall independent and active 87 year old, we elected to pursue left heart catheterization. Also given Rx for nitroglycerin. Continue Atorvastatin, Plavix, and Nebivolol without changes.  She has mild dilatation of the ascending aorta (38m) noted on recent echo, so will need surveillance in the future (repeat in 1 year).  With regard to her recent CVA, there was no concern for a-fib on her loop recorder. No additional workup needed from that standpoint.  The patient is in agreement with the above plan. The patient left the office in stable condition.  The patient will follow up in 4 weeks   Medication Adjustments/Labs and Tests Ordered: Current medicines are reviewed at length with the patient today.  Concerns regarding medicines are outlined above.  Orders Placed  This Encounter  Procedures   Basic metabolic panel   Magnesium   CBC with Differential/Platelet   EKG 12-Lead   Meds ordered this encounter  Medications   nitroGLYCERIN (NITROSTAT) 0.4 MG SL tablet    Sig: Place 1 tablet (0.4 mg total) under the tongue every 5 (five) minutes as needed for chest pain.    Dispense:  90 tablet    Refill:  3    Patient Instructions  Medication Instructions:  Your physician has recommended you make the following change in your medication:  START: Nitroglycerin 0.4 mg take one tablet by mouth every 5 minutes up to three times as needed for chest pain.  *If you need a  refill on your cardiac medications before your next appointment, please call your pharmacy*   Lab Work: Your physician recommends that you return for lab work in:  BMET, Mag, CBC  If you have labs (blood work) drawn today and your tests are completely normal, you will receive your results only by: MyChart Message (if you have MyChart) OR A paper copy in the mail If you have any lab test that is abnormal or we need to change your treatment, we will call you to review the results.   Testing/Procedures:  [address], [place] [address] Dept: [phone] Loc: [phone]  Kathelyn Sada  10/09/2020  You are scheduled for a Cardiac Catheterization on Tuesday, August 23 with Dr. Larae Grooms.  1. Please arrive at the [facility] (Main Entrance A) at [facility]: [address] [place], [place] [address] at 7:00 AM (This time is two hours before your procedure to ensure your preparation). Free valet parking service is available.   Special note: Every effort is made to have your procedure done on time. Please understand that emergencies sometimes delay scheduled procedures.  2. Diet: Do not eat solid foods after midnight.  The patient may have clear liquids until  5am upon the day of the procedure.  3. Labs: You will need to have blood drawn on TODAY  4. Medication instructions in preparation for your procedure:   Contrast Allergy: No  On the morning of your procedure, take your Aspirin and any morning medicines NOT listed above.  You may use sips of water.  5. Plan for one night stay--bring personal belongings. 6. Bring a current list of your medications and current insurance cards. 7. You MUST have a responsible person to drive you home. 8. Someone MUST be with you the first 24 hours after you arrive home or your discharge will be delayed. 9. Please wear clothes that are easy to get on and off and wear slip-on shoes.  Thank you for allowing [place] to care for you!   -- [facility]    Follow-Up: At [facility], you and your health needs are our priority.  As part of our continuing mission to provide you with exceptional heart care, we have created designated Provider Care Teams.  These Care Teams include your primary Cardiologist (physician) and Advanced Practice Providers (APPs -  Physician Assistants and Nurse Practitioners) who all work together to provide you with the care you need, when you need it.  We recommend signing up for the patient portal called "MyChart".  Sign up information is provided on this After Visit Summary.  MyChart is used to connect with patients for Virtual Visits (Telemedicine).  Patients are able to view lab/test results, encounter notes, upcoming appointments, etc.  Non-urgent messages can be sent to your provider as well.   To learn more about what you can do with MyChart, go to [email or web address].    Your next appointment:   4 week(s)  The format for your next appointment:   In Person  Provider:   [place], DO [address], [place], [place] [address]   Other Instructions     Adopting a Healthy Lifestyle.  Know what a healthy weight is for  you (roughly BMI <25) and aim to maintain this   Aim for 7+ servings of fruits and vegetables daily   65-80+ fluid ounces of water  or unsweet tea for healthy kidneys   Limit to max 1 drink of alcohol per day; avoid smoking/tobacco   Limit animal fats in diet for cholesterol and heart health - choose grass fed whenever available   Avoid highly processed foods, and foods high in saturated/trans fats   Aim for low stress - take time to unwind and care for your mental health   Aim for 150 min of moderate intensity exercise weekly for heart health, and weights twice weekly for bone health   Aim for 7-9 hours of sleep daily   When it comes to diets, agreement about the perfect plan isnt easy to find, even among the experts. Experts at the [employer] developed an idea known as the Healthy Eating Plate. Just imagine a plate divided into logical, healthy portions.   The emphasis is on diet quality:   Load up on vegetables and fruits - one-half of your plate: Aim for color and variety, and remember that potatoes dont count.   Go for whole grains - one-quarter of your plate: Whole wheat, barley, wheat berries, quinoa, oats, brown rice, and foods made with them. If you want pasta, go with whole wheat pasta.   Protein power - one-quarter of your plate: Fish, chicken, beans, and nuts are all healthy, versatile protein sources. Limit red meat.   The diet, however, does go beyond the plate, offering a few other suggestions.   Use healthy plant oils, such as olive, canola, soy, corn, sunflower and peanut. Check the labels, and avoid partially hydrogenated oil, which have unhealthy trans fats.   If youre thirsty, drink water. Coffee and tea are good in moderation, but skip sugary drinks and limit milk and dairy products to one or two daily servings.   The type of carbohydrate in the diet is more important than the amount. Some sources of carbohydrates, such as vegetables,  fruits, whole grains, and beans-are healthier than others.   Finally, stay active  Signed, Alcus Dad, MD  10/09/2020 2:59 PM    [place]

## 2020-10-10 LAB — CBC WITH DIFFERENTIAL/PLATELET
Basophils Absolute: 0.1 10*3/uL (ref 0.0–0.2)
Basos: 2 %
EOS (ABSOLUTE): 0.3 10*3/uL (ref 0.0–0.4)
Eos: 4 %
Hematocrit: 38.7 % (ref 34.0–46.6)
Hemoglobin: 12.8 g/dL (ref 11.1–15.9)
Immature Grans (Abs): 0 10*3/uL (ref 0.0–0.1)
Immature Granulocytes: 1 %
Lymphocytes Absolute: 2.3 10*3/uL (ref 0.7–3.1)
Lymphs: 34 %
MCH: 30.3 pg (ref 26.6–33.0)
MCHC: 33.1 g/dL (ref 31.5–35.7)
MCV: 92 fL (ref 79–97)
Monocytes Absolute: 0.5 10*3/uL (ref 0.1–0.9)
Monocytes: 8 %
Neutrophils Absolute: 3.5 10*3/uL (ref 1.4–7.0)
Neutrophils: 51 %
Platelets: 234 10*3/uL (ref 150–450)
RBC: 4.22 x10E6/uL (ref 3.77–5.28)
RDW: 12.6 % (ref 11.7–15.4)
WBC: 6.7 10*3/uL (ref 3.4–10.8)

## 2020-10-10 LAB — BASIC METABOLIC PANEL
BUN/Creatinine Ratio: 20 (ref 12–28)
BUN: 35 mg/dL — ABNORMAL HIGH (ref 8–27)
CO2: 18 mmol/L — ABNORMAL LOW (ref 20–29)
Calcium: 9.5 mg/dL (ref 8.7–10.3)
Chloride: 105 mmol/L (ref 96–106)
Creatinine, Ser: 1.74 mg/dL — ABNORMAL HIGH (ref 0.57–1.00)
Glucose: 96 mg/dL (ref 65–99)
Potassium: 5.8 mmol/L — ABNORMAL HIGH (ref 3.5–5.2)
Sodium: 138 mmol/L (ref 134–144)
eGFR: 28 mL/min/{1.73_m2} — ABNORMAL LOW (ref >59)

## 2020-10-10 LAB — MAGNESIUM: Magnesium: 2.5 mg/dL — ABNORMAL HIGH (ref 1.6–2.3)

## 2020-10-13 ENCOUNTER — Telehealth: Payer: Self-pay | Admitting: *Deleted

## 2020-10-13 NOTE — Telephone Encounter (Signed)
HP Office unable to locate EKG done 10/09/20, will need EKG at hospital tomorrow morning.

## 2020-10-13 NOTE — Telephone Encounter (Addendum)
Cardiac catheterization scheduled at [facility] for: Tuesday October 14, 2020 [facility] Main Entrance A [facility]) at: 7 AM-pre-procedure hydration   No solid food after midnight prior to cath, clear liquids until 5 AM day of procedure.  CONTRAST ALLERGY: no-pt reports food allergy to shellfish.  Morning medications can be taken pre-cath with sips of water including; - aspirin 81 mg-pt reports she can take and tolerate aspirin -Plavix 75 mg    Confirmed patient has responsible adult to drive home post procedure and be with patient first 24 hours after arriving home.  Patients are allowed one visitor in the waiting room during the time they are at the hospital for their procedure. Both patient and visitor must wear a mask once they enter the hospital.   Patient reports does not currently have any symptoms concerning for COVID-19 and no household members with COVID-19 like illness.      Reviewed procedure/mask/visitor instructions with patient, discussed pre-procedure hydration with patient.                           See 10/09/20 BMP results-discussed avoiding foods high in potassium with patient.

## 2020-10-14 ENCOUNTER — Ambulatory Visit (HOSPITAL_COMMUNITY)
Admission: RE | Admit: 2020-10-14 | Discharge: 2020-10-14 | Disposition: A | Discharge status: Home / Self Care | Payer: Medicare Other | Attending: Interventional Cardiology | Admitting: Interventional Cardiology

## 2020-10-14 ENCOUNTER — Other Ambulatory Visit: Payer: Self-pay

## 2020-10-14 ENCOUNTER — Encounter (HOSPITAL_COMMUNITY)
Admission: RE | Disposition: A | Discharge status: Home / Self Care | Payer: Self-pay | Source: Home / Self Care | Attending: Interventional Cardiology

## 2020-10-14 DIAGNOSIS — E785 Hyperlipidemia, unspecified: Secondary | ICD-10-CM | POA: Insufficient documentation

## 2020-10-14 DIAGNOSIS — I719 Aortic aneurysm of unspecified site, without rupture: Secondary | ICD-10-CM | POA: Diagnosis not present

## 2020-10-14 DIAGNOSIS — Z79899 Other long term (current) drug therapy: Secondary | ICD-10-CM | POA: Insufficient documentation

## 2020-10-14 DIAGNOSIS — Z881 Allergy status to other antibiotic agents status: Secondary | ICD-10-CM | POA: Insufficient documentation

## 2020-10-14 DIAGNOSIS — R0609 Other forms of dyspnea: Secondary | ICD-10-CM | POA: Diagnosis not present

## 2020-10-14 DIAGNOSIS — N289 Disorder of kidney and ureter, unspecified: Secondary | ICD-10-CM | POA: Insufficient documentation

## 2020-10-14 DIAGNOSIS — Z7902 Long term (current) use of antithrombotics/antiplatelets: Secondary | ICD-10-CM | POA: Diagnosis not present

## 2020-10-14 DIAGNOSIS — Z91013 Allergy to seafood: Secondary | ICD-10-CM | POA: Diagnosis not present

## 2020-10-14 DIAGNOSIS — Z888 Allergy status to other drugs, medicaments and biological substances status: Secondary | ICD-10-CM | POA: Insufficient documentation

## 2020-10-14 DIAGNOSIS — Z87891 Personal history of nicotine dependence: Secondary | ICD-10-CM | POA: Insufficient documentation

## 2020-10-14 DIAGNOSIS — Z79890 Hormone replacement therapy (postmenopausal): Secondary | ICD-10-CM | POA: Insufficient documentation

## 2020-10-14 DIAGNOSIS — I2510 Atherosclerotic heart disease of native coronary artery without angina pectoris: Secondary | ICD-10-CM | POA: Diagnosis not present

## 2020-10-14 DIAGNOSIS — R0600 Dyspnea, unspecified: Secondary | ICD-10-CM

## 2020-10-14 DIAGNOSIS — Z8673 Personal history of transient ischemic attack (TIA), and cerebral infarction without residual deficits: Secondary | ICD-10-CM | POA: Diagnosis not present

## 2020-10-14 DIAGNOSIS — R079 Chest pain, unspecified: Secondary | ICD-10-CM | POA: Insufficient documentation

## 2020-10-14 HISTORY — PX: LEFT HEART CATH AND CORONARY ANGIOGRAPHY: CATH118249

## 2020-10-14 LAB — BASIC METABOLIC PANEL
Anion gap: 5 (ref 5–15)
BUN: 41 mg/dL — ABNORMAL HIGH (ref 8–23)
CO2: 21 mmol/L — ABNORMAL LOW (ref 22–32)
Calcium: 9.1 mg/dL (ref 8.9–10.3)
Chloride: 112 mmol/L — ABNORMAL HIGH (ref 98–111)
Creatinine, Ser: 1.91 mg/dL — ABNORMAL HIGH (ref 0.44–1.00)
GFR, Estimated: 25 mL/min — ABNORMAL LOW (ref >60)
Glucose, Bld: 95 mg/dL (ref 70–99)
Potassium: 4.2 mmol/L (ref 3.5–5.1)
Sodium: 138 mmol/L (ref 135–145)

## 2020-10-14 SURGERY — LEFT HEART CATH AND CORONARY ANGIOGRAPHY
Anesthesia: LOCAL

## 2020-10-14 MED ORDER — HYDRALAZINE HCL 20 MG/ML IJ SOLN: 10.0000 mg | INTRAMUSCULAR | Status: DC | PRN

## 2020-10-14 MED ORDER — HEPARIN (PORCINE) IN NACL 2000-0.9 UNIT/L-% IV SOLN
INTRAVENOUS | Status: AC
  Filled 2020-10-14: qty 1000

## 2020-10-14 MED ORDER — HEPARIN (PORCINE) IN NACL 1000-0.9 UT/500ML-% IV SOLN
INTRAVENOUS | Status: AC
  Filled 2020-10-14: qty 500

## 2020-10-14 MED ORDER — CLOPIDOGREL BISULFATE 75 MG PO TABS: 75.0000 mg | ORAL_TABLET | ORAL | Status: DC

## 2020-10-14 MED ORDER — MIDAZOLAM HCL 2 MG/2ML IJ SOLN
INTRAMUSCULAR | Status: DC | PRN
  Administered 2020-10-14: 1 mg via INTRAVENOUS

## 2020-10-14 MED ORDER — SODIUM CHLORIDE 0.9% FLUSH: 3.0000 mL | INTRAVENOUS | Status: DC | PRN

## 2020-10-14 MED ORDER — SODIUM CHLORIDE 0.9 % WEIGHT BASED INFUSION
3.0000 mL/kg/h | INTRAVENOUS | Status: AC
  Administered 2020-10-14: 3 mL/kg/h via INTRAVENOUS

## 2020-10-14 MED ORDER — SODIUM CHLORIDE 0.9% FLUSH: 3.0000 mL | Freq: Two times a day (BID) | INTRAVENOUS | Status: DC

## 2020-10-14 MED ORDER — FENTANYL CITRATE (PF) 100 MCG/2ML IJ SOLN
INTRAMUSCULAR | Status: AC
  Filled 2020-10-14: qty 2

## 2020-10-14 MED ORDER — ONDANSETRON HCL 4 MG/2ML IJ SOLN: 4.0000 mg | Freq: Four times a day (QID) | INTRAMUSCULAR | Status: DC | PRN

## 2020-10-14 MED ORDER — SODIUM CHLORIDE 0.9 % WEIGHT BASED INFUSION: 1.0000 mL/kg/h | INTRAVENOUS | Status: DC

## 2020-10-14 MED ORDER — HEPARIN SODIUM (PORCINE) 1000 UNIT/ML IJ SOLN
INTRAMUSCULAR | Status: AC
  Filled 2020-10-14: qty 1

## 2020-10-14 MED ORDER — ASPIRIN 81 MG PO CHEW: 81.0000 mg | CHEWABLE_TABLET | ORAL | Status: DC

## 2020-10-14 MED ORDER — MIDAZOLAM HCL 2 MG/2ML IJ SOLN
INTRAMUSCULAR | Status: AC
  Filled 2020-10-14: qty 2

## 2020-10-14 MED ORDER — VERAPAMIL HCL 2.5 MG/ML IV SOLN
INTRAVENOUS | Status: AC
  Filled 2020-10-14: qty 2

## 2020-10-14 MED ORDER — IOHEXOL 350 MG/ML SOLN
INTRAVENOUS | Status: DC | PRN
  Administered 2020-10-14: 20 mL

## 2020-10-14 MED ORDER — ACETAMINOPHEN 325 MG PO TABS: 650.0000 mg | ORAL_TABLET | ORAL | Status: DC | PRN

## 2020-10-14 MED ORDER — SODIUM CHLORIDE 0.9 % IV SOLN: 250.0000 mL | INTRAVENOUS | Status: DC | PRN

## 2020-10-14 MED ORDER — SODIUM CHLORIDE 0.9 % IV SOLN: INTRAVENOUS | Status: AC

## 2020-10-14 MED ORDER — LABETALOL HCL 5 MG/ML IV SOLN: 10.0000 mg | INTRAVENOUS | Status: DC | PRN

## 2020-10-14 MED ORDER — VERAPAMIL HCL 2.5 MG/ML IV SOLN
INTRAVENOUS | Status: DC | PRN
  Administered 2020-10-14: 10 mL via INTRA_ARTERIAL

## 2020-10-14 MED ORDER — HEPARIN SODIUM (PORCINE) 1000 UNIT/ML IJ SOLN
INTRAMUSCULAR | Status: DC | PRN
  Administered 2020-10-14: 3000 [IU] via INTRAVENOUS

## 2020-10-14 MED ORDER — LIDOCAINE HCL (PF) 1 % IJ SOLN
INTRAMUSCULAR | Status: DC | PRN
  Administered 2020-10-14: 2 mL

## 2020-10-14 MED ORDER — HEPARIN (PORCINE) IN NACL 1000-0.9 UT/500ML-% IV SOLN
INTRAVENOUS | Status: DC | PRN
  Administered 2020-10-14 (×2): 500 mL

## 2020-10-14 MED ORDER — FENTANYL CITRATE (PF) 100 MCG/2ML IJ SOLN
INTRAMUSCULAR | Status: DC | PRN
  Administered 2020-10-14: 25 ug via INTRAVENOUS

## 2020-10-14 MED ORDER — LIDOCAINE HCL (PF) 1 % IJ SOLN
INTRAMUSCULAR | Status: AC
  Filled 2020-10-14: qty 30

## 2020-10-14 SURGICAL SUPPLY — 10 items
CATH 5FR JL3.5 JR4 ANG PIG MP (CATHETERS) ×2 IMPLANT
DEVICE RAD COMP TR BAND LRG (VASCULAR PRODUCTS) ×2 IMPLANT
GLIDESHEATH SLEND SS 6F .021 (SHEATH) ×2 IMPLANT
GUIDEWIRE INQWIRE 1.5J.035X260 (WIRE) ×1 IMPLANT
INQWIRE 1.5J .035X260CM (WIRE) ×2
KIT HEART LEFT (KITS) ×2 IMPLANT
PACK CARDIAC CATHETERIZATION (CUSTOM PROCEDURE TRAY) ×2 IMPLANT
SHEATH PROBE COVER 6X72 (BAG) ×2 IMPLANT
TRANSDUCER W/STOPCOCK (MISCELLANEOUS) ×2 IMPLANT
TUBING CIL FLEX 10 FLL-RA (TUBING) ×2 IMPLANT

## 2020-10-14 NOTE — Progress Notes (Signed)
Hematoma noted to right arm. Pressure held for 77mns. Area with bruising noted

## 2020-10-14 NOTE — Interval H&P Note (Signed)
Cath Lab Visit (complete for each Cath Lab visit)  Clinical Evaluation Leading to the Procedure:   ACS: No.  Non-ACS:    Anginal Classification: CCS III  Anti-ischemic medical therapy: Minimal Therapy (1 class of medications)  Non-Invasive Test Results: No non-invasive testing performed  Prior CABG: No previous CABG      History and Physical Interval Note:  10/14/2020 12:33 PM  Tamesia Yeargin  has presented today for surgery, with the diagnosis of chest pain.  The various methods of treatment have been discussed with the patient and family. After consideration of risks, benefits and other options for treatment, the patient has consented to  Procedure(s): LEFT HEART CATH AND CORONARY ANGIOGRAPHY (N/A) as a surgical intervention.  The patient's history has been reviewed, patient examined, no change in status, stable for surgery.  I have reviewed the patient's chart and labs.  Questions were answered to the patient's satisfaction.     Larae Grooms

## 2020-10-14 NOTE — Discharge Instructions (Signed)
Radial Site Care  This sheet gives you information about how to care for yourself after your procedure. Your health care provider may also give you more specific instructions. If you have problems or questions, contact your health care provider. What can I expect after the procedure? After the procedure, it is common to have: Bruising and tenderness at the catheter insertion area. Follow these instructions at home: Medicines Take over-the-counter and prescription medicines only as told by your health care provider. Insertion site care Follow instructions from your health care provider about how to take care of your insertion site. Make sure you: Wash your hands with soap and water before you remove your bandage (dressing). If soap and water are not available, use hand sanitizer. May remove dressing in 24 hours. Check your insertion site every day for signs of infection. Check for: Redness, swelling, or pain. Fluid or blood. Pus or a bad smell. Warmth. Do no take baths, swim, or use a hot tub for 5 days. You may shower 24-48 hours after the procedure. Remove the dressing and gently wash the site with plain soap and water. Pat the area dry with a clean towel. Do not rub the site. That could cause bleeding. Do not apply powder or lotion to the site. Activity  For 24 hours after the procedure, or as directed by your health care provider: Do not flex or bend the affected arm. Do not push or pull heavy objects with the affected arm. Do not drive yourself home from the hospital or clinic. You may drive 24 hours after the procedure. Do not operate machinery or power tools. KEEP ARM ELEVATED THE REMAINDER OF THE DAY. Do not push, pull or lift anything that is heavier than 10 lb for 5 days. Ask your health care provider when it is okay to: Return to work or school. Resume usual physical activities or sports. Resume sexual activity. General instructions If the catheter site starts to  bleed, raise your arm and put firm pressure on the site. If the bleeding does not stop, get help right away. This is a medical emergency. DRINK PLENTY OF FLUIDS FOR THE NEXT 2-3 DAYS. No alcohol consumption for 24 hours after receiving sedation. If you went home on the same day as your procedure, a responsible adult should be with you for the first 24 hours after you arrive home. Keep all follow-up visits as told by your health care provider. This is important. Contact a health care provider if: You have a fever. You have redness, swelling, or yellow drainage around your insertion site. Get help right away if: You have unusual pain at the radial site. The catheter insertion area swells very fast. The insertion area is bleeding, and the bleeding does not stop when you hold steady pressure on the area. Your arm or hand becomes pale, cool, tingly, or numb. These symptoms may represent a serious problem that is an emergency. Do not wait to see if the symptoms will go away. Get medical help right away. Call your local emergency services (911 in the [place] Do not drive yourself to the hospital. Summary After the procedure, it is common to have bruising and tenderness at the site. Follow instructions from your health care provider about how to take care of your radial site wound. Check the wound every day for signs of infection.  This information is not intended to replace advice given to you by your health care provider. Make sure you discuss any questions you have with   your health care provider. Document Revised: 03/16/2017 Document Reviewed: 03/16/2017 Elsevier Patient Education  2020 [employer]  

## 2020-10-15 ENCOUNTER — Encounter (HOSPITAL_COMMUNITY): Payer: Self-pay | Admitting: Interventional Cardiology

## 2020-10-24 NOTE — Progress Notes (Signed)
Carelink Summary Report / Loop Recorder 

## 2020-10-28 ENCOUNTER — Other Ambulatory Visit: Payer: Self-pay | Admitting: *Deleted

## 2020-10-28 NOTE — Patient Outreach (Signed)
[facility]) Care Management  10/28/2020  Claudia Rasmussen 1931/12/05 [id number]   Outgoing call placed to member, state she is doing better.  Boot is completely off, able to walk and drive without issues.  Will follow up with PCP on 11/17.  Denies any urgent concerns, encouraged to contact this care manager with questions.  Agrees to follow up within the next month.   Goals Addressed             This Visit's Progress    THN - Keep or Improve My Strength-Stroke   On track    Timeframe:  Long-Range Goal Priority:  Medium Start Date:           7/20                  Expected End Date:    10/20                   Barriers: Knowledge    - eat healthy to increase strength - know who to call for help if I fall - plan activity for times when energy is the highest    Why is this important?   Before the stroke you probably did not think much about being safe when you are up and about.  Now, it may be harder for you to get around.  It may also be easier for you to trip or fall.  It is common to have muscle weakness after a stroke. You may also feel like you cannot control an arm or leg.  It will be helpful to work with a physical therapist to get your strength and muscle control back.  It is good to stay as active as you can. Walking and stretching help you stay strong and flexible.  The physical therapist will develop an exercise program just for you.     Notes:   7/20 - Discussed interventions to improve strength, such as exercises and adequate sleep.  Will follow up with ortho regarding removal of ortho boot and potential need for PT  8/4 - Member has increased strength and independence, now able to wear a shoe a few times a week, able to bear weight on foot.    Will follow up with PCP on 8/16 and cardiology on 8/18  9/6 - Loop recorder was placed to monitor for risk for recurrent stroke.  Also had cardiac cath last month on 8/23, no interventions.  Will follow up  with cardiology on 10/5.      THN - Track and Manage My Blood Pressure-Hypertension   On track    Timeframe:  Short-Term Goal Priority:  High Start Date:        7/20                     Expected End Date:       10/20 - goal extended  Barriers: Knowledge Other - lack of equipment                  - check blood pressure 3 times per week - write blood pressure results in a log or diary    Why is this important?   You won't feel high blood pressure, but it can still hurt your blood vessels.  High blood pressure can cause heart or kidney problems. It can also cause a stroke.  Making lifestyle changes like losing a little weight or eating less salt will help.  Checking your  blood pressure at home and at different times of the day can help to control blood pressure.  If the doctor prescribes medicine remember to take it the way the doctor ordered.  Call the office if you cannot afford the medicine or if there are questions about it.     Notes:   7/20 - Discussed importance of monitoring BP often with start of new medication.  Advised to have clinic at [facility] check and document in log.  BP log sent to member.  8/4 - Report she has borrowed a blood pressure monitor to self monitor, will record in log daily.  Advised that management of BP will help with decreasing risk of recurrent stroke  9/6 - Blood pressure today was 134/81, denies any extreme hypertensive episodes.  Continues to record to share with physicians

## 2020-11-05 DIAGNOSIS — Z23 Encounter for immunization: Secondary | ICD-10-CM | POA: Diagnosis not present

## 2020-11-10 ENCOUNTER — Ambulatory Visit (INDEPENDENT_AMBULATORY_CARE_PROVIDER_SITE_OTHER): Payer: Medicare Other

## 2020-11-10 DIAGNOSIS — N1832 Chronic kidney disease, stage 3b: Secondary | ICD-10-CM | POA: Diagnosis not present

## 2020-11-10 DIAGNOSIS — Z8673 Personal history of transient ischemic attack (TIA), and cerebral infarction without residual deficits: Secondary | ICD-10-CM | POA: Diagnosis not present

## 2020-11-11 LAB — CUP PACEART REMOTE DEVICE CHECK
Date Time Interrogation Session: 20220915183026
Implantable Pulse Generator Implant Date: 20220708

## 2020-11-12 DIAGNOSIS — N183 Chronic kidney disease, stage 3 unspecified: Secondary | ICD-10-CM | POA: Diagnosis not present

## 2020-11-12 DIAGNOSIS — I129 Hypertensive chronic kidney disease with stage 1 through stage 4 chronic kidney disease, or unspecified chronic kidney disease: Secondary | ICD-10-CM | POA: Diagnosis not present

## 2020-11-12 DIAGNOSIS — N179 Acute kidney failure, unspecified: Secondary | ICD-10-CM | POA: Diagnosis not present

## 2020-11-12 DIAGNOSIS — N1832 Chronic kidney disease, stage 3b: Secondary | ICD-10-CM | POA: Diagnosis not present

## 2020-11-17 NOTE — Progress Notes (Signed)
Carelink Summary Report / Loop Recorder 

## 2020-11-24 ENCOUNTER — Other Ambulatory Visit: Payer: Self-pay | Admitting: *Deleted

## 2020-11-24 NOTE — Patient Outreach (Signed)
[place] [facility]) Care Management  [place]  11/24/2020   Claudia Rasmussen April 07, 1931 [id number]   Outgoing call placed to member, state she is better.  Denies any urgent concerns, encouraged to contact this care manager with questions.    Encounter Medications:  Outpatient Encounter Medications as of 11/24/2020  Medication Sig Note   atorvastatin (LIPITOR) 40 MG tablet TAKE 1 TABLET(40 MG) BY MOUTH DAILY    bethanechol (URECHOLINE) 25 MG tablet Take 25 mg by mouth in the morning.    cetirizine (ZYRTEC) 10 MG tablet TAKE 1 TABLET DAILY (Patient taking differently: Take 10 mg by mouth at bedtime.)    Cholecalciferol (VITAMIN D3) 25 MCG (1000 UT) CAPS Take 1,000 Units by mouth in the morning.    clopidogrel (PLAVIX) 75 MG tablet Take 75 mg by mouth in the morning.    denosumab (PROLIA) 60 MG/ML SOSY injection Inject 60 mg into the skin every 6 (six) months.    levothyroxine (SYNTHROID) 75 MCG tablet TAKE 1 TABLET DAILY    nebivolol (BYSTOLIC) 2.5 MG tablet Take 1 tablet (2.5 mg total) by mouth daily.    nitroGLYCERIN (NITROSTAT) 0.4 MG SL tablet Place 1 tablet (0.4 mg total) under the tongue every 5 (five) minutes as needed for chest pain. 10/14/2020: Never used as of 10/14/20.   Polyvinyl Alcohol-Povidone (REFRESH OP) Place 1 drop into both eyes in the morning and at bedtime.    Probiotic Product (PROBIOTIC-10 PO) Take 1 capsule by mouth daily.    No facility-administered encounter medications on file as of 11/24/2020.    Functional Status:  In your present state of health, do you have any difficulty performing the following activities: 08/28/2020 08/28/2020  Hearing? - Y  Vision? - N  Difficulty concentrating or making decisions? - N  Walking or climbing stairs? - N  Dressing or bathing? - N  Doing errands, shopping? N -  Some recent data might be hidden    Fall/Depression Screening: Fall Risk  10/07/2020 09/10/2020 12/19/2019  Falls in the past year? 1 1 0   Comment - - -  Number falls in past yr: 0 0 0  Comment - - -  Injury with Fall? 1 1 0  Risk for fall due to : - History of fall(s) No Fall Risks  Follow up - Falls evaluation completed;Falls prevention discussed -   PHQ 2/9 Scores 10/07/2020 09/10/2020 12/19/2019  PHQ - 2 Score 0 1 0  Exception Documentation - (No Data) -    Assessment:   Care Plan Care Plan : Stroke (Adult)  Updates made by Valente David, RN since 11/24/2020 12:00 AM     Problem: Recurrence (Stroke)      Long-Range Goal: Stroke Recurrence Prevented or Minimized evidenced by no hospitalizations   Start Date: 09/10/2020  Expected End Date: 12/10/2020  This Visit's Progress: On track  Recent Progress: On track  Priority: High  Note:   7/20 - Established baseline by comparing pre and poststroke level of function using age-appropriate criteria; consider history of transient ischemia attack.   Promoted long-term management of risk factors, such as tobacco use, sedentary lifestyle, obesity, sleep disordered breathing, unhealthy diet and high alcohol consumption.       Goals Addressed             This Visit's Progress    THN - Keep or Improve My Strength-Stroke   On track    Timeframe:  Long-Range Goal Priority:  Medium Start Date:  7/20                  Expected End Date:    10/20                   Barriers: Knowledge    - eat healthy to increase strength - know who to call for help if I fall - plan activity for times when energy is the highest    Why is this important?   Before the stroke you probably did not think much about being safe when you are up and about.  Now, it may be harder for you to get around.  It may also be easier for you to trip or fall.  It is common to have muscle weakness after a stroke. You may also feel like you cannot control an arm or leg.  It will be helpful to work with a physical therapist to get your strength and muscle control back.  It is good to stay as  active as you can. Walking and stretching help you stay strong and flexible.  The physical therapist will develop an exercise program just for you.     Notes:   7/20 - Discussed interventions to improve strength, such as exercises and adequate sleep.  Will follow up with ortho regarding removal of ortho boot and potential need for PT  8/4 - Member has increased strength and independence, now able to wear a shoe a few times a week, able to bear weight on foot.    Will follow up with PCP on 8/16 and cardiology on 8/18  9/6 - Loop recorder was placed to monitor for risk for recurrent stroke.  Also had cardiac cath last month on 8/23, no interventions.  Will follow up with cardiology on 10/5.   10/3 - Report she has continued to increase strength, state she is feeling better than she has over the last few months     THN - Track and Manage My Blood Pressure-Hypertension   On track    Timeframe:  Short-Term Goal Priority:  High Start Date:        7/20                     Expected End Date:       10/20 - goal extended  Barriers: Knowledge Other - lack of equipment                  - check blood pressure 3 times per week - write blood pressure results in a log or diary    Why is this important?   You won't feel high blood pressure, but it can still hurt your blood vessels.  High blood pressure can cause heart or kidney problems. It can also cause a stroke.  Making lifestyle changes like losing a little weight or eating less salt will help.  Checking your blood pressure at home and at different times of the day can help to control blood pressure.  If the doctor prescribes medicine remember to take it the way the doctor ordered.  Call the office if you cannot afford the medicine or if there are questions about it.     Notes:   7/20 - Discussed importance of monitoring BP often with start of new medication.  Advised to have clinic at [facility] check and document in log.  BP log sent to  member.  8/4 - Report she has borrowed a blood pressure monitor  to self monitor, will record in log daily.  Advised that management of BP will help with decreasing risk of recurrent stroke  9/6 - Blood pressure today was 134/81, denies any extreme hypertensive episodes.  Continues to record to share with physicians  10/3 - Report BP has remained stable, monitoring daily and recording to share trends with MD        Plan:  Follow-up: Patient agrees to Care Plan and Follow-up. Follow-up in 1 month(s).    Valente David, [place], MSN [place] [phone]

## 2020-11-26 ENCOUNTER — Ambulatory Visit (INDEPENDENT_AMBULATORY_CARE_PROVIDER_SITE_OTHER): Payer: Medicare Other | Admitting: Cardiology

## 2020-11-26 ENCOUNTER — Encounter: Payer: Self-pay | Admitting: Cardiology

## 2020-11-26 ENCOUNTER — Other Ambulatory Visit: Payer: Self-pay

## 2020-11-26 VITALS — BP 140/80 | HR 70 | Ht 63.0 in | Wt 132.2 lb

## 2020-11-26 DIAGNOSIS — I2510 Atherosclerotic heart disease of native coronary artery without angina pectoris: Secondary | ICD-10-CM | POA: Diagnosis not present

## 2020-11-26 DIAGNOSIS — I10 Essential (primary) hypertension: Secondary | ICD-10-CM

## 2020-11-26 DIAGNOSIS — I519 Heart disease, unspecified: Secondary | ICD-10-CM

## 2020-11-26 DIAGNOSIS — E782 Mixed hyperlipidemia: Secondary | ICD-10-CM | POA: Diagnosis not present

## 2020-11-26 NOTE — Progress Notes (Signed)
Cardiology Office Note:    Date:  11/26/2020   ID:  Eduardo Wurth, DOB 08/04/1931, MRN [id number]  PCP:  Marge Duncans, [name]  Cardiologist:  Berniece Salines, DO  Electrophysiologist:  None   Referring MD: Marge Duncans, [name]   " I am doing well"  History of Present Illness:    Claudia Rasmussen is a 87 y.o. female with a hx of CAD recently seen on LHC, HTN is here today for a follow up visit. The patient is with her daughter. At her initial visit she was experiencing chest pain. Given her risk factors I send her for a LHC. She had the cath showed non-obstructive CAD.'  Today she offers no complaints.   Past Medical History:  Diagnosis Date   Age-related osteoporosis without current pathological fracture    Arthritis    Atrophy of thyroid (acquired)    Benign hypertension 09/09/2020   Blepharitis 2021   Cholelithiases    Clostridium difficile infection    Diverticulitis    Diverticulitis of large intestine without perforation or abscess without bleeding    Family history of colon cancer    Migraine without aura, not intractable, without status migrainosus    Mixed hyperlipidemia    Stroke [facility])    Thyroid disease    UTI (urinary tract infection)    Vaginal prolapse    Vitamin D deficiency, unspecified     Past Surgical History:  Procedure Laterality Date   ABDOMINAL HYSTERECTOMY     APPENDECTOMY  1969   CATARACT EXTRACTION, BILATERAL Bilateral    right- 03/22/2018 and left 05/10/2018   COLONOSCOPY  05/12/2009   Colonic polyp status post polypectomy. Moderate predominanltly left colonic diverticulosis. Internal hemorrhoids   LEFT HEART CATH AND CORONARY ANGIOGRAPHY N/A 10/14/2020   Procedure: LEFT HEART CATH AND CORONARY ANGIOGRAPHY;  Surgeon: Jettie Booze, MD;  Location: [place] [facility];  Service: Cardiovascular;  Laterality: N/A;   LOOP RECORDER INSERTION N/A 08/29/2020   Procedure: LOOP RECORDER INSERTION;  Surgeon: Vickie Epley, MD;  Location: [place] [facility];  Service: Cardiovascular;  Laterality: N/A;   OOPHORECTOMY Left     Current Medications: Current Meds  Medication Sig   atorvastatin (LIPITOR) 40 MG tablet TAKE 1 TABLET(40 MG) BY MOUTH DAILY   bethanechol (URECHOLINE) 25 MG tablet Take 25 mg by mouth in the morning.   cetirizine (ZYRTEC) 10 MG tablet TAKE 1 TABLET DAILY (Patient taking differently: Take 10 mg by mouth at bedtime.)   Cholecalciferol (VITAMIN D3) 25 MCG (1000 UT) CAPS Take 1,000 Units by mouth in the morning.   clopidogrel (PLAVIX) 75 MG tablet Take 75 mg by mouth in the morning.   denosumab (PROLIA) 60 MG/ML SOSY injection Inject 60 mg into the skin every 6 (six) months.   levothyroxine (SYNTHROID) 75 MCG tablet TAKE 1 TABLET DAILY   nebivolol (BYSTOLIC) 2.5 MG tablet Take 1 tablet (2.5 mg total) by mouth daily.   nitroGLYCERIN (NITROSTAT) 0.4 MG SL tablet Place 1 tablet (0.4 mg total) under the tongue every 5 (five) minutes as needed for chest pain.   Polyvinyl Alcohol-Povidone (REFRESH OP) Place 1 drop into both eyes in the morning and at bedtime.   Probiotic Product (PROBIOTIC-10 PO) Take 1 capsule by mouth daily.     Allergies:   Shellfish allergy, Cephalosporins, Levaquin [levofloxacin], Nitrofuran derivatives, Azithromycin, Moxifloxacin hcl, and Phenobarbital   Social History   Socioeconomic History   Marital status: Widowed    Spouse name: Not on file  Number of children: 3   Years of education: Not on file   Highest education level: Not on file  Occupational History   Not on file  Tobacco Use   Smoking status: Former   Smokeless tobacco: Never  Vaping Use   Vaping Use: Never used  Substance and Sexual Activity   Alcohol use: Yes    Comment: Drinks alcohol very infrequently. She typically consumes white wine.   Drug use: Never   Sexual activity: Not on file  Other Topics Concern   Not on file  Social History Narrative   Not on file   Social Determinants of Health   Financial Resource  Strain: Not on file  Food Insecurity: No Food Insecurity   Worried About Running Out of Food in the Last Year: Never true   Ran Out of Food in the Last Year: Never true  Transportation Needs: Unknown   Lack of Transportation (Medical): No   Lack of Transportation (Non-Medical): Not on file  Physical Activity: Not on file  Stress: Not on file  Social Connections: Not on file     Family History: The patient's family history includes Breast cancer in her mother; Colon cancer in her father; Coronary artery disease in an other family member; Hypertension in an other family member; Prostate cancer in her brother. There is no history of Esophageal cancer, Rectal cancer, or Stomach cancer.  ROS:   Review of Systems  Constitution: Negative for decreased appetite, fever and weight gain.  HENT: Negative for congestion, ear discharge, hoarse voice and sore throat.   Eyes: Negative for discharge, redness, vision loss in right eye and visual halos.  Cardiovascular: Negative for chest pain, dyspnea on exertion, leg swelling, orthopnea and palpitations.  Respiratory: Negative for cough, hemoptysis, shortness of breath and snoring.   Endocrine: Negative for heat intolerance and polyphagia.  Hematologic/Lymphatic: Negative for bleeding problem. Does not bruise/bleed easily.  Skin: Negative for flushing, nail changes, rash and suspicious lesions.  Musculoskeletal: Negative for arthritis, joint pain, muscle cramps, myalgias, neck pain and stiffness.  Gastrointestinal: Negative for abdominal pain, bowel incontinence, diarrhea and excessive appetite.  Genitourinary: Negative for decreased libido, genital sores and incomplete emptying.  Neurological: Negative for brief paralysis, focal weakness, headaches and loss of balance.  Psychiatric/Behavioral: Negative for altered mental status, depression and suicidal ideas.  Allergic/Immunologic: Negative for HIV exposure and persistent infections.     EKGs/Labs/Other Studies Reviewed:    The following studies were reviewed today:   EKG:  The ekg ordered today demonstrates [facility] 10/14/2020   Mid LAD lesion is 40% stenosed.   Prox RCA lesion is 25% stenosed.   LV end diastolic pressure is low.   There is no aortic valve stenosis.  Medical therapy for mild to moderate CAD.  Continue aggressive secondary prevention.  Hydration post procedure for renal insufficiency.    Echo 08/28/2020 IMPRESSIONS   1. Left ventricular ejection fraction, by estimation, is 45 to 50%. The left ventricle has mildly decreased function. The left ventricle  demonstrates global hypokinesis. Left ventricular diastolic parameters are  consistent with Grade I diastolic  dysfunction (impaired relaxation).   2. Right ventricular systolic function is normal. The right ventricular  size is normal. There is normal pulmonary artery systolic pressure. The  estimated right ventricular systolic pressure is 02.6 mmHg.   3. The mitral valve is normal in structure. Trivial mitral valve  regurgitation. No evidence of mitral stenosis.   4. The aortic valve is tricuspid. Aortic valve regurgitation is  mild. No  aortic stenosis is present.   5. Aortic dilatation noted. There is mild dilatation of the ascending  aorta, measuring 43 mm.   6. The inferior vena cava is normal in size with greater than 50%  respiratory variability, suggesting right atrial pressure of 3 mmHg.   FINDINGS   Left Ventricle: Left ventricular ejection fraction, by estimation, is 45  to 50%. The left ventricle has mildly decreased function. The left  ventricle demonstrates global hypokinesis. The left ventricular internal  cavity size was normal in size. There is   no left ventricular hypertrophy. Left ventricular diastolic parameters  are consistent with Grade I diastolic dysfunction (impaired relaxation).   Right Ventricle: The right ventricular size is normal. No increase in  right ventricular  wall thickness. Right ventricular systolic function is  normal. There is normal pulmonary artery systolic pressure. The tricuspid  regurgitant velocity is 2.08 m/s, and   with an assumed right atrial pressure of 3 mmHg, the estimated right  ventricular systolic pressure is 48.1 mmHg.   Left Atrium: Left atrial size was normal in size.   Right Atrium: Right atrial size was normal in size.   Pericardium: There is no evidence of pericardial effusion.   Mitral Valve: The mitral valve is normal in structure. Trivial mitral  valve regurgitation. No evidence of mitral valve stenosis.   Tricuspid Valve: The tricuspid valve is normal in structure. Tricuspid  valve regurgitation is trivial.   Aortic Valve: The aortic valve is tricuspid. Aortic valve regurgitation is  mild. Aortic regurgitation PHT measures 449 msec. No aortic stenosis is  present.   Pulmonic Valve: The pulmonic valve was normal in structure. Pulmonic valve  regurgitation is not visualized.   Aorta: The aortic root is normal in size and structure and aortic  dilatation noted. There is mild dilatation of the ascending aorta,  measuring 43 mm.   Venous: The inferior vena cava is normal in size with greater than 50%  respiratory variability, suggesting right atrial pressure of 3 mmHg.   IAS/Shunts: No atrial level shunt detected by color flow Doppler.       Recent Labs: 09/09/2020: ALT 19; TSH 4.420 10/09/2020: Hemoglobin 12.8; Magnesium 2.5; Platelets 234 10/14/2020: BUN 41; Creatinine, Ser 1.91; Potassium 4.2; Sodium 138  Recent Lipid Panel    Component Value Date/Time   CHOL 173 08/28/2020 0351   CHOL 171 06/18/2020 0908   TRIG 85 08/28/2020 0351   HDL 64 08/28/2020 0351   HDL 72 06/18/2020 0908   CHOLHDL 2.7 08/28/2020 0351   VLDL 17 08/28/2020 0351   LDLCALC 92 08/28/2020 0351   LDLCALC 87 06/18/2020 0908    Physical Exam:    VS:  BP 140/80 (BP Location: Left Arm)   Pulse 70   Ht 5\' 3"  (1.6 m)   Wt 132  lb 3.2 oz (60 kg)   SpO2 91%   BMI 23.42 kg/m     Wt Readings from Last 3 Encounters:  11/26/20 132 lb 3.2 oz (60 kg)  10/14/20 129 lb (58.5 kg)  10/09/20 129 lb 1.3 oz (58.6 kg)     GEN: Well nourished, well developed in no acute distress HEENT: Normal NECK: No JVD; No carotid bruits LYMPHATICS: No lymphadenopathy CARDIAC: S1S2 noted,RRR, no murmurs, rubs, gallops RESPIRATORY:  Clear to auscultation without rales, wheezing or rhonchi  ABDOMEN: Soft, non-tender, non-distended, +bowel sounds, no guarding. EXTREMITIES: No edema, No cyanosis, no clubbing MUSCULOSKELETAL:  No deformity  SKIN: Warm and dry NEUROLOGIC:  Alert and oriented x 3, non-focal PSYCHIATRIC:  Normal affect, good insight  ASSESSMENT:    1. Coronary artery disease involving native coronary artery of native heart without angina pectoris   2. Depressed left ventricular systolic function   3. Benign hypertension   4. Mixed hyperlipidemia    PLAN:     She is status post LHC which showed mild nonobstructive coronary artery disease.  She is on Plavix and aspirin patient can to continue at this time.  She will continue symptoms today. Hyperlipidemia - continue with current statin medication. Blood pressure is acceptable, continue with current antihypertensive regimen. Fully compensated.  No signs of heart failure.  Continue beta-blocker.  The patient is in agreement with the above plan. The patient left the office in stable condition.  The patient will follow up in 1 year.   Medication Adjustments/Labs and Tests Ordered: Current medicines are reviewed at length with the patient today.  Concerns regarding medicines are outlined above.  No orders of the defined types were placed in this encounter.  No orders of the defined types were placed in this encounter.   Patient Instructions  Medication Instructions:  Your physician recommends that you continue on your current medications as directed. Please refer to  the Current Medication list given to you today.  *If you need a refill on your cardiac medications before your next appointment, please call your pharmacy*   Lab Work: None If you have labs (blood work) drawn today and your tests are completely normal, you will receive your results only by: [place] (if you have MyChart) OR A paper copy in the mail If you have any lab test that is abnormal or we need to change your treatment, we will call you to review the results.   Testing/Procedures: None   Follow-Up: At [facility], you and your health needs are our priority.  As part of our continuing mission to provide you with exceptional heart care, we have created designated Provider Care Teams.  These Care Teams include your primary Cardiologist (physician) and Advanced Practice Providers (APPs -  Physician Assistants and Nurse Practitioners) who all work together to provide you with the care you need, when you need it.  We recommend signing up for the patient portal called "MyChart".  Sign up information is provided on this After Visit Summary.  MyChart is used to connect with patients for Virtual Visits (Telemedicine).  Patients are able to view lab/test results, encounter notes, upcoming appointments, etc.  Non-urgent messages can be sent to your provider as well.   To learn more about what you can do with MyChart, go to [email or web address].    Your next appointment:   1 year(s)  The format for your next appointment:   In Person  Provider:   Berniece Salines, DO [address], [place], [place] [address]    Other Instructions     Adopting a Healthy Lifestyle.  Know what a healthy weight is for you (roughly BMI <25) and aim to maintain this   Aim for 7+ servings of fruits and vegetables daily   65-80+ fluid ounces of water or unsweet tea for healthy kidneys   Limit to max 1 drink of alcohol per day; avoid smoking/tobacco   Limit animal fats in diet for  cholesterol and heart health - choose grass fed whenever available   Avoid highly processed foods, and foods high in saturated/trans fats   Aim for low stress - take time to unwind and care for  your mental health   Aim for 150 min of moderate intensity exercise weekly for heart health, and weights twice weekly for bone health   Aim for 7-9 hours of sleep daily   When it comes to diets, agreement about the perfect plan isnt easy to find, even among the experts. Experts at the [employer] developed an idea known as the Healthy Eating Plate. Just imagine a plate divided into logical, healthy portions.   The emphasis is on diet quality:   Load up on vegetables and fruits - one-half of your plate: Aim for color and variety, and remember that potatoes dont count.   Go for whole grains - one-quarter of your plate: Whole wheat, barley, wheat berries, quinoa, oats, brown rice, and foods made with them. If you want pasta, go with whole wheat pasta.   Protein power - one-quarter of your plate: Fish, chicken, beans, and nuts are all healthy, versatile protein sources. Limit red meat.   The diet, however, does go beyond the plate, offering a few other suggestions.   Use healthy plant oils, such as olive, canola, soy, corn, sunflower and peanut. Check the labels, and avoid partially hydrogenated oil, which have unhealthy trans fats.   If youre thirsty, drink water. Coffee and tea are good in moderation, but skip sugary drinks and limit milk and dairy products to one or two daily servings.   The type of carbohydrate in the diet is more important than the amount. Some sources of carbohydrates, such as vegetables, fruits, whole grains, and beans-are healthier than others.   Finally, stay active  Signed, Berniece Salines, DO  11/26/2020 9:17 PM    [facility]

## 2020-11-26 NOTE — Patient Instructions (Signed)
Medication Instructions:  Your physician recommends that you continue on your current medications as directed. Please refer to the Current Medication list given to you today.   *If you need a refill on your cardiac medications before your next appointment, please call your pharmacy*   Lab Work: None If you have labs (blood work) drawn today and your tests are completely normal, you will receive your results only by: MyChart Message (if you have MyChart) OR A paper copy in the mail If you have any lab test that is abnormal or we need to change your treatment, we will call you to review the results.   Testing/Procedures: None   Follow-Up: At [facility], you and your health needs are our priority.  As part of our continuing mission to provide you with exceptional heart care, we have created designated Provider Care Teams.  These Care Teams include your primary Cardiologist (physician) and Advanced Practice Providers (APPs -  Physician Assistants and Nurse Practitioners) who all work together to provide you with the care you need, when you need it.  We recommend signing up for the patient portal called "MyChart".  Sign up information is provided on this After Visit Summary.  MyChart is used to connect with patients for Virtual Visits (Telemedicine).  Patients are able to view lab/test results, encounter notes, upcoming appointments, etc.  Non-urgent messages can be sent to your provider as well.   To learn more about what you can do with MyChart, go to [email or web address].    Your next appointment:   1 year(s)  The format for your next appointment:   In Person  Provider:   Kardie Tobb, DO [address], [place], [place] [address]    Other Instructions   

## 2020-12-15 ENCOUNTER — Ambulatory Visit (INDEPENDENT_AMBULATORY_CARE_PROVIDER_SITE_OTHER): Payer: Medicare Other

## 2020-12-15 DIAGNOSIS — Z8673 Personal history of transient ischemic attack (TIA), and cerebral infarction without residual deficits: Secondary | ICD-10-CM | POA: Diagnosis not present

## 2020-12-15 LAB — CUP PACEART REMOTE DEVICE CHECK
Date Time Interrogation Session: 20221018182956
Implantable Pulse Generator Implant Date: 20220708

## 2020-12-23 ENCOUNTER — Other Ambulatory Visit: Payer: Self-pay | Admitting: *Deleted

## 2020-12-23 NOTE — Patient Outreach (Addendum)
[facility]) Care Management  12/23/2020  Claudia Rasmussen [birth year] [id number]   Outgoing call placed to member, no answer, HIPAA compliant voice message left.  Will follow up within the next 3-4 business days.    Update:  Incoming call received back from member.  State she is well, denies any urgent concerns, encouraged to contact this care manager with questions.  Agrees to follow up with health coach for ongoing disease management.  Will place referral.    Care Plan : Stroke (Adult)  Updates made by Valente David, RN since 12/23/2020 12:00 AM     Problem: Recurrence (Stroke)      Long-Range Goal: Stroke Recurrence Prevented or Minimized evidenced by no hospitalizations Completed 12/23/2020  Start Date: 09/10/2020  Expected End Date: 12/10/2020  Recent Progress: On track  Priority: High  Note:   7/20 - Established baseline by comparing pre and poststroke level of function using age-appropriate criteria; consider history of transient ischemia attack.   Promoted long-term management of risk factors, such as tobacco use, sedentary lifestyle, obesity, sleep disordered breathing, unhealthy diet and high alcohol consumption.     Task: Optimize Stroke Recovery Completed 12/23/2020  Due Date: 12/10/2020  Note:   Care Management Activities:    - healthy lifestyle promoted - signs/symptoms of comorbidities monitored - stroke signs/symptoms assessed and compared to baseline    Notes:      Goals Addressed             This Visit's Progress    COMPLETED: [facility] - Keep or Improve My Strength-Stroke       Timeframe:  Long-Range Goal Priority:  Medium Start Date:           7/20                  Expected End Date:    10/20                   Barriers: Knowledge    - eat healthy to increase strength - know who to call for help if I fall - plan activity for times when energy is the highest    Why is this important?   Before the stroke you probably did not  think much about being safe when you are up and about.  Now, it may be harder for you to get around.  It may also be easier for you to trip or fall.  It is common to have muscle weakness after a stroke. You may also feel like you cannot control an arm or leg.  It will be helpful to work with a physical therapist to get your strength and muscle control back.  It is good to stay as active as you can. Walking and stretching help you stay strong and flexible.  The physical therapist will develop an exercise program just for you.     Notes:   7/20 - Discussed interventions to improve strength, such as exercises and adequate sleep.  Will follow up with ortho regarding removal of ortho boot and potential need for PT  8/4 - Member has increased strength and independence, now able to wear a shoe a few times a week, able to bear weight on foot.    Will follow up with PCP on 8/16 and cardiology on 8/18  9/6 - Loop recorder was placed to monitor for risk for recurrent stroke.  Also had cardiac cath last month on 8/23, no interventions.  Will follow up with cardiology  on 10/5.   10/3 - Report she has continued to increase strength, state she is feeling better than she has over the last few months  11/1 - State she is feeling "great" and back to her pre-stroke self.  Increased energy and strength     COMPLETED: [facility] - Track and Manage My Blood Pressure-Hypertension       Timeframe:  Short-Term Goal Priority:  High Start Date:        7/20                     Expected End Date:       10/20 - goal extended  Barriers: Knowledge Other - lack of equipment                  - check blood pressure 3 times per week - write blood pressure results in a log or diary    Why is this important?   You won't feel high blood pressure, but it can still hurt your blood vessels.  High blood pressure can cause heart or kidney problems. It can also cause a stroke.  Making lifestyle changes like losing a little weight  or eating less salt will help.  Checking your blood pressure at home and at different times of the day can help to control blood pressure.  If the doctor prescribes medicine remember to take it the way the doctor ordered.  Call the office if you cannot afford the medicine or if there are questions about it.     Notes:   7/20 - Discussed importance of monitoring BP often with start of new medication.  Advised to have clinic at [facility] check and document in log.  BP log sent to member.  8/4 - Report she has borrowed a blood pressure monitor to self monitor, will record in log daily.  Advised that management of BP will help with decreasing risk of recurrent stroke  9/6 - Blood pressure today was 134/81, denies any extreme hypertensive episodes.  Continues to record to share with physicians  10/3 - Report BP has remained stable, monitoring daily and recording to share trends with MD  11/1 - Remains compliant with self monitoring, state she is doing well, no consistent abnormal readings          Valente David, Therapist, sports, MSN [place] Manager [phone]

## 2020-12-23 NOTE — Progress Notes (Signed)
Carelink Summary Report / Loop Recorder 

## 2020-12-23 NOTE — Addendum Note (Signed)
Addended byValente David on: 12/23/2020 03:04 PM   Modules accepted: Orders

## 2020-12-24 ENCOUNTER — Other Ambulatory Visit: Payer: Self-pay | Admitting: *Deleted

## 2020-12-27 ENCOUNTER — Other Ambulatory Visit: Payer: Self-pay | Admitting: Family Medicine

## 2021-01-05 DIAGNOSIS — H18523 Epithelial (juvenile) corneal dystrophy, bilateral: Secondary | ICD-10-CM | POA: Diagnosis not present

## 2021-01-05 DIAGNOSIS — H52203 Unspecified astigmatism, bilateral: Secondary | ICD-10-CM | POA: Diagnosis not present

## 2021-01-05 DIAGNOSIS — H5203 Hypermetropia, bilateral: Secondary | ICD-10-CM | POA: Diagnosis not present

## 2021-01-05 DIAGNOSIS — H16223 Keratoconjunctivitis sicca, not specified as Sjogren's, bilateral: Secondary | ICD-10-CM | POA: Diagnosis not present

## 2021-01-05 DIAGNOSIS — H524 Presbyopia: Secondary | ICD-10-CM | POA: Diagnosis not present

## 2021-01-08 ENCOUNTER — Ambulatory Visit: Payer: Medicare Other | Admitting: Physician Assistant

## 2021-01-13 ENCOUNTER — Other Ambulatory Visit: Payer: Self-pay

## 2021-01-13 ENCOUNTER — Encounter: Payer: Self-pay | Admitting: Physician Assistant

## 2021-01-13 ENCOUNTER — Ambulatory Visit (INDEPENDENT_AMBULATORY_CARE_PROVIDER_SITE_OTHER): Payer: Medicare Other | Admitting: Physician Assistant

## 2021-01-13 VITALS — BP 128/74 | HR 68 | Temp 97.2°F | Ht 63.0 in | Wt 130.4 lb

## 2021-01-13 DIAGNOSIS — E038 Other specified hypothyroidism: Secondary | ICD-10-CM

## 2021-01-13 DIAGNOSIS — E782 Mixed hyperlipidemia: Secondary | ICD-10-CM

## 2021-01-13 DIAGNOSIS — N1832 Chronic kidney disease, stage 3b: Secondary | ICD-10-CM

## 2021-01-13 DIAGNOSIS — E559 Vitamin D deficiency, unspecified: Secondary | ICD-10-CM | POA: Diagnosis not present

## 2021-01-13 DIAGNOSIS — I10 Essential (primary) hypertension: Secondary | ICD-10-CM | POA: Diagnosis not present

## 2021-01-13 MED ORDER — MONTELUKAST SODIUM 10 MG PO TABS: 10.0000 mg | ORAL_TABLET | Freq: Every day | ORAL | 3 refills | Status: DC

## 2021-01-13 NOTE — Progress Notes (Signed)
Established Patient Office Visit  Subjective:  Patient ID: Claudia Rasmussen, female    DOB: 1931-03-18  Age: 87 y.o. MRN: [id number]  CC:  Chief Complaint  Patient presents with   Hypertension    HPI Claudia Rasmussen presents for follow up hypothyroidism  Claudia Rasmussen presents with atrophy of thyroid (acquired).  Date of diagnosis 2002.  She is currently taking Synthroid, 75 mcg daily. Is due for labwork today    Pt presents with hyperlipidemia.  Date of diagnosis 09/2009.  Current treatment includes lipitor $RemoveBeforeDE'40mg'MaRjOOEWMvfstku$  qd Compliance with treatment has been good; she takes her medication as directed, maintains her low cholesterol diet, follows up as directed, and maintains her exercise regimen.  She denies experiencing any hypercholesterolemia related symptoms  Dx with age-related osteoporosis without current pathological fracture; this has been a problem for the past several years.  She describes the intensity of arthralgias as moderate.  Tests done to help confirm the diagnosis and evaluate for treatable causes include bone densinometry test ( decreased bone mass ). She is currently on Prolia injections    Pt with history of Chronic Kidney disease - is following with nephrology - states everything has been stable recently - she will follow again with them in 3 months  Pt states that she has had cough, mild allergy congestion and is taking zyrtec at night and actually at times claritin through the day - recommend to only take one and will try to add in singulair and see if helpful .  Past Medical History:  Diagnosis Date   Age-related osteoporosis without current pathological fracture    Arthritis    Atrophy of thyroid (acquired)    Benign hypertension 09/09/2020   Blepharitis 2021   Cholelithiases    Clostridium difficile infection    Diverticulitis    Diverticulitis of large intestine without perforation or abscess without bleeding    Family history of colon cancer    Migraine without aura,  not intractable, without status migrainosus    Mixed hyperlipidemia    Stroke [facility])    Thyroid disease    UTI (urinary tract infection)    Vaginal prolapse    Vitamin D deficiency, unspecified     Past Surgical History:  Procedure Laterality Date   ABDOMINAL HYSTERECTOMY     APPENDECTOMY  1969   CATARACT EXTRACTION, BILATERAL Bilateral    right- 03/22/2018 and left 05/10/2018   COLONOSCOPY  05/12/2009   Colonic polyp status post polypectomy. Moderate predominanltly left colonic diverticulosis. Internal hemorrhoids   LEFT HEART CATH AND CORONARY ANGIOGRAPHY N/A 10/14/2020   Procedure: LEFT HEART CATH AND CORONARY ANGIOGRAPHY;  Surgeon: Jettie Booze, MD;  Location: [facility];  Service: Cardiovascular;  Laterality: N/A;   LOOP RECORDER INSERTION N/A 08/29/2020   Procedure: LOOP RECORDER INSERTION;  Surgeon: Vickie Epley, MD;  Location: [facility];  Service: Cardiovascular;  Laterality: N/A;   OOPHORECTOMY Left     Family History  Problem Relation Age of Onset   Breast cancer Mother    Colon cancer Father    Prostate cancer Brother    Coronary artery disease Other    Hypertension Other    Esophageal cancer Neg Hx    Rectal cancer Neg Hx    Stomach cancer Neg Hx     Social History   Socioeconomic History   Marital status: Widowed    Spouse name: Not on file   Number of children: 3   Years of education: Not on file  Highest education level: Not on file  Occupational History   Not on file  Tobacco Use   Smoking status: Former   Smokeless tobacco: Never  Vaping Use   Vaping Use: Never used  Substance and Sexual Activity   Alcohol use: Yes    Comment: Drinks alcohol very infrequently. She typically consumes white wine.   Drug use: Never   Sexual activity: Not on file  Other Topics Concern   Not on file  Social History Narrative   Not on file   Social Determinants of Health   Financial Resource Strain: Not on file  Food Insecurity: No  Food Insecurity   Worried About Running Out of Food in the Last Year: Never true   Ran Out of Food in the Last Year: Never true  Transportation Needs: Unknown   Lack of Transportation (Medical): No   Lack of Transportation (Non-Medical): Not on file  Physical Activity: Not on file  Stress: Not on file  Social Connections: Not on file  Intimate Partner Violence: Not on file     Current Outpatient Medications:    atorvastatin (LIPITOR) 40 MG tablet, TAKE 1 TABLET(40 MG) BY MOUTH DAILY, Disp: 90 tablet, Rfl: 1   bethanechol (URECHOLINE) 25 MG tablet, Take 25 mg by mouth in the morning., Disp: , Rfl:    cetirizine (ZYRTEC) 10 MG tablet, Take 1 tablet (10 mg total) by mouth at bedtime., Disp: 90 tablet, Rfl: 3   Cholecalciferol (VITAMIN D3) 25 MCG (1000 UT) CAPS, Take 1,000 Units by mouth in the morning., Disp: , Rfl:    clopidogrel (PLAVIX) 75 MG tablet, Take 75 mg by mouth in the morning., Disp: , Rfl:    denosumab (PROLIA) 60 MG/ML SOSY injection, Inject 60 mg into the skin every 6 (six) months., Disp: , Rfl:    levothyroxine (SYNTHROID) 75 MCG tablet, TAKE 1 TABLET DAILY, Disp: 90 tablet, Rfl: 1   montelukast (SINGULAIR) 10 MG tablet, Take 1 tablet (10 mg total) by mouth at bedtime., Disp: 30 tablet, Rfl: 3   nebivolol (BYSTOLIC) 2.5 MG tablet, Take 1 tablet (2.5 mg total) by mouth daily., Disp: 90 tablet, Rfl: 3   Polyvinyl Alcohol-Povidone (REFRESH OP), Place 1 drop into both eyes in the morning and at bedtime., Disp: , Rfl:    Probiotic Product (PROBIOTIC-10 PO), Take 1 capsule by mouth daily., Disp: , Rfl:    nitroGLYCERIN (NITROSTAT) 0.4 MG SL tablet, Place 1 tablet (0.4 mg total) under the tongue every 5 (five) minutes as needed for chest pain., Disp: 90 tablet, Rfl: 3   Allergies  Allergen Reactions   Shellfish Allergy Other (See Comments)    Itching, Hives Itching, Hives   Cephalosporins Other (See Comments)    Itching    Levaquin [Levofloxacin]    Nitrofuran Derivatives  Other (See Comments)    Made her not feel good, caused tremors, increased creatnine   Azithromycin Rash   Moxifloxacin Hcl Rash   Phenobarbital Rash   CONSTITUTIONAL: Negative for chills, fatigue, fever, unintentional weight gain and unintentional weight loss.  E/N/T: see HPI CARDIOVASCULAR: Negative for chest pain, dizziness, palpitations and pedal edema.  RESPIRATORY: Negative for recent cough and dyspnea.  GASTROINTESTINAL: Negative for abdominal pain, acid reflux symptoms, constipation, diarrhea, nausea and vomiting.  MSK: Negative for arthralgias and myalgias.  INTEGUMENTARY: Negative for rash.  NEUROLOGICAL: Negative for dizziness and headaches.  PSYCHIATRIC: Negative for sleep disturbance and to question depression screen.  Negative for depression, negative for anhedonia.  Objective:  PHYSICAL EXAM:   VS: BP 128/74   Pulse 68   Temp (!) 97.2 F (36.2 C) (Temporal)   Ht $R'5\' 3"'jo$  (1.6 m)   Wt 130 lb 6.4 oz (59.1 kg)   SpO2 98%   BMI 23.10 kg/m   GEN: Well nourished, well developed, in no acute distress  HEENT: normal external ears and nose - normal external auditory canals and TMS - hearing grossly normal - - Lips, Teeth and Gums - normal  Oropharynx - normal mucosa, palate, and posterior pharynx Cardiac: RRR; no murmurs, rubs, or gallops,no edema - Respiratory:  normal respiratory rate and pattern with no distress - normal breath sounds with no rales, rhonchi, wheezes or rubs Skin: warm and dry, no rash  Psych: euthymic mood, appropriate affect and demeanor   Health Maintenance Due  Topic Date Due   TETANUS/TDAP  Never done   Zoster Vaccines- Shingrix (1 of 2) Never done   COVID-19 Vaccine (4 - Booster for Moderna series) 02/12/2020   MAMMOGRAM  04/16/2020    There are no preventive care reminders to display for this patient.  Lab Results  Component Value Date   TSH 4.420 09/09/2020   Lab Results  Component Value Date   WBC 6.7 10/09/2020   HGB 12.8  10/09/2020   HCT 38.7 10/09/2020   MCV 92 10/09/2020   PLT 234 10/09/2020   Lab Results  Component Value Date   NA 138 10/14/2020   K 4.2 10/14/2020   CO2 21 (L) 10/14/2020   GLUCOSE 95 10/14/2020   BUN 41 (H) 10/14/2020   CREATININE 1.91 (H) 10/14/2020   BILITOT 0.6 09/09/2020   ALKPHOS 95 09/09/2020   AST 21 09/09/2020   ALT 19 09/09/2020   PROT 7.0 09/09/2020   ALBUMIN 4.3 09/09/2020   CALCIUM 9.1 10/14/2020   ANIONGAP 5 10/14/2020   EGFR 28 (L) 10/09/2020   Lab Results  Component Value Date   CHOL 173 08/28/2020   Lab Results  Component Value Date   HDL 64 08/28/2020   Lab Results  Component Value Date   LDLCALC 92 08/28/2020   Lab Results  Component Value Date   TRIG 85 08/28/2020   Lab Results  Component Value Date   CHOLHDL 2.7 08/28/2020   Lab Results  Component Value Date   HGBA1C 5.4 08/28/2020      Assessment & Plan:   Problem List Items Addressed This Visit       Endocrine   Other specified hypothyroidism - Primary   Relevant Orders   TSH Continue current meds     Genitourinary   Stage 3b chronic kidney disease ([place]) Continue follow up with nephrology     Other   Mixed hyperlipidemia   Relevant Orders   Lipid panel Continue meds   Vit D def Labwork pending  Allergic rhinitis Rx for singulair $RemoveBefor'10mg'LlhyDBfUNnVh$  qd                         Meds ordered this encounter  Medications   montelukast (SINGULAIR) 10 MG tablet    Sig: Take 1 tablet (10 mg total) by mouth at bedtime.    Dispense:  30 tablet    Refill:  3    Order Specific Question:   Supervising Provider    AnswerShelton Silvas     Follow-up: Return in about 4 months (around 05/13/2021) for chronic fasting follow up.    SARA R  Lea Walbert, [name]

## 2021-01-14 LAB — CBC WITH DIFFERENTIAL/PLATELET
Basophils Absolute: 0.1 10*3/uL (ref 0.0–0.2)
Basos: 2 %
EOS (ABSOLUTE): 0.3 10*3/uL (ref 0.0–0.4)
Eos: 5 %
Hematocrit: 38.5 % (ref 34.0–46.6)
Hemoglobin: 12.4 g/dL (ref 11.1–15.9)
Immature Grans (Abs): 0.1 10*3/uL (ref 0.0–0.1)
Immature Granulocytes: 1 %
Lymphocytes Absolute: 1.3 10*3/uL (ref 0.7–3.1)
Lymphs: 22 %
MCH: 30.2 pg (ref 26.6–33.0)
MCHC: 32.2 g/dL (ref 31.5–35.7)
MCV: 94 fL (ref 79–97)
Monocytes Absolute: 0.6 10*3/uL (ref 0.1–0.9)
Monocytes: 10 %
Neutrophils Absolute: 3.6 10*3/uL (ref 1.4–7.0)
Neutrophils: 60 %
Platelets: 188 10*3/uL (ref 150–450)
RBC: 4.11 x10E6/uL (ref 3.77–5.28)
RDW: 13.2 % (ref 11.7–15.4)
WBC: 5.9 10*3/uL (ref 3.4–10.8)

## 2021-01-14 LAB — LIPID PANEL
Chol/HDL Ratio: 2.3 ratio (ref 0.0–4.4)
Cholesterol, Total: 145 mg/dL (ref 100–199)
HDL: 62 mg/dL (ref >39)
LDL Chol Calc (NIH): 71 mg/dL (ref 0–99)
Triglycerides: 60 mg/dL (ref 0–149)
VLDL Cholesterol Cal: 12 mg/dL (ref 5–40)

## 2021-01-14 LAB — COMPREHENSIVE METABOLIC PANEL
ALT: 28 IU/L (ref 0–32)
AST: 31 IU/L (ref 0–40)
Albumin/Globulin Ratio: 1.5 (ref 1.2–2.2)
Albumin: 4.2 g/dL (ref 3.6–4.6)
Alkaline Phosphatase: 146 IU/L — ABNORMAL HIGH (ref 44–121)
BUN/Creatinine Ratio: 19 (ref 12–28)
BUN: 38 mg/dL — ABNORMAL HIGH (ref 8–27)
Bilirubin Total: 0.9 mg/dL (ref 0.0–1.2)
CO2: 19 mmol/L — ABNORMAL LOW (ref 20–29)
Calcium: 9.2 mg/dL (ref 8.7–10.3)
Chloride: 106 mmol/L (ref 96–106)
Creatinine, Ser: 1.97 mg/dL — ABNORMAL HIGH (ref 0.57–1.00)
Globulin, Total: 2.8 g/dL (ref 1.5–4.5)
Glucose: 92 mg/dL (ref 70–99)
Potassium: 5.3 mmol/L — ABNORMAL HIGH (ref 3.5–5.2)
Sodium: 140 mmol/L (ref 134–144)
Total Protein: 7 g/dL (ref 6.0–8.5)
eGFR: 24 mL/min/{1.73_m2} — ABNORMAL LOW (ref >59)

## 2021-01-14 LAB — TSH: TSH: 2.64 u[IU]/mL (ref 0.450–4.500)

## 2021-01-14 LAB — VITAMIN D 25 HYDROXY (VIT D DEFICIENCY, FRACTURES): Vit D, 25-Hydroxy: 52.3 ng/mL (ref 30.0–100.0)

## 2021-01-14 LAB — CARDIOVASCULAR RISK ASSESSMENT

## 2021-01-18 LAB — CUP PACEART REMOTE DEVICE CHECK
Date Time Interrogation Session: 20221120183058
Implantable Pulse Generator Implant Date: 20220708

## 2021-01-19 ENCOUNTER — Ambulatory Visit (INDEPENDENT_AMBULATORY_CARE_PROVIDER_SITE_OTHER): Payer: Medicare Other

## 2021-01-19 DIAGNOSIS — Z8673 Personal history of transient ischemic attack (TIA), and cerebral infarction without residual deficits: Secondary | ICD-10-CM | POA: Diagnosis not present

## 2021-01-23 ENCOUNTER — Other Ambulatory Visit: Payer: Self-pay | Admitting: *Deleted

## 2021-01-23 ENCOUNTER — Encounter: Payer: Self-pay | Admitting: *Deleted

## 2021-01-23 NOTE — Patient Outreach (Signed)
[facility]) Care Management  01/23/2021  [name] 10/24/1931 [id number]   RN Health Coach attempted follow up outreach call to patient.  Patient was unavailable. HIPPA compliance voicemail message left with return callback number.  Plan: RN will call patient again within 30 days.  [facility] Management [phone]

## 2021-01-23 NOTE — Patient Outreach (Signed)
[name]) Care Management [place] Note   01/23/2021 Name:  Claudia Rasmussen MRN:  [id number] DOB:  February 10, 1932  Summary: Per patient her blood pressure has been elevated at times. Patient stated her appetite is good.  She is monitoring her sodium in her diet. She receives one meal at the independent facility and she cooks the others for herself. Patient stated she is monitoring the potassium in her diet because  of her kidney disease. She has not been exercising. Per patient she has many exercise classes offered at her facility. Patient stated she just need to start.  Recommendations/Changes made from today's visit: Patient will start an exercise routine at her facility Monitoring sodium and  potassium in diet Increase water intake Monitor blood pressure at home    Subjective: Claudia Rasmussen is an 87 y.o. year old female who is a primary patient of Marge Duncans, [place]. The care management team was consulted for assistance with care management and/or care coordination needs.    RN Health Coach completed Telephone Visit today.   Objective:  Medications Reviewed Today     Reviewed by Eliot Ford (Physician Assistant) on 01/13/21 at [place] List Status: <None>   Medication Order Taking? Sig Documenting Provider Last Dose Status Informant  atorvastatin (LIPITOR) 40 MG tablet [id number] Yes TAKE 1 TABLET(40 MG) BY MOUTH DAILY Marge Duncans, [name] Taking Active Self  bethanechol (URECHOLINE) 25 MG tablet [id number] Yes Take 25 mg by mouth in the morning. [provider] Taking Active Self  cetirizine (ZYRTEC) 10 MG tablet [id number] Yes Take 1 tablet (10 mg total) by mouth at bedtime. Cox, Kirsten, MD Taking Active   Cholecalciferol (VITAMIN D3) 25 MCG (1000 UT) CAPS [id number] Yes Take 1,000 Units by mouth in the morning. [provider] Taking Active Self  clopidogrel (PLAVIX) 75 MG tablet [id number] Yes Take 75 mg by mouth in the morning.  [provider] Taking Active Self  denosumab (PROLIA) 60 MG/ML SOSY injection [id number] Yes Inject 60 mg into the skin every 6 (six) months. [provider] Taking Active Self  levothyroxine (SYNTHROID) 75 MCG tablet [id number] Yes TAKE 1 TABLET DAILY Marge Duncans, [name] Taking Active Self  nebivolol (BYSTOLIC) 2.5 MG tablet [id number] Yes Take 1 tablet (2.5 mg total) by mouth daily. Marge Duncans, [name] Taking Active Self  nitroGLYCERIN (NITROSTAT) 0.4 MG SL tablet [id number]  Place 1 tablet (0.4 mg total) under the tongue every 5 (five) minutes as needed for chest pain. Berniece Salines, DO  Expired 01/07/21 2359 Self           Med Note Ala Dach   Tue Oct 14, 2020  7:38 AM) Never used as of 10/14/20.  Polyvinyl Alcohol-Povidone (REFRESH OP) [id number] Yes Place 1 drop into both eyes in the morning and at bedtime. [provider] Taking Active Self  Probiotic Product (PROBIOTIC-10 PO) [id number] Yes Take 1 capsule by mouth daily. [provider] Taking Active Self             SDOH:  (Social Determinants of Health) assessments and interventions performed:  SDOH Interventions    Flowsheet Row Most Recent Value  SDOH Interventions   Food Insecurity Interventions Intervention Not Indicated  Housing Interventions Intervention Not Indicated  Transportation Interventions Intervention Not Indicated       Care Plan  Review of patient past medical history, allergies, medications, health status, including review of consultants reports, laboratory and other test data, was performed as part of comprehensive  evaluation for care management services.   Care Plan : RN Care Manager Plan of Care  Updates made by Daesha Insco, Eppie Gibson, RN since 01/23/2021 12:00 AM     Problem: Knowledge Deficit related to Hypertension and Care Coordination Needs   Priority: High     Long-Range Goal: Development Plan of Care for Management of Hypertension   Start Date: 01/23/2021   Expected End Date: 02/20/2022  Priority: High  Note:   Current Barriers:  Knowledge Deficits related to plan of care for management of HTN   RNCM Clinical Goal(s):  Patient will verbalize understanding of plan for management of HTN as evidenced by continuation of monitoring blood pressure and adhering to low sodium diet  through collaboration with RN Care manager, provider, and care team.   Interventions: Inter-disciplinary care team collaboration (see longitudinal plan of care) Evaluation of current treatment plan related to  self management and patient's adherence to plan as established by provider Patient will look into exercise routine at her independent facility Provided F.A.S.T. acronym sheet for stroke Provided Educational material on Low potassium diet Provided educational material on Renal diet    Patient Goals/Self-Care Activities: Attend all scheduled provider appointments Call pharmacy for medication refills 3-7 days in advance of running out of medications Attend church or other social activities Perform all self care activities independently  Perform IADL's (shopping, preparing meals, housekeeping, managing finances) independently Call provider office for new concerns or questions  check blood pressure weekly choose a place to take my blood pressure (home, clinic or office, retail store) call doctor for signs and symptoms of high blood pressure keep all doctor appointments take medications for blood pressure exactly as prescribed begin an exercise program limit salt intake to 2300mg /day renal diet  and decrease potassium        Plan: Telephone follow up appointment with care management team member scheduled for:  April 24, 2021 The patient has been provided with contact information for the care management team and has been advised to call with any health related questions or concerns.   [facility]  Management [phone]

## 2021-01-23 NOTE — Patient Instructions (Signed)
Visit Information  Thank you for taking time to visit with me today. Please don't hesitate to contact me if I can be of assistance to you before our next scheduled telephone appointment.   Our next appointment is by telephone on March 2023  Please call the care guide team at [phone] if you need to cancel or reschedule your appointment.   Please call the Suicide and Crisis Lifeline: 988 if you are experiencing a Mental Health or [place] or need someone to talk to.  Following is a copy of your care plan:  Care Plan : [place] of Care  Updates made by Steen Bisig, Eppie Gibson, RN since 01/23/2021 12:00 AM     Problem: Knowledge Deficit related to Hypertension and Care Coordination Needs   Priority: High     Long-Range Goal: Development Plan of Care for Management of Hypertension   Start Date: 01/23/2021  Expected End Date: 02/20/2022  Priority: High  Note:   Current Barriers:  Knowledge Deficits related to plan of care for management of HTN   RNCM Clinical Goal(s):  Patient will verbalize understanding of plan for management of HTN as evidenced by continuation of monitoring blood pressure and adhering to low sodium diet  through collaboration with RN Care manager, provider, and care team.   Interventions: Inter-disciplinary care team collaboration (see longitudinal plan of care) Evaluation of current treatment plan related to  self management and patient's adherence to plan as established by provider Patient will look into exercise routine at her independent facility Provided F.A.S.T. acronym sheet for stroke Provided Educational material on Low potassium diet Provided educational material on Renal diet    Patient Goals/Self-Care Activities: Attend all scheduled provider appointments Call pharmacy for medication refills 3-7 days in advance of running out of medications Attend church or other social activities Perform all self care activities  independently  Perform IADL's (shopping, preparing meals, housekeeping, managing finances) independently Call provider office for new concerns or questions  check blood pressure weekly choose a place to take my blood pressure (home, clinic or office, retail store) call doctor for signs and symptoms of high blood pressure keep all doctor appointments take medications for blood pressure exactly as prescribed begin an exercise program limit salt intake to 2300mg /day renal diet  and decrease potassium       The patient verbalized understanding of instructions, educational materials, and care plan provided today and agreed to receive a mailed copy of patient instructions, educational materials, and care plan.   Telephone follow up appointment with care management team member scheduled for: The patient has been provided with contact information for the care management team and has been advised to call with any health related questions or concerns.    [facility] Management [phone]

## 2021-01-28 NOTE — Progress Notes (Signed)
Carelink Summary Report / Loop Recorder 

## 2021-01-29 DIAGNOSIS — N390 Urinary tract infection, site not specified: Secondary | ICD-10-CM | POA: Diagnosis not present

## 2021-01-29 DIAGNOSIS — R339 Retention of urine, unspecified: Secondary | ICD-10-CM | POA: Diagnosis not present

## 2021-01-29 DIAGNOSIS — N8110 Cystocele, unspecified: Secondary | ICD-10-CM | POA: Diagnosis not present

## 2021-02-04 ENCOUNTER — Other Ambulatory Visit: Payer: Self-pay | Admitting: Physician Assistant

## 2021-02-16 ENCOUNTER — Other Ambulatory Visit: Payer: Self-pay | Admitting: Physician Assistant

## 2021-02-16 LAB — CUP PACEART REMOTE DEVICE CHECK
Date Time Interrogation Session: 20221223183229
Implantable Pulse Generator Implant Date: 20220708

## 2021-02-17 ENCOUNTER — Encounter: Payer: Self-pay | Admitting: Physician Assistant

## 2021-02-17 NOTE — Telephone Encounter (Signed)
I called the pt. Per Dr.Cox pt needs a mychart video appointment. Appointment has been scheduled for first thing in the morning with Dr. Tobie Poet.

## 2021-02-17 NOTE — Progress Notes (Signed)
Virtual Visit via Video Note   This visit type was conducted due to national recommendations for restrictions regarding the COVID-19 Pandemic (e.g. social distancing) in an effort to limit this patient's exposure and mitigate transmission in our community.  Due to her co-morbid illnesses, this patient is at least at moderate risk for complications without adequate follow up.  This format is felt to be most appropriate for this patient at this time.  All issues noted in this document were discussed and addressed.  A limited physical exam was performed with this format.  A verbal consent was obtained for the virtual visit.   Patient Location:home Provider Location:office Evaluation Performed:  acute  Acute Office Visit  Subjective:    Patient ID: Claudia Rasmussen, female    DOB: September 30, 1931, 87 y.o.   MRN: [id number]  Chief Complaint  Patient presents with   COVID positive home test    HPI Patient is in today for positive Covid test. Lives at [facility]. Was positive on Monday with at home test. Oxygen level normal on Monday. Neighbor tested positive 1 week ago today. C/o chills, sore throat (resolved), headache (resolved), some congestion. 5 covid immunizations. No sense of taste or smell. Decreased appetite, but is eating. Drinking.   Past Medical History:  Diagnosis Date   Age-related osteoporosis without current pathological fracture    Arthritis    Atrophy of thyroid (acquired)    Benign hypertension 09/09/2020   Blepharitis 2021   Cholelithiases    Clostridium difficile infection    Diverticulitis    Diverticulitis of large intestine without perforation or abscess without bleeding    Family history of colon cancer    Migraine without aura, not intractable, without status migrainosus    Mixed hyperlipidemia    Stroke [facility])    Thyroid disease    UTI (urinary tract infection)    Vaginal prolapse    Vitamin D deficiency, unspecified     Past Surgical History:  Procedure  Laterality Date   ABDOMINAL HYSTERECTOMY     APPENDECTOMY  1969   CATARACT EXTRACTION, BILATERAL Bilateral    right- 03/22/2018 and left 05/10/2018   COLONOSCOPY  05/12/2009   Colonic polyp status post polypectomy. Moderate predominanltly left colonic diverticulosis. Internal hemorrhoids   LEFT HEART CATH AND CORONARY ANGIOGRAPHY N/A 10/14/2020   Procedure: LEFT HEART CATH AND CORONARY ANGIOGRAPHY;  Surgeon: Jettie Booze, MD;  Location: [facility];  Service: Cardiovascular;  Laterality: N/A;   LOOP RECORDER INSERTION N/A 08/29/2020   Procedure: LOOP RECORDER INSERTION;  Surgeon: Vickie Epley, MD;  Location: [facility];  Service: Cardiovascular;  Laterality: N/A;   OOPHORECTOMY Left     Family History  Problem Relation Age of Onset   Breast cancer Mother    Colon cancer Father    Prostate cancer Brother    Coronary artery disease Other    Hypertension Other    Esophageal cancer Neg Hx    Rectal cancer Neg Hx    Stomach cancer Neg Hx     Social History   Socioeconomic History   Marital status: Widowed    Spouse name: Not on file   Number of children: 3   Years of education: Not on file   Highest education level: Not on file  Occupational History   Not on file  Tobacco Use   Smoking status: Former   Smokeless tobacco: Never  Vaping Use   Vaping Use: Never used  Substance and  Sexual Activity   Alcohol use: Yes    Comment: Drinks alcohol very infrequently. She typically consumes white wine.   Drug use: Never   Sexual activity: Not on file  Other Topics Concern   Not on file  Social History Narrative   Not on file   Social Determinants of Health   Financial Resource Strain: Not on file  Food Insecurity: No Food Insecurity   Worried About Running Out of Food in the Last Year: Never true   Ran Out of Food in the Last Year: Never true  Transportation Needs: No Transportation Needs   Lack of Transportation (Medical): No   Lack of  Transportation (Non-Medical): No  Physical Activity: Not on file  Stress: Not on file  Social Connections: Not on file  Intimate Partner Violence: Not on file    Outpatient Medications Prior to Visit  Medication Sig Dispense Refill   atorvastatin (LIPITOR) 40 MG tablet TAKE 1 TABLET(40 MG) BY MOUTH DAILY 90 tablet 1   cetirizine (ZYRTEC) 10 MG tablet Take 1 tablet (10 mg total) by mouth at bedtime. 90 tablet 3   Cholecalciferol (VITAMIN D3) 25 MCG (1000 UT) CAPS Take 1,000 Units by mouth in the morning.     clopidogrel (PLAVIX) 75 MG tablet TAKE 1 TABLET DAILY 90 tablet 3   denosumab (PROLIA) 60 MG/ML SOSY injection Inject 60 mg into the skin every 6 (six) months.     levothyroxine (SYNTHROID) 75 MCG tablet TAKE 1 TABLET DAILY 90 tablet 3   nebivolol (BYSTOLIC) 2.5 MG tablet Take 1 tablet (2.5 mg total) by mouth daily. 90 tablet 3   nitroGLYCERIN (NITROSTAT) 0.4 MG SL tablet Place 1 tablet (0.4 mg total) under the tongue every 5 (five) minutes as needed for chest pain. 90 tablet 3   Polyvinyl Alcohol-Povidone (REFRESH OP) Place 1 drop into both eyes in the morning and at bedtime.     Probiotic Product (PROBIOTIC-10 PO) Take 1 capsule by mouth daily.     bethanechol (URECHOLINE) 25 MG tablet Take 25 mg by mouth in the morning.     montelukast (SINGULAIR) 10 MG tablet Take 1 tablet (10 mg total) by mouth at bedtime. 30 tablet 3   No facility-administered medications prior to visit.    Allergies  Allergen Reactions   Shellfish Allergy Other (See Comments)    Itching, Hives Itching, Hives   Cephalosporins Other (See Comments)    Itching    Levaquin [Levofloxacin]    Nitrofuran Derivatives Other (See Comments)    Made her not feel good, caused tremors, increased creatnine   Azithromycin Rash   Moxifloxacin Hcl Rash   Phenobarbital Rash    Review of Systems  Constitutional:  Positive for chills and fatigue. Negative for fever.  HENT:  Positive for postnasal drip, rhinorrhea and  sore throat (resolved). Negative for congestion, ear pain, sinus pressure and sinus pain.   Respiratory:  Positive for cough. Negative for shortness of breath.   Cardiovascular:  Negative for chest pain.  Gastrointestinal:  Negative for diarrhea and nausea.  Musculoskeletal:  Negative for arthralgias and myalgias.  Neurological:  Negative for dizziness and headaches.      Objective:    Physical Exam Vitals reviewed.  Constitutional:      Appearance: Normal appearance. She is not ill-appearing.     Comments: Appears tired. No respiratory distress.   Neurological:     Mental Status: She is alert.    BP 128/72    Temp 98.2 F (36.8  C)    Ht 5' 3" (1.6 m)    BMI 23.10 kg/m  Wt Readings from Last 3 Encounters:  01/13/21 130 lb 6.4 oz (59.1 kg)  11/26/20 132 lb 3.2 oz (60 kg)  10/14/20 129 lb (58.5 kg)    Health Maintenance Due  Topic Date Due   Zoster Vaccines- Shingrix (1 of 2) Never done   COVID-19 Vaccine (4 - Booster for Moderna series) 02/12/2020    There are no preventive care reminders to display for this patient.   Lab Results  Component Value Date   TSH 2.640 01/13/2021   Lab Results  Component Value Date   WBC 5.9 01/13/2021   HGB 12.4 01/13/2021   HCT 38.5 01/13/2021   MCV 94 01/13/2021   PLT 188 01/13/2021   Lab Results  Component Value Date   NA 140 01/13/2021   K 5.3 (H) 01/13/2021   CO2 19 (L) 01/13/2021   GLUCOSE 92 01/13/2021   BUN 38 (H) 01/13/2021   CREATININE 1.97 (H) 01/13/2021   BILITOT 0.9 01/13/2021   ALKPHOS 146 (H) 01/13/2021   AST 31 01/13/2021   ALT 28 01/13/2021   PROT 7.0 01/13/2021   ALBUMIN 4.2 01/13/2021   CALCIUM 9.2 01/13/2021   ANIONGAP 5 10/14/2020   EGFR 24 (L) 01/13/2021   Lab Results  Component Value Date   CHOL 145 01/13/2021   Lab Results  Component Value Date   HDL 62 01/13/2021   Lab Results  Component Value Date   LDLCALC 71 01/13/2021   Lab Results  Component Value Date   TRIG 60 01/13/2021    Lab Results  Component Value Date   CHOLHDL 2.3 01/13/2021   Lab Results  Component Value Date   HGBA1C 5.4 08/28/2020         Assessment & Plan:   Problem List Items Addressed This Visit       Respiratory   Upper respiratory tract infection due to COVID-19 virus - Primary    Continue symptomatic treatment with tylenol, rest, fluids, and mucinex.  Molnupirivir sent.   You should remain isolated and quarantine for at least 5 days from start of symptoms. You must be feeling better and be fever free without any fever reducers for at least 24 hours as well. You should wear a mask at all times when out of your home or around others for 5 days after leaving isolation.  Your household contacts should be tested as well as work contacts. If you feel worse or have increasing shortness of breath, you should be seen in person at urgent care or the emergency room.        Relevant Medications   molnupiravir EUA (LAGEVRIO) 200 mg CAPS capsule     Other   Positive self-administered antigen test for COVID-19   Time:  Today, I have spent 10 minutes with the patient with telehealth technology discussing the above problems.    Follow Up:  In Person prn    I,Lauren M Auman,acting as a scribe for Rochel Brome, MD.,have documented all relevant documentation on the behalf of Rochel Brome, MD,as directed by  Rochel Brome, MD while in the presence of Rochel Brome, MD.   Rochel Brome, MD

## 2021-02-18 ENCOUNTER — Encounter: Payer: Self-pay | Admitting: Family Medicine

## 2021-02-18 ENCOUNTER — Telehealth (INDEPENDENT_AMBULATORY_CARE_PROVIDER_SITE_OTHER): Payer: Medicare Other | Admitting: Family Medicine

## 2021-02-18 VITALS — BP 128/72 | Temp 98.2°F | Ht 63.0 in

## 2021-02-18 DIAGNOSIS — U071 COVID-19: Secondary | ICD-10-CM | POA: Diagnosis not present

## 2021-02-18 DIAGNOSIS — J069 Acute upper respiratory infection, unspecified: Secondary | ICD-10-CM | POA: Diagnosis not present

## 2021-02-18 MED ORDER — MOLNUPIRAVIR EUA 200MG CAPSULE: 4.0000 | ORAL_CAPSULE | Freq: Two times a day (BID) | ORAL | 0 refills | Status: AC

## 2021-02-18 NOTE — Patient Instructions (Signed)
Continue symptomatic treatment with tylenol, rest, fluids, and mucinex.   You should remain isolated and quarantine for at least 5 days from start of symptoms. You must be feeling better and be fever free without any fever reducers for at least 24 hours as well. You should wear a mask at all times when out of your home or around others for 5 days after leaving isolation.  Your household contacts should be tested as well as work contacts. If you feel worse or have increasing shortness of breath, you should be seen in person at urgent care or the emergency room.

## 2021-02-18 NOTE — Assessment & Plan Note (Signed)
Continue symptomatic treatment with tylenol, rest, fluids, and mucinex.  Molnupirivir sent.   You should remain isolated and quarantine for at least 5 days from start of symptoms. You must be feeling better and be fever free without any fever reducers for at least 24 hours as well. You should wear a mask at all times when out of your home or around others for 5 days after leaving isolation.  Your household contacts should be tested as well as work contacts. If you feel worse or have increasing shortness of breath, you should be seen in person at urgent care or the emergency room.

## 2021-02-20 ENCOUNTER — Ambulatory Visit (INDEPENDENT_AMBULATORY_CARE_PROVIDER_SITE_OTHER): Payer: Medicare Other

## 2021-02-20 DIAGNOSIS — I6300 Cerebral infarction due to thrombosis of unspecified precerebral artery: Secondary | ICD-10-CM | POA: Diagnosis not present

## 2021-02-22 HISTORY — PX: MOHS SURGERY: SHX181

## 2021-03-04 NOTE — Progress Notes (Signed)
Carelink Summary Report / Loop Recorder 

## 2021-03-10 ENCOUNTER — Other Ambulatory Visit: Payer: Self-pay | Admitting: Physician Assistant

## 2021-03-13 DIAGNOSIS — N1832 Chronic kidney disease, stage 3b: Secondary | ICD-10-CM | POA: Diagnosis not present

## 2021-03-16 ENCOUNTER — Telehealth: Payer: Self-pay

## 2021-03-16 NOTE — Telephone Encounter (Signed)
I called Claudia Rasmussen to let her know that she is due for her Prolia injection.  She is unsure if she wants to continue it and wants to speak with her provider first.  She is scheduled with Gay Filler in March - advised to not wait that long.

## 2021-03-17 DIAGNOSIS — N183 Chronic kidney disease, stage 3 unspecified: Secondary | ICD-10-CM | POA: Diagnosis not present

## 2021-03-17 DIAGNOSIS — N1832 Chronic kidney disease, stage 3b: Secondary | ICD-10-CM | POA: Diagnosis not present

## 2021-03-17 DIAGNOSIS — I129 Hypertensive chronic kidney disease with stage 1 through stage 4 chronic kidney disease, or unspecified chronic kidney disease: Secondary | ICD-10-CM | POA: Diagnosis not present

## 2021-03-30 ENCOUNTER — Ambulatory Visit (INDEPENDENT_AMBULATORY_CARE_PROVIDER_SITE_OTHER): Payer: Medicare Other

## 2021-03-30 DIAGNOSIS — Z8673 Personal history of transient ischemic attack (TIA), and cerebral infarction without residual deficits: Secondary | ICD-10-CM

## 2021-03-30 LAB — CUP PACEART REMOTE DEVICE CHECK
Date Time Interrogation Session: 20230205231458
Implantable Pulse Generator Implant Date: 20220708

## 2021-04-02 NOTE — Progress Notes (Signed)
Carelink Summary Report / Loop Recorder 

## 2021-04-23 DIAGNOSIS — C44319 Basal cell carcinoma of skin of other parts of face: Secondary | ICD-10-CM | POA: Diagnosis not present

## 2021-04-23 DIAGNOSIS — L570 Actinic keratosis: Secondary | ICD-10-CM | POA: Insufficient documentation

## 2021-04-23 DIAGNOSIS — L821 Other seborrheic keratosis: Secondary | ICD-10-CM | POA: Insufficient documentation

## 2021-04-23 DIAGNOSIS — D489 Neoplasm of uncertain behavior, unspecified: Secondary | ICD-10-CM | POA: Insufficient documentation

## 2021-04-24 ENCOUNTER — Other Ambulatory Visit: Payer: Self-pay | Admitting: *Deleted

## 2021-04-24 NOTE — Patient Outreach (Signed)
[facility]) Care Management ? ?04/24/2021 ? ?Ardeen Garland ?[birth year] ?[phone] ? ? ?RN Health Coach attempted follow up outreach call to patient.  Patient was unavailable. HIPPA compliance voicemail message left with return callback number. ? ?Plan: ?RN will call patient again within 30 days. ? ?Johny Shock BSN RN ?[place] [facility] ?[phone] ? ?

## 2021-04-27 NOTE — Patient Instructions (Signed)
Visit Information ? ?Thank you for taking time to visit with me today. Please don't hesitate to contact me if I can be of assistance to you before our next scheduled telephone appointment. ? ?Following are the goals we discussed today:  ?Current Barriers:  ?Knowledge Deficits related to plan of care for management of HTN  ? ?RNCM Clinical Goal(s):  ?Patient will verbalize understanding of plan for management of HTN as evidenced by continuation of monitoring blood pressure and adhering to low sodium diet through collaboration with RN Care manager, provider, and care team.  ? ?Interventions: ?Inter-disciplinary care team collaboration (see longitudinal plan of care) ?Evaluation of current treatment plan related to  self management and patient's adherence to plan as established by provider ?Patient will look into exercise routine at her independent facility ?Provided F.A.S.T. acronym sheet for stroke ?Provided Educational material on Low potassium diet ?Provided educational material on Renal diet ? ? ? ?Patient Goals/Self-Care Activities: ?Take medications as prescribed   ?Attend all scheduled provider appointments ?Call pharmacy for medication refills 3-7 days in advance of running out of medications ?Attend church or other social activities ?Perform all self care activities independently  ?Perform IADL's (shopping, preparing meals, housekeeping, managing finances) independently ?Call provider office for new concerns or questions  ?call the Suicide and Crisis Lifeline: 988 if experiencing a Mental Health or [place]  ?check blood pressure weekly ?choose a place to take my blood pressure (home, clinic or office, retail store) ?call doctor for signs and symptoms of high blood pressure ?keep all doctor appointments ?take medications for blood pressure exactly as prescribed ?begin an exercise program ?limit salt intake to '2300mg'$ /day ?renal diet  and decrease potassium ?  ?[id number] Patient blood pressure is  better controlled. The independent living facility she is in helps her monitor it. She is taking her medications as per ordered. Per patient her appetite is good. She is trying to maintain her her diet.She has not started her exercise routine. Patient stated that the facility has several classes. Per patient she will start exercise routine by next outreach. ? ?Our next appointment is by telephone on 07/28/2021 ? ?Please call Johny Shock RN at [phone] if you need to cancel or reschedule your appointment.  ? ?Please call the Suicide and Crisis Lifeline: 988 if you are experiencing a Mental Health or [place] or need someone to talk to. ? ?The patient verbalized understanding of instructions, educational materials, and care plan provided today and agreed to receive a mailed copy of patient instructions, educational materials, and care plan.  ? ?Telephone follow up appointment with care management team member scheduled for: ?The patient has been provided with contact information for the care management team and has been advised to call with any health related questions or concerns.  ? ?SIGNATURE ? ?Johny Shock BSN RN ?[place] [facility] ?[phone] ? ? ? ?  ?

## 2021-05-04 ENCOUNTER — Ambulatory Visit (INDEPENDENT_AMBULATORY_CARE_PROVIDER_SITE_OTHER): Payer: Medicare Other

## 2021-05-04 DIAGNOSIS — Z8673 Personal history of transient ischemic attack (TIA), and cerebral infarction without residual deficits: Secondary | ICD-10-CM

## 2021-05-05 LAB — CUP PACEART REMOTE DEVICE CHECK
Date Time Interrogation Session: 20230312231225
Implantable Pulse Generator Implant Date: 20220708

## 2021-05-13 ENCOUNTER — Encounter: Payer: Self-pay | Admitting: Physician Assistant

## 2021-05-13 ENCOUNTER — Other Ambulatory Visit: Payer: Self-pay

## 2021-05-13 ENCOUNTER — Ambulatory Visit (INDEPENDENT_AMBULATORY_CARE_PROVIDER_SITE_OTHER): Payer: Medicare Other | Admitting: Physician Assistant

## 2021-05-13 VITALS — BP 128/78 | HR 78 | Temp 96.8°F | Resp 16 | Ht 63.0 in | Wt 133.6 lb

## 2021-05-13 DIAGNOSIS — E038 Other specified hypothyroidism: Secondary | ICD-10-CM | POA: Diagnosis not present

## 2021-05-13 DIAGNOSIS — E559 Vitamin D deficiency, unspecified: Secondary | ICD-10-CM

## 2021-05-13 DIAGNOSIS — M818 Other osteoporosis without current pathological fracture: Secondary | ICD-10-CM | POA: Diagnosis not present

## 2021-05-13 DIAGNOSIS — I10 Essential (primary) hypertension: Secondary | ICD-10-CM

## 2021-05-13 DIAGNOSIS — E782 Mixed hyperlipidemia: Secondary | ICD-10-CM

## 2021-05-13 NOTE — Progress Notes (Signed)
? ?Established Patient Office Visit ? ?Subjective:  ?Patient ID: Claudia Rasmussen, female    DOB: [birth year]  Age: 88 y.o. MRN: [id number] ? ?CC:  ?Chief Complaint  ?Patient presents with  ? Hypertension  ? Hyperlipidemia  ? ? ?HPI ?Claudia Rasmussen presents for follow up hypothyroidism ? ?Claudia Rasmussen presents with atrophy of thyroid (acquired).  Date of diagnosis 2002.  She is currently taking Synthroid, 75 mcg daily. Is due for labwork today ?   ?Pt presents with hyperlipidemia.  Date of diagnosis 09/2009.  Current treatment includes lipitor 37m qd Compliance with treatment has been good; she takes her medication as directed, maintains her low cholesterol diet, follows up as directed, and maintains her exercise regimen.  She denies experiencing any hypercholesterolemia related symptoms ? ?Dx with age-related osteoporosis without current pathological fracture; this has been a problem for the past several years.  She describes the intensity of arthralgias as moderate.She is currently on Prolia injections but skipped her last dose - would like to get dexa scan to see how much it has helped her ?   ?Pt with history of Chronic Kidney disease - is following with nephrology now on a every 6 month basis- states everything has been stable recently -  ? 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 ? ?Past Medical History:  ?Diagnosis Date  ? Age-related osteoporosis without current pathological fracture   ? Arthritis   ? Atrophy of thyroid (acquired)   ? Benign hypertension 09/09/2020  ? Blepharitis 2021  ? Cholelithiases   ? Clostridium difficile infection   ? Diverticulitis   ? Diverticulitis of large intestine without perforation or abscess without bleeding   ? Family history of colon cancer   ? Migraine without aura, not intractable, without status migrainosus   ? Mixed hyperlipidemia   ? Stroke ([facility]   ? Thyroid disease   ? UTI (urinary tract infection)    ? Vaginal prolapse   ? Vitamin D deficiency, unspecified   ? ? ?Past Surgical History:  ?Procedure Laterality Date  ? ABDOMINAL HYSTERECTOMY    ? APPENDECTOMY  1969  ? CATARACT EXTRACTION, BILATERAL Bilateral   ? right- 03/22/2018 and left 05/10/2018  ? COLONOSCOPY  05/12/2009  ? Colonic polyp status post polypectomy. Moderate predominanltly left colonic diverticulosis. Internal hemorrhoids  ? LEFT HEART CATH AND CORONARY ANGIOGRAPHY N/A 10/14/2020  ? Procedure: LEFT HEART CATH AND CORONARY ANGIOGRAPHY;  Surgeon: VJettie Booze MD;  [facility];  Service: Cardiovascular;  Laterality: N/A;  ? LOOP RECORDER INSERTION N/A 08/29/2020  ? Procedure: LOOP RECORDER INSERTION;  Surgeon: LVickie Epley MD;  [place] [facility];  Service: Cardiovascular;  Laterality: N/A;  ? OOPHORECTOMY Left   ? ? ?Family History  ?Problem Relation Age of Onset  ? Breast cancer Mother   ? Colon cancer Father   ? Prostate cancer Brother   ? Coronary artery disease Other   ? Hypertension Other   ? Esophageal cancer Neg Hx   ? Rectal cancer Neg Hx   ? Stomach cancer Neg Hx   ? ? ?Social History  ? ?Socioeconomic History  ? Marital status: Widowed  ?  Spouse name: Not on file  ? Number of children: 3  ? Years of education: Not on file  ? Highest education level: Not on file  ?Occupational History  ? Not on file  ?Tobacco Use  ?  Smoking status: Former  ? Smokeless tobacco: Never  ?Vaping Use  ? Vaping Use: Never used  ?Substance and Sexual Activity  ? Alcohol use: Yes  ?  Comment: Drinks alcohol very infrequently. She typically consumes white wine.  ? Drug use: Never  ? Sexual activity: Not on file  ?Other Topics Concern  ? Not on file  ?Social History Narrative  ? Not on file  ? ?Social Determinants of Health  ? ?Financial Resource Strain: Not on file  ?Food Insecurity: No Food Insecurity  ? Worried About Charity fundraiser in the Last Year: Never true  ? Ran Out of Food in the Last Year: Never true   ?Transportation Needs: No Transportation Needs  ? Lack of Transportation (Medical): No  ? Lack of Transportation (Non-Medical): No  ?Physical Activity: Not on file  ?Stress: Not on file  ?Social Connections: Not on file  ?Intimate Partner Violence: Not on file  ? ? ? ?Current Outpatient Medications:  ?  atorvastatin (LIPITOR) 40 MG tablet, TAKE 1 TABLET DAILY, Disp: 90 tablet, Rfl: 3 ?  cetirizine (ZYRTEC) 10 MG tablet, Take 1 tablet (10 mg total) by mouth at bedtime., Disp: 90 tablet, Rfl: 3 ?  Cholecalciferol (VITAMIN D3) 25 MCG (1000 UT) CAPS, Take 1,000 Units by mouth in the morning., Disp: , Rfl:  ?  clopidogrel (PLAVIX) 75 MG tablet, TAKE 1 TABLET DAILY, Disp: 90 tablet, Rfl: 3 ?  levothyroxine (SYNTHROID) 75 MCG tablet, TAKE 1 TABLET DAILY, Disp: 90 tablet, Rfl: 3 ?  nebivolol (BYSTOLIC) 2.5 MG tablet, Take 1 tablet (2.5 mg total) by mouth daily., Disp: 90 tablet, Rfl: 3 ?  Polyvinyl Alcohol-Povidone (REFRESH OP), Place 1 drop into both eyes in the morning and at bedtime., Disp: , Rfl:  ?  Probiotic Product (PROBIOTIC-10 PO), Take 1 capsule by mouth daily., Disp: , Rfl:  ?  denosumab (PROLIA) 60 MG/ML SOSY injection, Inject 60 mg into the skin every 6 (six) months., Disp: , Rfl:  ?  nitroGLYCERIN (NITROSTAT) 0.4 MG SL tablet, Place 1 tablet (0.4 mg total) under the tongue every 5 (five) minutes as needed for chest pain. (Patient not taking: Reported on 05/13/2021), Disp: 90 tablet, Rfl: 3 ? ? ?Allergies  ?Allergen Reactions  ? Shellfish Allergy Other (See Comments)  ?  Itching, Hives ?Itching, Hives  ? Cephalosporins Other (See Comments)  ?  Itching   ? Levaquin [Levofloxacin]   ? Nitrofuran Derivatives Other (See Comments)  ?  Made her not feel good, caused tremors, increased creatnine  ? Azithromycin Rash  ? Moxifloxacin Hcl Rash  ? Phenobarbital Rash  ? ?CONSTITUTIONAL: Negative for chills, fatigue, fever, unintentional weight gain and unintentional weight loss.  ?E/N/T: Negative for ear pain, nasal  congestion and sore throat.  ?CARDIOVASCULAR: Negative for chest pain, dizziness, palpitations and pedal edema.  ?RESPIRATORY: Negative for recent cough and dyspnea.  ?GASTROINTESTINAL: Negative for abdominal pain, acid reflux symptoms, constipation, diarrhea, nausea and vomiting.  ?MSK: Negative for arthralgias and myalgias.  ?INTEGUMENTARY: Negative for rash.  ?NEUROLOGICAL: Negative for dizziness and headaches.  ?PSYCHIATRIC: Negative for sleep disturbance and to question depression screen.  Negative for depression, negative for anhedonia.  ?   ? ?  ?Objective:  ?PHYSICAL EXAM:  ? ?VS: BP 128/78   Pulse 78   Temp (!) 96.8 ?F (36 ?C)   Resp 16   Ht '5\' 3"'  (1.6 m)   Wt 133 lb 9.6 oz (60.6 kg)   BMI 23.67 kg/m?  ? ?GEN:  Well nourished, well developed, in no acute distress  ?Neck: no JVD or masses - no thyromegaly ?Cardiac: RRR; no murmurs, rubs, or gallops,no edema - ?Respiratory:  normal respiratory rate and pattern with no distress - normal breath sounds with no rales, rhonchi, wheezes or rubs ?Skin: warm and dry, no rash  ? ?Psych: euthymic mood, appropriate affect and demeanor ? ?There are no preventive care reminders to display for this patient. ? ? ?There are no preventive care reminders to display for this patient. ? ?Lab Results  ?Component Value Date  ? TSH 2.640 01/13/2021  ? ?Lab Results  ?Component Value Date  ? WBC 5.9 01/13/2021  ? HGB 12.4 01/13/2021  ? HCT 38.5 01/13/2021  ? MCV 94 01/13/2021  ? PLT 188 01/13/2021  ? ?Lab Results  ?Component Value Date  ? NA 140 01/13/2021  ? K 5.3 (H) 01/13/2021  ? CO2 19 (L) 01/13/2021  ? GLUCOSE 92 01/13/2021  ? BUN 38 (H) 01/13/2021  ? CREATININE 1.97 (H) 01/13/2021  ? BILITOT 0.9 01/13/2021  ? ALKPHOS 146 (H) 01/13/2021  ? AST 31 01/13/2021  ? ALT 28 01/13/2021  ? PROT 7.0 01/13/2021  ? ALBUMIN 4.2 01/13/2021  ? CALCIUM 9.2 01/13/2021  ? ANIONGAP 5 10/14/2020  ? EGFR 24 (L) 01/13/2021  ? ?Lab Results  ?Component Value Date  ? CHOL 145 01/13/2021  ? ?Lab  Results  ?Component Value Date  ? HDL 62 01/13/2021  ? ?Lab Results  ?Component Value Date  ? [place] 71 01/13/2021  ? ?Lab Results  ?Component Value Date  ? TRIG 60 01/13/2021  ? ?Lab Results  ?Component Value Date  ? CHOLHD

## 2021-05-14 LAB — CBC WITH DIFFERENTIAL/PLATELET
Basophils Absolute: 0.1 10*3/uL (ref 0.0–0.2)
Basos: 1 %
EOS (ABSOLUTE): 0.2 10*3/uL (ref 0.0–0.4)
Eos: 3 %
Hematocrit: 38.1 % (ref 34.0–46.6)
Hemoglobin: 13.2 g/dL (ref 11.1–15.9)
Immature Grans (Abs): 0.1 10*3/uL (ref 0.0–0.1)
Immature Granulocytes: 1 %
Lymphocytes Absolute: 1.5 10*3/uL (ref 0.7–3.1)
Lymphs: 26 %
MCH: 31.2 pg (ref 26.6–33.0)
MCHC: 34.6 g/dL (ref 31.5–35.7)
MCV: 90 fL (ref 79–97)
Monocytes Absolute: 0.5 10*3/uL (ref 0.1–0.9)
Monocytes: 9 %
Neutrophils Absolute: 3.4 10*3/uL (ref 1.4–7.0)
Neutrophils: 60 %
Platelets: 181 10*3/uL (ref 150–450)
RBC: 4.23 x10E6/uL (ref 3.77–5.28)
RDW: 13.1 % (ref 11.7–15.4)
WBC: 5.8 10*3/uL (ref 3.4–10.8)

## 2021-05-14 LAB — COMPREHENSIVE METABOLIC PANEL
ALT: 27 IU/L (ref 0–32)
AST: 24 IU/L (ref 0–40)
Albumin/Globulin Ratio: 1.6 (ref 1.2–2.2)
Albumin: 4.2 g/dL (ref 3.6–4.6)
Alkaline Phosphatase: 97 IU/L (ref 44–121)
BUN/Creatinine Ratio: 18 (ref 12–28)
BUN: 30 mg/dL — ABNORMAL HIGH (ref 8–27)
Bilirubin Total: 0.9 mg/dL (ref 0.0–1.2)
CO2: 21 mmol/L (ref 20–29)
Calcium: 9.8 mg/dL (ref 8.7–10.3)
Chloride: 106 mmol/L (ref 96–106)
Creatinine, Ser: 1.63 mg/dL — ABNORMAL HIGH (ref 0.57–1.00)
Globulin, Total: 2.6 g/dL (ref 1.5–4.5)
Glucose: 92 mg/dL (ref 70–99)
Potassium: 5 mmol/L (ref 3.5–5.2)
Sodium: 141 mmol/L (ref 134–144)
Total Protein: 6.8 g/dL (ref 6.0–8.5)
eGFR: 30 mL/min/{1.73_m2} — ABNORMAL LOW (ref >59)

## 2021-05-14 LAB — LIPID PANEL
Chol/HDL Ratio: 2.3 ratio (ref 0.0–4.4)
Cholesterol, Total: 161 mg/dL (ref 100–199)
HDL: 70 mg/dL (ref >39)
LDL Chol Calc (NIH): 80 mg/dL (ref 0–99)
Triglycerides: 56 mg/dL (ref 0–149)
VLDL Cholesterol Cal: 11 mg/dL (ref 5–40)

## 2021-05-14 LAB — TSH: TSH: 3.33 u[IU]/mL (ref 0.450–4.500)

## 2021-05-14 LAB — CARDIOVASCULAR RISK ASSESSMENT

## 2021-05-14 LAB — VITAMIN D 25 HYDROXY (VIT D DEFICIENCY, FRACTURES): Vit D, 25-Hydroxy: 46.7 ng/mL (ref 30.0–100.0)

## 2021-05-18 NOTE — Progress Notes (Signed)
Carelink Summary Report / Loop Recorder 

## 2021-05-19 ENCOUNTER — Encounter: Payer: Self-pay | Admitting: Physician Assistant

## 2021-06-02 DIAGNOSIS — N390 Urinary tract infection, site not specified: Secondary | ICD-10-CM | POA: Diagnosis not present

## 2021-06-02 DIAGNOSIS — R339 Retention of urine, unspecified: Secondary | ICD-10-CM | POA: Diagnosis not present

## 2021-06-02 DIAGNOSIS — N8110 Cystocele, unspecified: Secondary | ICD-10-CM | POA: Diagnosis not present

## 2021-06-08 ENCOUNTER — Ambulatory Visit (INDEPENDENT_AMBULATORY_CARE_PROVIDER_SITE_OTHER): Payer: Medicare Other

## 2021-06-08 DIAGNOSIS — Z8673 Personal history of transient ischemic attack (TIA), and cerebral infarction without residual deficits: Secondary | ICD-10-CM

## 2021-06-09 LAB — CUP PACEART REMOTE DEVICE CHECK
Date Time Interrogation Session: 20230414230648
Implantable Pulse Generator Implant Date: 20220708

## 2021-06-25 NOTE — Progress Notes (Signed)
Carelink Summary Report / Loop Recorder 

## 2021-07-08 DIAGNOSIS — C44319 Basal cell carcinoma of skin of other parts of face: Secondary | ICD-10-CM | POA: Diagnosis not present

## 2021-07-09 DIAGNOSIS — H18523 Epithelial (juvenile) corneal dystrophy, bilateral: Secondary | ICD-10-CM | POA: Diagnosis not present

## 2021-07-09 DIAGNOSIS — H5203 Hypermetropia, bilateral: Secondary | ICD-10-CM | POA: Diagnosis not present

## 2021-07-09 DIAGNOSIS — H524 Presbyopia: Secondary | ICD-10-CM | POA: Diagnosis not present

## 2021-07-09 DIAGNOSIS — H43393 Other vitreous opacities, bilateral: Secondary | ICD-10-CM | POA: Diagnosis not present

## 2021-07-09 DIAGNOSIS — H52203 Unspecified astigmatism, bilateral: Secondary | ICD-10-CM | POA: Diagnosis not present

## 2021-07-09 DIAGNOSIS — H16223 Keratoconjunctivitis sicca, not specified as Sjogren's, bilateral: Secondary | ICD-10-CM | POA: Diagnosis not present

## 2021-07-10 LAB — CUP PACEART REMOTE DEVICE CHECK
Date Time Interrogation Session: 20230517231224
Implantable Pulse Generator Implant Date: 20220708

## 2021-07-13 ENCOUNTER — Ambulatory Visit (INDEPENDENT_AMBULATORY_CARE_PROVIDER_SITE_OTHER): Payer: Medicare Other

## 2021-07-13 DIAGNOSIS — Z8673 Personal history of transient ischemic attack (TIA), and cerebral infarction without residual deficits: Secondary | ICD-10-CM | POA: Diagnosis not present

## 2021-07-14 DIAGNOSIS — M810 Age-related osteoporosis without current pathological fracture: Secondary | ICD-10-CM | POA: Diagnosis not present

## 2021-07-15 DIAGNOSIS — Z23 Encounter for immunization: Secondary | ICD-10-CM | POA: Diagnosis not present

## 2021-07-27 ENCOUNTER — Ambulatory Visit: Payer: Medicare Other | Admitting: *Deleted

## 2021-07-28 ENCOUNTER — Other Ambulatory Visit: Payer: Self-pay | Admitting: *Deleted

## 2021-07-28 NOTE — Patient Outreach (Signed)
[name]) Care Management [place] Note   07/28/2021 Name:  Claudia Rasmussen MRN:  [id number] DOB:  04/29/1931  Summary: Patient blood pressure has been stable.  Her blood pressure has been @ 130/80. She is not having any dizziness or swelling. Her appetite is good. Per patient she has been walking 5000 steps a day and monitor with a pedometer. Per patient she is not having any pain. She has not had any recent falls.  Patient will continue to exercise by walking.  Patient will continue to monitor the sodium in her diet.  Recommendations/Changes made from today's visit: Continue walking and counting steps Adhere to low sodium diet Medication adherence    Subjective: Claudia Rasmussen is an 88 y.o. year old female who is a primary patient of Marge Duncans, [place]. The care management team was consulted for assistance with care management and/or care coordination needs.    RN Health Coach completed Telephone Visit today.   Objective:  Medications Reviewed Today     Reviewed by Eliot Ford (Physician Assistant) on 05/13/21 at 1031  Med List Status: <None>   Medication Order Taking? Sig Documenting Provider Last Dose Status Informant  atorvastatin (LIPITOR) 40 MG tablet [id number] Yes TAKE 1 TABLET DAILY Marge Duncans, [name] Taking Active   cetirizine (ZYRTEC) 10 MG tablet [id number] Yes Take 1 tablet (10 mg total) by mouth at bedtime. Cox, Kirsten, MD Taking Active   Cholecalciferol (VITAMIN D3) 25 MCG (1000 UT) CAPS [id number] Yes Take 1,000 Units by mouth in the morning. [provider] Taking Active Self  clopidogrel (PLAVIX) 75 MG tablet [id number] Yes TAKE 1 TABLET DAILY Marge Duncans, [name] Taking Active   denosumab (PROLIA) 60 MG/ML SOSY injection [id number]  Inject 60 mg into the skin every 6 (six) months. [provider]  Active Self  levothyroxine (SYNTHROID) 75 MCG tablet [id number] Yes TAKE 1 TABLET DAILY Cox, Kirsten, MD Taking Active    nebivolol (BYSTOLIC) 2.5 MG tablet [id number] Yes Take 1 tablet (2.5 mg total) by mouth daily. Marge Duncans, [name] Taking Active Self  nitroGLYCERIN (NITROSTAT) 0.4 MG SL tablet [id number] No Place 1 tablet (0.4 mg total) under the tongue every 5 (five) minutes as needed for chest pain.  Patient not taking: Reported on 05/13/2021   Berniece Salines, DO Not Taking Expired 01/07/21 2359 Self           Med Note Ala Dach   Tue Oct 14, 2020  7:38 AM) Never used as of 10/14/20.  Polyvinyl Alcohol-Povidone (REFRESH OP) [id number] Yes Place 1 drop into both eyes in the morning and at bedtime. [provider] Taking Active Self  Probiotic Product (PROBIOTIC-10 PO) [id number] Yes Take 1 capsule by mouth daily. [provider] Taking Active Self             SDOH:  (Social Determinants of Health) assessments and interventions performed:    Care Plan  Review of patient past medical history, allergies, medications, health status, including review of consultants reports, laboratory and other test data, was performed as part of comprehensive evaluation for care management services.   Care Plan : RN Care Manager Plan of Care  Updates made by Amaree Loisel, Eppie Gibson, RN since 07/28/2021 12:00 AM     Problem: Knowledge Deficit related to Hypertension and Care Coordination Needs   Priority: High     Long-Range Goal: Development Plan of Care for Management of Hypertension   Start Date: 01/23/2021  Expected End Date: 02/20/2022  Priority:  High  Note:   Current Barriers:  Knowledge Deficits related to plan of care for management of HTN   RNCM Clinical Goal(s):  Patient will verbalize understanding of plan for management of HTN as evidenced by continuation of monitoring blood pressure and adhering to low sodium diet  through collaboration with RN Care manager, provider, and care team.   Interventions: Inter-disciplinary care team collaboration (see longitudinal plan of care) Evaluation  of current treatment plan related to  self management and patient's adherence to plan as established by provider Patient will look into exercise routine at her independent facility Provided F.A.S.T. acronym sheet for stroke Provided Educational material on Low potassium diet Provided educational material on Renal diet    Patient Goals/Self-Care Activities: Take medications as prescribed   Attend all scheduled provider appointments Call pharmacy for medication refills 3-7 days in advance of running out of medications Attend church or other social activities Perform all self care activities independently  Perform IADL's (shopping, preparing meals, housekeeping, managing finances) independently Call provider office for new concerns or questions  call the Suicide and Crisis Lifeline: 988 if experiencing a Mental Health or [place]  check blood pressure weekly choose a place to take my blood pressure (home, clinic or office, retail store) call doctor for signs and symptoms of high blood pressure keep all doctor appointments take medications for blood pressure exactly as prescribed begin an exercise program limit salt intake to '2300mg'$ /day renal diet  and decrease potassium   [id number] Patient blood pressure is better controlled. The independent living facility she is in helps her monitor it. She is taking her medications as per ordered. Per patient her appetite is good. She is trying to maintain her her diet.She has not started her exercise routine. Patient stated that the facility has several classes. Per patient she will start exercise routine by next outreach.  [id number] Patient blood pressure has been stable.  Her blood pressure has been @ 130/80. She is not having any dizziness or swelling. Her appetite is good. Per patient she has been walking 5000 steps a day and monitor with a pedometer. Per patient she is not having any pain. She has not had any recent falls.  Patient  will continue to exercise by walking.  Patient will continue to monitor the sodium in her diet.       Plan: Telephone follow up appointment with care management team member scheduled for:  November 04, 2021 The patient has been provided with contact information for the care management team and has been advised to call with any health related questions or concerns.   [place] [facility] [phone]

## 2021-07-28 NOTE — Patient Instructions (Addendum)
Visit Information  Thank you for taking time to visit with me today. Please don't hesitate to contact me if I can be of assistance to you before our next scheduled telephone appointment.  Following are the goals we discussed today:  urrent Barriers:  Knowledge Deficits related to plan of care for management of HTN   RNCM Clinical Goal(s):  Patient will verbalize understanding of plan for management of HTN as evidenced by continuation of monitoring blood pressure and adhering to low sodium diet through collaboration with RN Care manager, provider, and care team.   Interventions: Inter-disciplinary care team collaboration (see longitudinal plan of care) Evaluation of current treatment plan related to  self management and patient's adherence to plan as established by provider Patient will look into exercise routine at her independent facility Provided F.A.S.T. acronym sheet for stroke Provided Educational material on Low potassium diet Provided educational material on Renal diet    Patient Goals/Self-Care Activities: Take medications as prescribed   Attend all scheduled provider appointments Call pharmacy for medication refills 3-7 days in advance of running out of medications Attend church or other social activities Perform all self care activities independently  Perform IADL's (shopping, preparing meals, housekeeping, managing finances) independently Call provider office for new concerns or questions  call the Suicide and Crisis Lifeline: 988 if experiencing a Mental Health or [place]  check blood pressure weekly choose a place to take my blood pressure (home, clinic or office, retail store) call doctor for signs and symptoms of high blood pressure keep all doctor appointments take medications for blood pressure exactly as prescribed begin an exercise program limit salt intake to '2300mg'$ /day renal diet  and decrease potassium   [id number] Patient blood pressure is  better controlled. The independent living facility she is in helps her monitor it. She is taking her medications as per ordered. Per patient her appetite is good. She is trying to maintain her her diet.She has not started her exercise routine. Patient stated that the facility has several classes. Per patient she will start exercise routine by next outreach. [id number] Patient blood pressure has been stable.  Her blood pressure has been @ 130/80. She is not having any dizziness or swelling. Her appetite is good. Per patient she has been walking 5000 steps a day and monitor with a pedometer. Per patient she is not having any pain. She has not had any recent falls.  Patient will continue to exercise by walking.  Patient will continue to monitor the sodium in her diet. Our next appointment is by telephone on November 03, 2021  Please call Johny Shock RN [phone] if you need to cancel or reschedule your appointment.   Please call the Suicide and Crisis Lifeline: 988 if you are experiencing a Mental Health or [place] or need someone to talk to.  The patient verbalized understanding of instructions, educational materials, and care plan provided today and agreed to receive a mailed copy of patient instructions, educational materials, and care plan.   Telephone follow up appointment with care management team member scheduled for: The patient has been provided with contact information for the care management team and has been advised to call with any health related questions or concerns.   [facility] [phone]

## 2021-07-29 NOTE — Progress Notes (Signed)
Carelink Summary Report / Loop Recorder 

## 2021-07-30 ENCOUNTER — Other Ambulatory Visit: Payer: Self-pay

## 2021-07-30 DIAGNOSIS — M818 Other osteoporosis without current pathological fracture: Secondary | ICD-10-CM

## 2021-07-30 LAB — DG BONE DENSITY

## 2021-08-17 ENCOUNTER — Ambulatory Visit (INDEPENDENT_AMBULATORY_CARE_PROVIDER_SITE_OTHER): Payer: Medicare Other

## 2021-08-17 DIAGNOSIS — Z8673 Personal history of transient ischemic attack (TIA), and cerebral infarction without residual deficits: Secondary | ICD-10-CM

## 2021-08-18 LAB — CUP PACEART REMOTE DEVICE CHECK
Date Time Interrogation Session: 20230619231056
Implantable Pulse Generator Implant Date: 20220708

## 2021-08-28 ENCOUNTER — Ambulatory Visit (INDEPENDENT_AMBULATORY_CARE_PROVIDER_SITE_OTHER): Payer: Medicare Other

## 2021-08-28 DIAGNOSIS — M818 Other osteoporosis without current pathological fracture: Secondary | ICD-10-CM | POA: Diagnosis not present

## 2021-08-28 MED ORDER — DENOSUMAB 60 MG/ML ~~LOC~~ SOSY
60.0000 mg | PREFILLED_SYRINGE | Freq: Once | SUBCUTANEOUS | Status: AC
  Administered 2021-08-28: 60 mg via SUBCUTANEOUS

## 2021-08-28 NOTE — Progress Notes (Signed)
Patient in office to restart prolia. No reaction previously with medication. Tolerated in Left Arm. Card given for next date of injection;02/2022  Harrell Lark 08/28/21 10:23 AM

## 2021-09-03 ENCOUNTER — Other Ambulatory Visit: Payer: Self-pay | Admitting: Physician Assistant

## 2021-09-03 DIAGNOSIS — I10 Essential (primary) hypertension: Secondary | ICD-10-CM

## 2021-09-10 DIAGNOSIS — N1832 Chronic kidney disease, stage 3b: Secondary | ICD-10-CM | POA: Diagnosis not present

## 2021-09-10 NOTE — Progress Notes (Signed)
Carelink Summary Report / Loop Recorder 

## 2021-09-14 ENCOUNTER — Ambulatory Visit: Payer: Self-pay | Admitting: *Deleted

## 2021-09-14 DIAGNOSIS — E039 Hypothyroidism, unspecified: Secondary | ICD-10-CM | POA: Diagnosis not present

## 2021-09-14 DIAGNOSIS — N390 Urinary tract infection, site not specified: Secondary | ICD-10-CM | POA: Diagnosis not present

## 2021-09-14 DIAGNOSIS — I129 Hypertensive chronic kidney disease with stage 1 through stage 4 chronic kidney disease, or unspecified chronic kidney disease: Secondary | ICD-10-CM | POA: Diagnosis not present

## 2021-09-14 DIAGNOSIS — N1832 Chronic kidney disease, stage 3b: Secondary | ICD-10-CM | POA: Diagnosis not present

## 2021-09-14 DIAGNOSIS — E785 Hyperlipidemia, unspecified: Secondary | ICD-10-CM | POA: Diagnosis not present

## 2021-09-14 NOTE — Patient Outreach (Signed)
[facility]) Care Management  09/14/2021  Fredrick Dray [birth year] [phone]   RN Health Coach telephone call to patient.  [employer] compliance verified. [id number] Patient blood pressure is still showing elevations. Per patient the range is 112/88-141/98. RN Health Coach will close case. RN will refer to Care Coordinator for follow up care.   Plan: RN Health Coach case closure Referred to Care Coordinator for continued follow up care  [facility] [phone]

## 2021-09-17 LAB — CUP PACEART REMOTE DEVICE CHECK
Date Time Interrogation Session: 20230722231023
Implantable Pulse Generator Implant Date: 20220708

## 2021-09-21 ENCOUNTER — Ambulatory Visit (INDEPENDENT_AMBULATORY_CARE_PROVIDER_SITE_OTHER): Payer: Medicare Other

## 2021-09-21 DIAGNOSIS — Z8673 Personal history of transient ischemic attack (TIA), and cerebral infarction without residual deficits: Secondary | ICD-10-CM | POA: Diagnosis not present

## 2021-10-05 ENCOUNTER — Telehealth: Payer: Self-pay

## 2021-10-05 DIAGNOSIS — N8110 Cystocele, unspecified: Secondary | ICD-10-CM | POA: Diagnosis not present

## 2021-10-05 DIAGNOSIS — J209 Acute bronchitis, unspecified: Secondary | ICD-10-CM | POA: Diagnosis not present

## 2021-10-05 DIAGNOSIS — N390 Urinary tract infection, site not specified: Secondary | ICD-10-CM | POA: Diagnosis not present

## 2021-10-05 DIAGNOSIS — R339 Retention of urine, unspecified: Secondary | ICD-10-CM | POA: Diagnosis not present

## 2021-10-05 NOTE — Telephone Encounter (Signed)
Nurse for Claudia Rasmussen from [facility] called stating that they wanted to ensure that the provider for this patient received patient's visit note from today. She states that [name] is very concerned because she heard a lot of chest congestion while the patient was there and wanted to make sure that the provider seen the note.

## 2021-10-05 NOTE — Telephone Encounter (Signed)
I see the office note - she put her on Bactrim for uti which should cover for respiratory symptoms as well and pt was advised per her note to contact our office for follow up

## 2021-10-06 ENCOUNTER — Other Ambulatory Visit: Payer: Self-pay

## 2021-10-06 ENCOUNTER — Emergency Department (HOSPITAL_BASED_OUTPATIENT_CLINIC_OR_DEPARTMENT_OTHER)
Admission: EM | Admit: 2021-10-06 | Discharge: 2021-10-06 | Disposition: A | Discharge status: Home / Self Care | Payer: Medicare Other | Attending: Emergency Medicine | Admitting: Emergency Medicine

## 2021-10-06 ENCOUNTER — Emergency Department (HOSPITAL_BASED_OUTPATIENT_CLINIC_OR_DEPARTMENT_OTHER): Payer: Medicare Other

## 2021-10-06 ENCOUNTER — Encounter (HOSPITAL_BASED_OUTPATIENT_CLINIC_OR_DEPARTMENT_OTHER): Payer: Self-pay | Admitting: Pharmacy Technician

## 2021-10-06 DIAGNOSIS — Z87891 Personal history of nicotine dependence: Secondary | ICD-10-CM | POA: Insufficient documentation

## 2021-10-06 DIAGNOSIS — J40 Bronchitis, not specified as acute or chronic: Secondary | ICD-10-CM

## 2021-10-06 DIAGNOSIS — R062 Wheezing: Secondary | ICD-10-CM | POA: Diagnosis not present

## 2021-10-06 DIAGNOSIS — R0602 Shortness of breath: Secondary | ICD-10-CM | POA: Diagnosis not present

## 2021-10-06 DIAGNOSIS — J439 Emphysema, unspecified: Secondary | ICD-10-CM | POA: Diagnosis not present

## 2021-10-06 DIAGNOSIS — R059 Cough, unspecified: Secondary | ICD-10-CM | POA: Diagnosis not present

## 2021-10-06 DIAGNOSIS — R0789 Other chest pain: Secondary | ICD-10-CM | POA: Diagnosis not present

## 2021-10-06 DIAGNOSIS — Z20822 Contact with and (suspected) exposure to covid-19: Secondary | ICD-10-CM | POA: Insufficient documentation

## 2021-10-06 LAB — BASIC METABOLIC PANEL
Anion gap: 9 (ref 5–15)
BUN: 31 mg/dL — ABNORMAL HIGH (ref 8–23)
CO2: 18 mmol/L — ABNORMAL LOW (ref 22–32)
Calcium: 8.9 mg/dL (ref 8.9–10.3)
Chloride: 110 mmol/L (ref 98–111)
Creatinine, Ser: 1.46 mg/dL — ABNORMAL HIGH (ref 0.44–1.00)
GFR, Estimated: 34 mL/min — ABNORMAL LOW (ref >60)
Glucose, Bld: 117 mg/dL — ABNORMAL HIGH (ref 70–99)
Potassium: 4.1 mmol/L (ref 3.5–5.1)
Sodium: 137 mmol/L (ref 135–145)

## 2021-10-06 LAB — RESP PANEL BY RT-PCR (FLU A&B, COVID) ARPGX2
Influenza A by PCR: NEGATIVE
Influenza B by PCR: NEGATIVE
SARS Coronavirus 2 by RT PCR: NEGATIVE

## 2021-10-06 LAB — CBC WITH DIFFERENTIAL/PLATELET
Abs Immature Granulocytes: 0.04 10*3/uL (ref 0.00–0.07)
Basophils Absolute: 0.1 10*3/uL (ref 0.0–0.1)
Basophils Relative: 1 %
Eosinophils Absolute: 0.3 10*3/uL (ref 0.0–0.5)
Eosinophils Relative: 4 %
HCT: 39.3 % (ref 36.0–46.0)
Hemoglobin: 13.2 g/dL (ref 12.0–15.0)
Immature Granulocytes: 1 %
Lymphocytes Relative: 23 %
Lymphs Abs: 1.5 10*3/uL (ref 0.7–4.0)
MCH: 31.7 pg (ref 26.0–34.0)
MCHC: 33.6 g/dL (ref 30.0–36.0)
MCV: 94.2 fL (ref 80.0–100.0)
Monocytes Absolute: 0.5 10*3/uL (ref 0.1–1.0)
Monocytes Relative: 8 %
Neutro Abs: 4 10*3/uL (ref 1.7–7.7)
Neutrophils Relative %: 63 %
Platelets: 219 10*3/uL (ref 150–400)
RBC: 4.17 MIL/uL (ref 3.87–5.11)
RDW: 13.8 % (ref 11.5–15.5)
WBC: 6.4 10*3/uL (ref 4.0–10.5)
nRBC: 0 % (ref 0.0–0.2)

## 2021-10-06 LAB — TROPONIN I (HIGH SENSITIVITY)
Troponin I (High Sensitivity): 6 ng/L (ref <18)
Troponin I (High Sensitivity): 6 ng/L (ref <18)

## 2021-10-06 MED ORDER — ALBUTEROL SULFATE HFA 108 (90 BASE) MCG/ACT IN AERS
2.0000 | INHALATION_SPRAY | RESPIRATORY_TRACT | Status: DC | PRN
  Administered 2021-10-06: 2 via RESPIRATORY_TRACT

## 2021-10-06 MED ORDER — ALBUTEROL SULFATE HFA 108 (90 BASE) MCG/ACT IN AERS
INHALATION_SPRAY | RESPIRATORY_TRACT | Status: AC
  Filled 2021-10-06: qty 6.7

## 2021-10-06 MED ORDER — IPRATROPIUM-ALBUTEROL 0.5-2.5 (3) MG/3ML IN SOLN
3.0000 mL | Freq: Once | RESPIRATORY_TRACT | Status: AC
Start: 2021-10-06 — End: 2021-10-06
  Administered 2021-10-06: 3 mL via RESPIRATORY_TRACT
  Filled 2021-10-06: qty 3

## 2021-10-06 MED ORDER — PREDNISONE 20 MG PO TABS: ORAL_TABLET | ORAL | 0 refills | Status: DC

## 2021-10-06 NOTE — Telephone Encounter (Signed)
Called patient and made her aware, patient stated she is having trouble breathing. Recommended that she go to the nearest ER.

## 2021-10-06 NOTE — ED Notes (Signed)
Discharge instructions reviewed with patient. Pt states understanding. Pt aware of prescription to pick up and begin. Pt ambulatory upon discharge with daughter

## 2021-10-06 NOTE — Discharge Instructions (Signed)
1.  The albuterol inhaler 2 puffs every 4-6 hours for the next 2 to 3 days.  After that you may use as needed. 2.  Start prednisone today. 3.  Your x-ray suggests emphysema.  This is a chronic lung condition.  Discuss this with your doctor.  Eczema can range from mild to severe.  Since you are only developing symptoms later in life, do not expect that this is going to advance significantly.  You may periodically suffer from episodes of bronchitis. 4.  Return to the emergency department if you have new or worsening symptoms such as fever, increasing chest pain or shortness of breath.

## 2021-10-06 NOTE — ED Triage Notes (Signed)
Pt reports cough, wheezing, shob, and chest tightness onset approx 10 days ago. Pt talking in complete sentences.

## 2021-10-06 NOTE — ED Provider Notes (Signed)
[facility] EMERGENCY DEPARTMENT Provider Note   CSN: [id number] Arrival date & time: 10/06/21  1009     History  Chief Complaint  Patient presents with   Cough   Shortness of Breath   Chest Pain    Claudia Rasmussen is a 88 y.o. female.  HPI She reports she has had shortness of breath and chest tightness for over a week.  She reports the tightness has been a constant feeling.  She is also been coughing and cough has been productive.  She reports she was seeing her urologist who noticed her coughing and difficulty breathing.  She reports that at that time there was wheezing present.  Patient denies any significant history of wheezing or using any inhalers at home.  She has not had any lower extremity swelling or calf pain.  No documented fever.  Patient has a distant smoking history of about 30 years from young adulthood to her 9s.  Non-smoker since that time.  Denies any formal diagnosis of emphysema or COPD.    Home Medications Prior to Admission medications   Medication Sig Start Date End Date Taking? Authorizing Provider  predniSONE (DELTASONE) 20 MG tablet 2 tabs po daily x 4 days 10/06/21  Yes Dondi Burandt, Jeannie Done, MD  atorvastatin (LIPITOR) 40 MG tablet TAKE 1 TABLET DAILY 03/10/21   Marge Duncans, [name]  cetirizine (ZYRTEC) 10 MG tablet Take 1 tablet (10 mg total) by mouth at bedtime. 12/28/20   CoxElnita Maxwell, MD  Cholecalciferol (VITAMIN D3) 25 MCG (1000 UT) CAPS Take 1,000 Units by mouth in the morning.    [provider]  clopidogrel (PLAVIX) 75 MG tablet TAKE 1 TABLET DAILY 02/04/21   Marge Duncans, [name]  denosumab (PROLIA) 60 MG/ML SOSY injection Inject 60 mg into the skin every 6 (six) months.    [provider]  levothyroxine (SYNTHROID) 75 MCG tablet TAKE 1 TABLET DAILY 02/17/21   Cox, Kirsten, MD  nebivolol (BYSTOLIC) 2.5 MG tablet TAKE 1 TABLET(2.5 MG) BY MOUTH DAILY 09/03/21   Marge Duncans, [name]  nitroGLYCERIN (NITROSTAT) 0.4 MG SL tablet Place 1 tablet  (0.4 mg total) under the tongue every 5 (five) minutes as needed for chest pain. Patient not taking: Reported on 05/13/2021 10/09/20 01/07/21  Tobb, Godfrey Pick, DO  Polyvinyl Alcohol-Povidone (REFRESH OP) Place 1 drop into both eyes in the morning and at bedtime.    [provider]  Probiotic Product (PROBIOTIC-10 PO) Take 1 capsule by mouth daily.    [provider]      Allergies    Shellfish allergy, Cephalosporins, Levaquin [levofloxacin], Nitrofuran derivatives, Azithromycin, Moxifloxacin hcl, and Phenobarbital    Review of Systems   Review of Systems 10 systems reviewed negative except as per HPI Physical Exam Updated Vital Signs BP 125/86   Pulse 66   Temp (!) 97.5 F (36.4 C)   Resp 17   SpO2 94%  Physical Exam Constitutional:      Comments: Well-developed.  Alert nontoxic.  No respiratory distress at rest.  Mental status clear.  HENT:     Mouth/Throat:     Pharynx: Oropharynx is clear.  Eyes:     Extraocular Movements: Extraocular movements intact.  Cardiovascular:     Rate and Rhythm: Normal rate and regular rhythm.  Pulmonary:     Comments: No respiratory distress at rest.  Frequent cough.  Occasional expiratory wheeze.  No rhonchi no rales.  Good air flow to the lung bases. Abdominal:     General: There is  no distension.     Palpations: Abdomen is soft.     Tenderness: There is no abdominal tenderness.  Musculoskeletal:        General: No swelling or tenderness. Normal range of motion.     Right lower leg: No edema.     Left lower leg: No edema.  Skin:    General: Skin is warm and dry.  Neurological:     General: No focal deficit present.     Mental Status: She is oriented to person, place, and time.     Motor: No weakness.     Coordination: Coordination normal.  Psychiatric:        Mood and Affect: Mood normal.     ED Results / Procedures / Treatments   Labs (all labs ordered are listed, but only abnormal results are displayed) Labs  Reviewed  BASIC METABOLIC PANEL - Abnormal; Notable for the following components:      Result Value   CO2 18 (*)    Glucose, Bld 117 (*)    BUN 31 (*)    Creatinine, Ser 1.46 (*)    GFR, Estimated 34 (*)    All other components within normal limits  RESP PANEL BY RT-PCR (FLU A&B, COVID) ARPGX2  CBC WITH DIFFERENTIAL/PLATELET  TROPONIN I (HIGH SENSITIVITY)  TROPONIN I (HIGH SENSITIVITY)    EKG EKG Interpretation  Date/Time:  Tuesday October 06 2021 10:20:59 EDT Ventricular Rate:  78 PR Interval:  177 QRS Duration: 90 QT Interval:  398 QTC Calculation: 454 R Axis:   58 Text Interpretation: Sinus rhythm normal, no sig change from previous Confirmed by Charlesetta Shanks [id number]) on 10/06/2021 11:31:47 AM  Radiology DG Chest Port 1 View  Result Date: 10/06/2021 CLINICAL DATA:  Cough, wheezing, shortness of breath, and chest tightness. EXAM: PORTABLE CHEST 1 VIEW COMPARISON:  Chest x-ray May 12, 2009. FINDINGS: The heart size and mediastinal contours are within normal limits. Both lungs are clear. No visible pleural effusions or pneumothorax. No acute osseous abnormality. Loop recorder projects at the lower left chest. Emphysematous changes. IMPRESSION: 1. No acute cardiopulmonary disease. 2. Chronic emphysematous changes. Electronically Signed   By: Margaretha Sheffield M.D.   On: 10/06/2021 11:07    Procedures Procedures    Medications Ordered in ED Medications  albuterol (VENTOLIN HFA) 108 (90 Base) MCG/ACT inhaler 2 puff (2 puffs Inhalation Not Given 10/06/21 1402)  ipratropium-albuterol (DUONEB) 0.5-2.5 (3) MG/3ML nebulizer solution 3 mL (3 mLs Nebulization Given 10/06/21 1037)    ED Course/ Medical Decision Making/ A&P                           Medical Decision Making Amount and/or Complexity of Data Reviewed Labs: ordered. Radiology: ordered.  Risk Prescription drug management.   Patient presents with chest tightness that has been constant for over a week, shortness of  breath and productive cough.  Differential diagnosis includes pneumonia\bronchitis\ACS\pulmonary embolus\CHF.  We will proceed with diagnostic evaluation including chest x-ray, EKG, troponin, CBC, BNP.  Will trial DuoNeb for response to treatment with is most suggestive of bronchitis and bronchospasm.  Patient reports subjective improvement after DuoNeb.  Clinically well in appearance.  EKG reviewed by myself no ischemic changes.  Unchanged from previous otherwise normal in appearance.  Chest x-ray reviewed by radiology and also visualized by myself.  Radiology review indicates some emphysematous appearance.  Grossly clear lung fields without consolidation per my visualization.  Troponin is normal.  No leukocytosis.  COVID swab negative.  Vital signs stable.  This time with negative work-up and positive response to DuoNeb, findings most consistent with bronchitis with probable mild underlying chronic pulmonary disease based on x-ray and distant proximately 30-year smoking history.  Patient advised that she has had C. difficile in the past particularly associated with Zithromax.  Is currently taking a prescription of Bactrim for UTI.  At this time will opt not to initiate antibiotics. Plan will be for 3 days of prednisone with albuterol inhaler to assess response to treatment.  Careful return precautions and follow-up plan reviewed.          Final Clinical Impression(s) / ED Diagnoses Final diagnoses:  Bronchitis    Rx / DC Orders ED Discharge Orders          Ordered    predniSONE (DELTASONE) 20 MG tablet        10/06/21 1406              Charlesetta Shanks, MD 10/06/21 1718

## 2021-10-22 NOTE — Progress Notes (Signed)
Carelink Summary Report / Loop Recorder 

## 2021-10-23 ENCOUNTER — Ambulatory Visit (INDEPENDENT_AMBULATORY_CARE_PROVIDER_SITE_OTHER): Payer: Medicare Other

## 2021-10-23 DIAGNOSIS — Z8673 Personal history of transient ischemic attack (TIA), and cerebral infarction without residual deficits: Secondary | ICD-10-CM

## 2021-10-26 LAB — CUP PACEART REMOTE DEVICE CHECK
Date Time Interrogation Session: 20230830232052
Implantable Pulse Generator Implant Date: 20220708

## 2021-10-27 DIAGNOSIS — L814 Other melanin hyperpigmentation: Secondary | ICD-10-CM | POA: Insufficient documentation

## 2021-10-27 DIAGNOSIS — L821 Other seborrheic keratosis: Secondary | ICD-10-CM | POA: Diagnosis not present

## 2021-10-27 DIAGNOSIS — Z85828 Personal history of other malignant neoplasm of skin: Secondary | ICD-10-CM | POA: Insufficient documentation

## 2021-10-27 DIAGNOSIS — D229 Melanocytic nevi, unspecified: Secondary | ICD-10-CM | POA: Insufficient documentation

## 2021-10-27 DIAGNOSIS — D1801 Hemangioma of skin and subcutaneous tissue: Secondary | ICD-10-CM | POA: Insufficient documentation

## 2021-10-27 DIAGNOSIS — L570 Actinic keratosis: Secondary | ICD-10-CM | POA: Diagnosis not present

## 2021-10-28 ENCOUNTER — Ambulatory Visit: Payer: Self-pay

## 2021-10-28 NOTE — Patient Outreach (Signed)
  Care Coordination   10/28/2021 Name: Claudia Rasmussen MRN: [id number] DOB: 09-Aug-1931   Care Coordination Outreach Attempts:  An unsuccessful telephone outreach was attempted for a scheduled appointment today.  Follow Up Plan:  Additional outreach attempts will be made to offer the patient care coordination information and services.   Encounter Outcome:  No Answer  Care Coordination Interventions Activated:  No   Care Coordination Interventions:  No, not indicated   Tomasa Rand, RN, BSN, CEN [facility] [employer] [phone]

## 2021-11-02 ENCOUNTER — Telehealth: Payer: Self-pay | Admitting: *Deleted

## 2021-11-02 NOTE — Chronic Care Management (AMB) (Unsigned)
  Care Coordination  Outreach Note  11/02/2021 Name: Claudia Rasmussen MRN: [id number] DOB: [birth year]   Care Coordination Outreach Attempts: An unsuccessful telephone outreach was attempted today to offer the patient information about available care coordination services as a benefit of their health plan.   Rescheduling   Follow Up Plan:  No further outreach attempts will be made at this time. We have been unable to contact the patient to offer or enroll patient in care coordination services  Encounter Outcome:  No Answer  Julian Hy, [place] Direct Dial: [phone]

## 2021-11-03 NOTE — Chronic Care Management (AMB) (Signed)
  Care Coordination   Note   11/03/2021 Name: Brendy Ficek MRN: [id number] DOB: 08/26/1931  Aletta Edmunds is a 88 y.o. year old female who sees Marge Duncans, [place] for primary care. I reached out to Ardeen Garland by phone today to offer care coordination services.  Ms. Leisure was given information about Care Coordination services today including:   The Care Coordination services include support from the care team which includes your Nurse Coordinator, Clinical Social Worker, or Pharmacist.  The Care Coordination team is here to help remove barriers to the health concerns and goals most important to you. Care Coordination services are voluntary, and the patient may decline or stop services at any time by request to their care team member.   Care Coordination Consent Status: Patient did not agree to participate in care coordination services at this time.  Follow up plan:  none - pt appreciative but declines to reschedule at this time  Encounter Outcome:  Pt. Refused

## 2021-11-10 ENCOUNTER — Ambulatory Visit: Payer: Medicare Other | Admitting: *Deleted

## 2021-11-11 NOTE — Progress Notes (Signed)
Carelink Summary Report / Loop Recorder 

## 2021-11-20 ENCOUNTER — Ambulatory Visit (INDEPENDENT_AMBULATORY_CARE_PROVIDER_SITE_OTHER): Payer: Medicare Other | Admitting: Physician Assistant

## 2021-11-20 ENCOUNTER — Encounter: Payer: Self-pay | Admitting: Physician Assistant

## 2021-11-20 VITALS — BP 120/78 | HR 72 | Temp 97.6°F | Ht 67.0 in | Wt 134.4 lb

## 2021-11-20 DIAGNOSIS — E038 Other specified hypothyroidism: Secondary | ICD-10-CM | POA: Diagnosis not present

## 2021-11-20 DIAGNOSIS — J438 Other emphysema: Secondary | ICD-10-CM

## 2021-11-20 DIAGNOSIS — E782 Mixed hyperlipidemia: Secondary | ICD-10-CM

## 2021-11-20 DIAGNOSIS — H6981 Other specified disorders of Eustachian tube, right ear: Secondary | ICD-10-CM | POA: Diagnosis not present

## 2021-11-20 DIAGNOSIS — M818 Other osteoporosis without current pathological fracture: Secondary | ICD-10-CM | POA: Diagnosis not present

## 2021-11-20 DIAGNOSIS — I10 Essential (primary) hypertension: Secondary | ICD-10-CM | POA: Diagnosis not present

## 2021-11-20 DIAGNOSIS — Z23 Encounter for immunization: Secondary | ICD-10-CM | POA: Diagnosis not present

## 2021-11-20 DIAGNOSIS — H6991 Unspecified Eustachian tube disorder, right ear: Secondary | ICD-10-CM

## 2021-11-20 DIAGNOSIS — E559 Vitamin D deficiency, unspecified: Secondary | ICD-10-CM

## 2021-11-20 MED ORDER — FLUTICASONE PROPIONATE 50 MCG/ACT NA SUSP: 2.0000 | Freq: Every day | NASAL | 6 refills | Status: DC

## 2021-11-20 MED ORDER — DOXYCYCLINE HYCLATE 100 MG PO TABS: 100.0000 mg | ORAL_TABLET | Freq: Two times a day (BID) | ORAL | 0 refills | Status: DC

## 2021-11-20 MED ORDER — FLUTICASONE-SALMETEROL 115-21 MCG/ACT IN AERO: 2.0000 | INHALATION_SPRAY | Freq: Two times a day (BID) | RESPIRATORY_TRACT | 12 refills | Status: DC

## 2021-11-20 NOTE — Progress Notes (Signed)
Established Patient Office Visit  Subjective:  Patient ID: Claudia Rasmussen, female    DOB: 08/23/1931  Age: 88 y.o. MRN: [id number]  CC:  Chief Complaint  Patient presents with   Hypertension    HPI Claudia Rasmussen presents for follow up hypothyroidism and chronic medical issues  Claudia Rasmussen presents with atrophy of thyroid (acquired).  Date of diagnosis 2002.  She is currently taking Synthroid, 75 mcg daily. Is due for labwork today    Pt presents with hyperlipidemia.  Date of diagnosis 09/2009.  Current treatment includes lipitor 63m qd Compliance with treatment has been good; she takes her medication as directed, maintains her low cholesterol diet, follows up as directed, and maintains her exercise regimen.  She denies experiencing any hypercholesterolemia related symptoms  Dx with age-related osteoporosis without current pathological fracture; this has been a problem for the past several years.  She describes the intensity of arthralgias as moderate.She is currently on Prolia injections   Pt with history of Chronic Kidney disease - is following with nephrology now on a every 6 month basis- states everything has been stable recently -   Pt states that she was seen in ED in mid August and diagnosed with bronchitis and emphysema - she was only prescribed a few days of prednisone  and had been on septra for uti  She continues to have cough and congestion and at times some wheezing She does have albuterol inhaler which is helping some She also complains of right ear stopped up .  Past Medical History:  Diagnosis Date   Age-related osteoporosis without current pathological fracture    Arthritis    Atrophy of thyroid (acquired)    Benign hypertension 09/09/2020   Blepharitis 2021   Cholelithiases    Clostridium difficile infection    Diverticulitis    Diverticulitis of large intestine without perforation or abscess without bleeding    Family history of colon cancer    Migraine  without aura, not intractable, without status migrainosus    Mixed hyperlipidemia    Stroke ([facility]    Thyroid disease    UTI (urinary tract infection)    Vaginal prolapse    Vitamin D deficiency, unspecified     Past Surgical History:  Procedure Laterality Date   ABDOMINAL HYSTERECTOMY     APPENDECTOMY  1969   CATARACT EXTRACTION, BILATERAL Bilateral    right- 03/22/2018 and left 05/10/2018   COLONOSCOPY  05/12/2009   Colonic polyp status post polypectomy. Moderate predominanltly left colonic diverticulosis. Internal hemorrhoids   LEFT HEART CATH AND CORONARY ANGIOGRAPHY N/A 10/14/2020   Procedure: LEFT HEART CATH AND CORONARY ANGIOGRAPHY;  Surgeon: VJettie Booze MD;  [facility];  Service: Cardiovascular;  Laterality: N/A;   LOOP RECORDER INSERTION N/A 08/29/2020   Procedure: LOOP RECORDER INSERTION;  Surgeon: LVickie Epley MD;  [facility];  Service: Cardiovascular;  Laterality: N/A;   OOPHORECTOMY Left     Family History  Problem Relation Age of Onset   Breast cancer Mother    Colon cancer Father    Prostate cancer Brother    Coronary artery disease Other    Hypertension Other    Esophageal cancer Neg Hx    Rectal cancer Neg Hx    Stomach cancer Neg Hx     Social History   Socioeconomic History   Marital status: Widowed    Spouse name: Not on file   Number of children: 3   Years of education: Not  on file   Highest education level: Not on file  Occupational History   Not on file  Tobacco Use   Smoking status: Former   Smokeless tobacco: Never  Vaping Use   Vaping Use: Never used  Substance and Sexual Activity   Alcohol use: Yes    Comment: Drinks alcohol very infrequently. She typically consumes white wine.   Drug use: Never   Sexual activity: Not on file  Other Topics Concern   Not on file  Social History Narrative   Not on file   Social Determinants of Health   Financial Resource Strain: Not on file  Food  Insecurity: No Food Insecurity (04/27/2021)   Hunger Vital Sign    Worried About Running Out of Food in the Last Year: Never true    Ran Out of Food in the Last Year: Never true  Transportation Needs: No Transportation Needs (04/27/2021)   PRAPARE - Hydrologist (Medical): No    Lack of Transportation (Non-Medical): No  Physical Activity: Not on file  Stress: Not on file  Social Connections: Not on file  Intimate Partner Violence: Not on file     Current Outpatient Medications:    atorvastatin (LIPITOR) 40 MG tablet, TAKE 1 TABLET DAILY, Disp: 90 tablet, Rfl: 3   cetirizine (ZYRTEC) 10 MG tablet, Take 1 tablet (10 mg total) by mouth at bedtime., Disp: 90 tablet, Rfl: 3   Cholecalciferol (VITAMIN D3) 25 MCG (1000 UT) CAPS, Take 1,000 Units by mouth in the morning., Disp: , Rfl:    clopidogrel (PLAVIX) 75 MG tablet, TAKE 1 TABLET DAILY, Disp: 90 tablet, Rfl: 3   denosumab (PROLIA) 60 MG/ML SOSY injection, Inject 60 mg into the skin every 6 (six) months., Disp: , Rfl:    doxycycline (VIBRA-TABS) 100 MG tablet, Take 1 tablet (100 mg total) by mouth 2 (two) times daily., Disp: 20 tablet, Rfl: 0   fluticasone (FLONASE) 50 MCG/ACT nasal spray, Place 2 sprays into both nostrils daily., Disp: 16 g, Rfl: 6   fluticasone-salmeterol (ADVAIR HFA) 115-21 MCG/ACT inhaler, Inhale 2 puffs into the lungs 2 (two) times daily., Disp: 1 each, Rfl: 12   levothyroxine (SYNTHROID) 75 MCG tablet, TAKE 1 TABLET DAILY, Disp: 90 tablet, Rfl: 3   MAGNESIUM OXIDE PO, Take by mouth., Disp: , Rfl:    Melatonin-Pyridoxine (MELATIN PO), Take by mouth., Disp: , Rfl:    nebivolol (BYSTOLIC) 2.5 MG tablet, TAKE 1 TABLET(2.5 MG) BY MOUTH DAILY, Disp: 90 tablet, Rfl: 3   Polyvinyl Alcohol-Povidone (REFRESH OP), Place 1 drop into both eyes in the morning and at bedtime., Disp: , Rfl:    Probiotic Product (PROBIOTIC-10 PO), Take 1 capsule by mouth daily., Disp: , Rfl:    nitroGLYCERIN (NITROSTAT) 0.4 MG  SL tablet, Place 1 tablet (0.4 mg total) under the tongue every 5 (five) minutes as needed for chest pain. (Patient not taking: Reported on 05/13/2021), Disp: 90 tablet, Rfl: 3   Allergies  Allergen Reactions   Shellfish Allergy Other (See Comments)    Itching, Hives Itching, Hives   Cephalosporins Other (See Comments)    Itching    Levaquin [Levofloxacin]    Nitrofuran Derivatives Other (See Comments)    Made her not feel good, caused tremors, increased creatnine   Azithromycin Rash   Moxifloxacin Hcl Rash   Phenobarbital Rash   CONSTITUTIONAL: Negative for chills, fatigue, fever, unintentional weight gain and unintentional weight loss.  E/N/T: see HPI CARDIOVASCULAR: Negative for chest pain,  dizziness, palpitations and pedal edema.  RESPIRATORY:see HPI GASTROINTESTINAL: Negative for abdominal pain, acid reflux symptoms, constipation, diarrhea, nausea and vomiting.  MSK: Negative for arthralgias and myalgias.  INTEGUMENTARY: Negative for rash.  NEUROLOGICAL: Negative for dizziness and headaches.  PSYCHIATRIC: Negative for sleep disturbance and to question depression screen.  Negative for depression, negative for anhedonia.            Objective:  PHYSICAL EXAM:   VS: BP 120/78 (BP Location: Left Arm, Patient Position: Sitting, Cuff Size: Normal)   Pulse 72   Temp 97.6 F (36.4 C) (Temporal)   Ht _0  (1.702 m)   Wt 134 lb 6.4 oz (61 kg)   SpO2 91%   BMI 21.05 kg/m   GEN: Well nourished, well developed, in no acute distress  HEENT: normal external ears and nose - normal external auditory canals and TMS but retracted on right - hearing grossly normal -  - Lips, Teeth and Gums - normal  Oropharynx - normal mucosa, palate, and posterior pharynx  Cardiac: RRR; no murmurs, rubs, or gallops,no edema -  Respiratory:  faint exp rhonchi noted Skin: warm and dry, no rash   Psych: euthymic mood, appropriate affect and demeanor   Health Maintenance Due  Topic Date Due    COVID-19 Vaccine (7 - Moderna risk series) 09/09/2021     There are no preventive care reminders to display for this patient.  Lab Results  Component Value Date   TSH 3.330 05/13/2021   Lab Results  Component Value Date   WBC 6.4 10/06/2021   HGB 13.2 10/06/2021   HCT 39.3 10/06/2021   MCV 94.2 10/06/2021   PLT 219 10/06/2021   Lab Results  Component Value Date   NA 137 10/06/2021   K 4.1 10/06/2021   CO2 18 (L) 10/06/2021   GLUCOSE 117 (H) 10/06/2021   BUN 31 (H) 10/06/2021   CREATININE 1.46 (H) 10/06/2021   BILITOT 0.9 05/13/2021   ALKPHOS 97 05/13/2021   AST 24 05/13/2021   ALT 27 05/13/2021   PROT 6.8 05/13/2021   ALBUMIN 4.2 05/13/2021   CALCIUM 8.9 10/06/2021   ANIONGAP 9 10/06/2021   EGFR 30 (L) 05/13/2021   Lab Results  Component Value Date   CHOL 161 05/13/2021   Lab Results  Component Value Date   HDL 70 05/13/2021   Lab Results  Component Value Date   LDLCALC 80 05/13/2021   Lab Results  Component Value Date   TRIG 56 05/13/2021   Lab Results  Component Value Date   CHOLHDL 2.3 05/13/2021   Lab Results  Component Value Date   HGBA1C 5.4 08/28/2020      Assessment & Plan:   Problem List Items Addressed This Visit       Endocrine   Other specified hypothyroidism - Primary   Relevant Orders   TSH Continue current meds     Genitourinary   Stage 3b chronic kidney disease ([place]) Continue follow up with nephrology     Other   Mixed hyperlipidemia   Relevant Orders   Lipid panel Continue meds   Vit D def Labwork pending  Emphysema Rx for advair  Bronchitis Rx for doxycycline  ETD Warm salt water gargles Flonase spray as directed  Need for flu vaccine Fluad given                         Meds ordered this encounter  Medications   doxycycline (VIBRA-TABS) 100  MG tablet    Sig: Take 1 tablet (100 mg total) by mouth 2 (two) times daily.    Dispense:  20 tablet    Refill:  0    Order Specific Question:    Supervising Provider    Answer:   COX, Lynder Parents   fluticasone (FLONASE) 50 MCG/ACT nasal spray    Sig: Place 2 sprays into both nostrils daily.    Dispense:  16 g    Refill:  6    Order Specific Question:   Supervising Provider    Answer:   Shelton Silvas   fluticasone-salmeterol (ADVAIR HFA) 115-21 MCG/ACT inhaler    Sig: Inhale 2 puffs into the lungs 2 (two) times daily.    Dispense:  1 each    Refill:  12    Order Specific Question:   Supervising Provider    AnswerRochel Brome [phone]     Follow-up: Return in about 4 weeks (around 12/18/2021) for follow up.    SARA R Jancie Kercher, [name]

## 2021-11-21 LAB — COMPREHENSIVE METABOLIC PANEL
ALT: 48 IU/L — ABNORMAL HIGH (ref 0–32)
AST: 44 IU/L — ABNORMAL HIGH (ref 0–40)
Albumin/Globulin Ratio: 1.8 (ref 1.2–2.2)
Albumin: 4.4 g/dL (ref 3.7–4.7)
Alkaline Phosphatase: 107 IU/L (ref 44–121)
BUN/Creatinine Ratio: 22 (ref 12–28)
BUN: 31 mg/dL — ABNORMAL HIGH (ref 8–27)
Bilirubin Total: 0.8 mg/dL (ref 0.0–1.2)
CO2: 21 mmol/L (ref 20–29)
Calcium: 9.8 mg/dL (ref 8.7–10.3)
Chloride: 109 mmol/L — ABNORMAL HIGH (ref 96–106)
Creatinine, Ser: 1.39 mg/dL — ABNORMAL HIGH (ref 0.57–1.00)
Globulin, Total: 2.4 g/dL (ref 1.5–4.5)
Glucose: 97 mg/dL (ref 70–99)
Potassium: 5.3 mmol/L — ABNORMAL HIGH (ref 3.5–5.2)
Sodium: 142 mmol/L (ref 134–144)
Total Protein: 6.8 g/dL (ref 6.0–8.5)
eGFR: 36 mL/min/{1.73_m2} — ABNORMAL LOW (ref >59)

## 2021-11-21 LAB — CBC WITH DIFFERENTIAL/PLATELET
Basophils Absolute: 0.1 10*3/uL (ref 0.0–0.2)
Basos: 1 %
EOS (ABSOLUTE): 0.3 10*3/uL (ref 0.0–0.4)
Eos: 5 %
Hematocrit: 40.8 % (ref 34.0–46.6)
Hemoglobin: 13.4 g/dL (ref 11.1–15.9)
Immature Grans (Abs): 0 10*3/uL (ref 0.0–0.1)
Immature Granulocytes: 1 %
Lymphocytes Absolute: 1.7 10*3/uL (ref 0.7–3.1)
Lymphs: 28 %
MCH: 30.9 pg (ref 26.6–33.0)
MCHC: 32.8 g/dL (ref 31.5–35.7)
MCV: 94 fL (ref 79–97)
Monocytes Absolute: 0.6 10*3/uL (ref 0.1–0.9)
Monocytes: 10 %
Neutrophils Absolute: 3.5 10*3/uL (ref 1.4–7.0)
Neutrophils: 55 %
Platelets: 183 10*3/uL (ref 150–450)
RBC: 4.33 x10E6/uL (ref 3.77–5.28)
RDW: 12.5 % (ref 11.7–15.4)
WBC: 6.3 10*3/uL (ref 3.4–10.8)

## 2021-11-21 LAB — LIPID PANEL
Chol/HDL Ratio: 2.1 ratio (ref 0.0–4.4)
Cholesterol, Total: 150 mg/dL (ref 100–199)
HDL: 73 mg/dL (ref >39)
LDL Chol Calc (NIH): 63 mg/dL (ref 0–99)
Triglycerides: 69 mg/dL (ref 0–149)
VLDL Cholesterol Cal: 14 mg/dL (ref 5–40)

## 2021-11-21 LAB — VITAMIN D 25 HYDROXY (VIT D DEFICIENCY, FRACTURES): Vit D, 25-Hydroxy: 45.4 ng/mL (ref 30.0–100.0)

## 2021-11-21 LAB — TSH: TSH: 3.75 u[IU]/mL (ref 0.450–4.500)

## 2021-11-21 LAB — CARDIOVASCULAR RISK ASSESSMENT

## 2021-11-24 LAB — CUP PACEART REMOTE DEVICE CHECK
Date Time Interrogation Session: 20231002231617
Implantable Pulse Generator Implant Date: 20220708

## 2021-11-25 ENCOUNTER — Ambulatory Visit (INDEPENDENT_AMBULATORY_CARE_PROVIDER_SITE_OTHER): Payer: Medicare Other

## 2021-11-25 DIAGNOSIS — Z8673 Personal history of transient ischemic attack (TIA), and cerebral infarction without residual deficits: Secondary | ICD-10-CM | POA: Diagnosis not present

## 2021-11-26 ENCOUNTER — Other Ambulatory Visit: Payer: Self-pay

## 2021-11-26 DIAGNOSIS — J438 Other emphysema: Secondary | ICD-10-CM

## 2021-11-26 MED ORDER — FLUTICASONE-SALMETEROL 115-21 MCG/ACT IN AERO: 2.0000 | INHALATION_SPRAY | Freq: Two times a day (BID) | RESPIRATORY_TRACT | 12 refills | Status: DC

## 2021-12-02 DIAGNOSIS — Z23 Encounter for immunization: Secondary | ICD-10-CM | POA: Diagnosis not present

## 2021-12-04 NOTE — Progress Notes (Signed)
Carelink Summary Report / Loop Recorder 

## 2021-12-18 ENCOUNTER — Encounter: Payer: Self-pay | Admitting: Physician Assistant

## 2021-12-18 ENCOUNTER — Ambulatory Visit (INDEPENDENT_AMBULATORY_CARE_PROVIDER_SITE_OTHER): Payer: Medicare Other | Admitting: Physician Assistant

## 2021-12-18 VITALS — BP 118/70 | HR 73 | Temp 97.2°F | Ht 63.0 in | Wt 132.6 lb

## 2021-12-18 DIAGNOSIS — H6991 Unspecified Eustachian tube disorder, right ear: Secondary | ICD-10-CM | POA: Diagnosis not present

## 2021-12-18 MED ORDER — PREDNISONE 20 MG PO TABS: ORAL_TABLET | ORAL | 0 refills | Status: AC

## 2021-12-18 NOTE — Progress Notes (Signed)
Subjective:  Patient ID: Claudia Rasmussen, female    DOB: 08/25/1931  Age: 88 y.o. MRN: [id number]  Chief Complaint  Patient presents with      Ear Fullness    HPI  Pt in for recheck of cough/bronchitis.  She states that since starting advair her cough has resolved.  She is still however feeling popping in her right ear and has pnd almost daily Denies fever.  Is taking zyrtec and Flonase Current Outpatient Medications on File Prior to Visit  Medication Sig Dispense Refill   atorvastatin (LIPITOR) 40 MG tablet TAKE 1 TABLET DAILY 90 tablet 3   cetirizine (ZYRTEC) 10 MG tablet Take 1 tablet (10 mg total) by mouth at bedtime. 90 tablet 3   Cholecalciferol (VITAMIN D3) 25 MCG (1000 UT) CAPS Take 1,000 Units by mouth in the morning.     clopidogrel (PLAVIX) 75 MG tablet TAKE 1 TABLET DAILY 90 tablet 3   denosumab (PROLIA) 60 MG/ML SOSY injection Inject 60 mg into the skin every 6 (six) months.     fluticasone (FLONASE) 50 MCG/ACT nasal spray Place 2 sprays into both nostrils daily. 16 g 6   fluticasone-salmeterol (ADVAIR HFA) 115-21 MCG/ACT inhaler Inhale 2 puffs into the lungs 2 (two) times daily. 1 each 12   levothyroxine (SYNTHROID) 75 MCG tablet TAKE 1 TABLET DAILY 90 tablet 3   MAGNESIUM OXIDE PO Take by mouth.     Melatonin-Pyridoxine (MELATIN PO) Take by mouth.     nebivolol (BYSTOLIC) 2.5 MG tablet TAKE 1 TABLET(2.5 MG) BY MOUTH DAILY 90 tablet 3   Polyvinyl Alcohol-Povidone (REFRESH OP) Place 1 drop into both eyes in the morning and at bedtime.     Probiotic Product (PROBIOTIC-10 PO) Take 1 capsule by mouth daily.     nitroGLYCERIN (NITROSTAT) 0.4 MG SL tablet Place 1 tablet (0.4 mg total) under the tongue every 5 (five) minutes as needed for chest pain. (Patient not taking: Reported on 05/13/2021) 90 tablet 3   No current facility-administered medications on file prior to visit.   Past Medical History:  Diagnosis Date   Age-related osteoporosis without current pathological  fracture    Arthritis    Atrophy of thyroid (acquired)    Benign hypertension 09/09/2020   Blepharitis 2021   Cholelithiases    Clostridium difficile infection    Diverticulitis    Diverticulitis of large intestine without perforation or abscess without bleeding    Family history of colon cancer    Migraine without aura, not intractable, without status migrainosus    Mixed hyperlipidemia    Stroke [facility])    Thyroid disease    UTI (urinary tract infection)    Vaginal prolapse    Vitamin D deficiency, unspecified    Past Surgical History:  Procedure Laterality Date   ABDOMINAL HYSTERECTOMY     APPENDECTOMY  1969   CATARACT EXTRACTION, BILATERAL Bilateral    right- 03/22/2018 and left 05/10/2018   COLONOSCOPY  05/12/2009   Colonic polyp status post polypectomy. Moderate predominanltly left colonic diverticulosis. Internal hemorrhoids   LEFT HEART CATH AND CORONARY ANGIOGRAPHY N/A 10/14/2020   Procedure: LEFT HEART CATH AND CORONARY ANGIOGRAPHY;  Surgeon: Jettie Booze, MD;  Location: [place] [facility];  Service: Cardiovascular;  Laterality: N/A;   LOOP RECORDER INSERTION N/A 08/29/2020   Procedure: LOOP RECORDER INSERTION;  Surgeon: Vickie Epley, MD;  Location: [place] [facility];  Service: Cardiovascular;  Laterality: N/A;   OOPHORECTOMY Left     Family History  Problem Relation Age of Onset   Breast cancer Mother    Colon cancer Father    Prostate cancer Brother    Coronary artery disease Other    Hypertension Other    Esophageal cancer Neg Hx    Rectal cancer Neg Hx    Stomach cancer Neg Hx    Social History   Socioeconomic History   Marital status: Widowed    Spouse name: Not on file   Number of children: 3   Years of education: Not on file   Highest education level: Not on file  Occupational History   Not on file  Tobacco Use   Smoking status: Former   Smokeless tobacco: Never  Vaping Use   Vaping Use: Never used  Substance and Sexual Activity    Alcohol use: Yes    Comment: Drinks alcohol very infrequently. She typically consumes white wine.   Drug use: Never   Sexual activity: Not on file  Other Topics Concern   Not on file  Social History Narrative   Not on file   Social Determinants of Health   Financial Resource Strain: Not on file  Food Insecurity: No Food Insecurity (04/27/2021)   Hunger Vital Sign    Worried About Running Out of Food in the Last Year: Never true    Ran Out of Food in the Last Year: Never true  Transportation Needs: No Transportation Needs (04/27/2021)   PRAPARE - Hydrologist (Medical): No    Lack of Transportation (Non-Medical): No  Physical Activity: Not on file  Stress: Not on file  Social Connections: Not on file    Review of Systems CONSTITUTIONAL: Negative for chills, fatigue, fever, E/N/T: see HPI CARDIOVASCULAR: Negative for chest pain, dizziness,  RESPIRATORY:see HPI GASTROINTESTINAL: Negative for abdominal pain, acid reflux symptoms, constipation, diarrhea, nausea and vomiting.        Objective:  PHYSICAL EXAM:   VS: BP 118/70   Pulse 73   Temp (!) 97.2 F (36.2 C)   Ht '5\' 3"'$  (1.6 m)   Wt 132 lb 9.6 oz (60.1 kg)   SpO2 94%   BMI 23.49 kg/m   GEN: Well nourished, well developed, in no acute distress  HEENT: normal external ears and nose - normal external auditory canals and left TM - right TM minimally retracted -  - Lips, Teeth and Gums - normal  Oropharynx - normal mucosa, palate, and posterior pharynx Cardiac: RRR; no murmurs,  Respiratory:  normal respiratory rate and pattern with no distress - normal breath sounds with no rales, rhonchi, wheezes or rubs    Lab Results  Component Value Date   WBC 6.3 11/20/2021   HGB 13.4 11/20/2021   HCT 40.8 11/20/2021   PLT 183 11/20/2021   GLUCOSE 97 11/20/2021   CHOL 150 11/20/2021   TRIG 69 11/20/2021   HDL 73 11/20/2021   LDLCALC 63 11/20/2021   ALT 48 (H) 11/20/2021   AST 44 (H) 11/20/2021    NA 142 11/20/2021   K 5.3 (H) 11/20/2021   CL 109 (H) 11/20/2021   CREATININE 1.39 (H) 11/20/2021   BUN 31 (H) 11/20/2021   CO2 21 11/20/2021   TSH 3.750 11/20/2021   INR 1.0 08/27/2020   HGBA1C 5.4 08/28/2020      Assessment & Plan:   Problem List Items Addressed This Visit       Nervous and Auditory   Dysfunction of right eustachian tube - Primary   Relevant  Medications   predniSONE (DELTASONE) 20 MG tablet Continue zyrtec and flonase Recommend salt water gargles bid  .  Meds ordered this encounter  Medications   predniSONE (DELTASONE) 20 MG tablet    Sig: Take 3 tablets (60 mg total) by mouth daily with breakfast for 3 days, THEN 2 tablets (40 mg total) daily with breakfast for 3 days, THEN 1 tablet (20 mg total) daily with breakfast for 3 days.    Dispense:  18 tablet    Refill:  0    Order Specific Question:   Supervising Provider    Answer:   Shelton Silvas    No orders of the defined types were placed in this encounter.    Follow-up: Return in about 5 months (around 05/19/2022) for fasting with me ----- also schedule with Maudie Mercury for [facility] wellness.  An After Visit Summary was printed and given to the patient.  Yetta Flock [facility] [phone]

## 2021-12-21 ENCOUNTER — Other Ambulatory Visit: Payer: Self-pay | Admitting: Family Medicine

## 2021-12-28 ENCOUNTER — Ambulatory Visit (INDEPENDENT_AMBULATORY_CARE_PROVIDER_SITE_OTHER): Payer: Medicare Other

## 2021-12-28 DIAGNOSIS — Z8673 Personal history of transient ischemic attack (TIA), and cerebral infarction without residual deficits: Secondary | ICD-10-CM

## 2021-12-29 LAB — CUP PACEART REMOTE DEVICE CHECK
Date Time Interrogation Session: 20231104230919
Implantable Pulse Generator Implant Date: 20220708

## 2022-01-12 DIAGNOSIS — H18523 Epithelial (juvenile) corneal dystrophy, bilateral: Secondary | ICD-10-CM | POA: Diagnosis not present

## 2022-01-12 DIAGNOSIS — H16223 Keratoconjunctivitis sicca, not specified as Sjogren's, bilateral: Secondary | ICD-10-CM | POA: Diagnosis not present

## 2022-02-01 ENCOUNTER — Ambulatory Visit (INDEPENDENT_AMBULATORY_CARE_PROVIDER_SITE_OTHER): Payer: Medicare Other

## 2022-02-01 ENCOUNTER — Other Ambulatory Visit: Payer: Self-pay | Admitting: Physician Assistant

## 2022-02-01 DIAGNOSIS — Z8673 Personal history of transient ischemic attack (TIA), and cerebral infarction without residual deficits: Secondary | ICD-10-CM

## 2022-02-01 LAB — CUP PACEART REMOTE DEVICE CHECK
Date Time Interrogation Session: 20231210232001
Implantable Pulse Generator Implant Date: 20220708

## 2022-02-01 NOTE — Progress Notes (Signed)
Carelink Summary Report / Loop Recorder 

## 2022-02-10 ENCOUNTER — Other Ambulatory Visit: Payer: Self-pay | Admitting: Family Medicine

## 2022-02-25 ENCOUNTER — Other Ambulatory Visit: Payer: Self-pay | Admitting: Physician Assistant

## 2022-02-25 DIAGNOSIS — H6991 Unspecified Eustachian tube disorder, right ear: Secondary | ICD-10-CM

## 2022-02-25 MED ORDER — FLUTICASONE PROPIONATE 50 MCG/ACT NA SUSP: 2.0000 | Freq: Every day | NASAL | 3 refills | Status: DC

## 2022-03-01 DIAGNOSIS — R339 Retention of urine, unspecified: Secondary | ICD-10-CM | POA: Diagnosis not present

## 2022-03-01 DIAGNOSIS — N390 Urinary tract infection, site not specified: Secondary | ICD-10-CM | POA: Diagnosis not present

## 2022-03-01 DIAGNOSIS — N8110 Cystocele, unspecified: Secondary | ICD-10-CM | POA: Diagnosis not present

## 2022-03-03 ENCOUNTER — Ambulatory Visit (INDEPENDENT_AMBULATORY_CARE_PROVIDER_SITE_OTHER): Payer: Medicare Other

## 2022-03-03 DIAGNOSIS — M818 Other osteoporosis without current pathological fracture: Secondary | ICD-10-CM

## 2022-03-03 MED ORDER — DENOSUMAB 60 MG/ML ~~LOC~~ SOSY
60.0000 mg | PREFILLED_SYRINGE | Freq: Once | SUBCUTANEOUS | Status: AC
  Administered 2022-03-03: 60 mg via SUBCUTANEOUS

## 2022-03-05 ENCOUNTER — Other Ambulatory Visit: Payer: Self-pay | Admitting: Physician Assistant

## 2022-03-08 ENCOUNTER — Ambulatory Visit (INDEPENDENT_AMBULATORY_CARE_PROVIDER_SITE_OTHER): Payer: Medicare Other

## 2022-03-08 DIAGNOSIS — I6300 Cerebral infarction due to thrombosis of unspecified precerebral artery: Secondary | ICD-10-CM | POA: Diagnosis not present

## 2022-03-10 LAB — CUP PACEART REMOTE DEVICE CHECK
Date Time Interrogation Session: 20240112231457
Implantable Pulse Generator Implant Date: 20220708

## 2022-03-11 NOTE — Progress Notes (Signed)
[facility] Summary Report / Loop Recorder

## 2022-04-11 LAB — CUP PACEART REMOTE DEVICE CHECK
Date Time Interrogation Session: 20240214231908
Implantable Pulse Generator Implant Date: 20220708

## 2022-04-12 ENCOUNTER — Ambulatory Visit (INDEPENDENT_AMBULATORY_CARE_PROVIDER_SITE_OTHER): Payer: Medicare Other

## 2022-04-12 DIAGNOSIS — Z8673 Personal history of transient ischemic attack (TIA), and cerebral infarction without residual deficits: Secondary | ICD-10-CM

## 2022-04-20 NOTE — Progress Notes (Signed)
Carelink Summary Report / Loop Recorder 

## 2022-05-13 DIAGNOSIS — N1832 Chronic kidney disease, stage 3b: Secondary | ICD-10-CM | POA: Diagnosis not present

## 2022-05-14 DIAGNOSIS — I129 Hypertensive chronic kidney disease with stage 1 through stage 4 chronic kidney disease, or unspecified chronic kidney disease: Secondary | ICD-10-CM | POA: Diagnosis not present

## 2022-05-14 DIAGNOSIS — N1832 Chronic kidney disease, stage 3b: Secondary | ICD-10-CM | POA: Diagnosis not present

## 2022-05-14 DIAGNOSIS — Z87891 Personal history of nicotine dependence: Secondary | ICD-10-CM | POA: Diagnosis not present

## 2022-05-15 LAB — CUP PACEART REMOTE DEVICE CHECK
Date Time Interrogation Session: 20240318231638
Implantable Pulse Generator Implant Date: 20220708

## 2022-05-17 ENCOUNTER — Ambulatory Visit (INDEPENDENT_AMBULATORY_CARE_PROVIDER_SITE_OTHER): Payer: Medicare Other

## 2022-05-17 DIAGNOSIS — Z8673 Personal history of transient ischemic attack (TIA), and cerebral infarction without residual deficits: Secondary | ICD-10-CM

## 2022-05-20 ENCOUNTER — Ambulatory Visit (INDEPENDENT_AMBULATORY_CARE_PROVIDER_SITE_OTHER): Payer: Medicare Other | Admitting: Physician Assistant

## 2022-05-20 ENCOUNTER — Encounter: Payer: Self-pay | Admitting: Physician Assistant

## 2022-05-20 VITALS — BP 120/86 | HR 71 | Temp 96.8°F | Ht 63.0 in | Wt 131.0 lb

## 2022-05-20 DIAGNOSIS — I719 Aortic aneurysm of unspecified site, without rupture: Secondary | ICD-10-CM | POA: Insufficient documentation

## 2022-05-20 DIAGNOSIS — I2510 Atherosclerotic heart disease of native coronary artery without angina pectoris: Secondary | ICD-10-CM

## 2022-05-20 DIAGNOSIS — E559 Vitamin D deficiency, unspecified: Secondary | ICD-10-CM | POA: Diagnosis not present

## 2022-05-20 DIAGNOSIS — E782 Mixed hyperlipidemia: Secondary | ICD-10-CM | POA: Diagnosis not present

## 2022-05-20 DIAGNOSIS — E038 Other specified hypothyroidism: Secondary | ICD-10-CM | POA: Diagnosis not present

## 2022-05-20 DIAGNOSIS — I10 Essential (primary) hypertension: Secondary | ICD-10-CM | POA: Diagnosis not present

## 2022-05-20 DIAGNOSIS — K219 Gastro-esophageal reflux disease without esophagitis: Secondary | ICD-10-CM | POA: Diagnosis not present

## 2022-05-20 DIAGNOSIS — M818 Other osteoporosis without current pathological fracture: Secondary | ICD-10-CM

## 2022-05-20 DIAGNOSIS — Z950 Presence of cardiac pacemaker: Secondary | ICD-10-CM | POA: Diagnosis not present

## 2022-05-20 DIAGNOSIS — J438 Other emphysema: Secondary | ICD-10-CM

## 2022-05-20 DIAGNOSIS — N3001 Acute cystitis with hematuria: Secondary | ICD-10-CM

## 2022-05-20 DIAGNOSIS — N1832 Chronic kidney disease, stage 3b: Secondary | ICD-10-CM | POA: Diagnosis not present

## 2022-05-20 LAB — POCT URINALYSIS DIP (CLINITEK)
Bilirubin, UA: NEGATIVE
Glucose, UA: NEGATIVE mg/dL
Ketones, POC UA: NEGATIVE mg/dL
Nitrite, UA: NEGATIVE
Spec Grav, UA: 1.01 (ref 1.010–1.025)
Urobilinogen, UA: 0.2 E.U./dL
pH, UA: 8 (ref 5.0–8.0)

## 2022-05-20 MED ORDER — FAMOTIDINE 20 MG PO TABS: 20.0000 mg | ORAL_TABLET | Freq: Two times a day (BID) | ORAL | 2 refills | Status: DC

## 2022-05-20 MED ORDER — SULFAMETHOXAZOLE-TRIMETHOPRIM 800-160 MG PO TABS: 1.0000 | ORAL_TABLET | Freq: Two times a day (BID) | ORAL | 0 refills | Status: DC

## 2022-05-20 NOTE — Progress Notes (Signed)
Established Patient Office Visit  Subjective:  Patient ID: Claudia Rasmussen, female    DOB: May 30, 1931  Age: 89 y.o. MRN: [id number]  CC:  Chief Complaint  Patient presents with   Hypothyroidism    HPI Claudia Rasmussen presents for follow up hypothyroidism and chronic medical issues  [facility] presents with atrophy of thyroid (acquired).  Date of diagnosis 2002.  She is currently taking Synthroid, 75 mcg daily. Is due for labwork today    Pt presents with hyperlipidemia.  Date of diagnosis 09/2009.  Current treatment includes lipitor 40mg  qd Compliance with treatment has been good; she takes her medication as directed, maintains her low cholesterol diet, follows up as directed, and maintains her exercise regimen.  She denies experiencing any hypercholesterolemia related symptoms  Dx with age-related osteoporosis without current pathological fracture; this has been a problem for the past several years.  She describes the intensity of arthralgias as moderate.She is currently on Prolia injections   Pt with history of Chronic Kidney disease - is following with nephrology now on a every 6 month basis- states everything has been stable recently with most recent visit last week  Pt with history of emphysema - currently on advair and symptoms stable  Pt with complaints of reflux symptoms after eating - states she coughs up phlegm sometimes as well - would like to try rx to help symptoms  Pt with history of CAD and having a pacemaker - she is overdue for cardiology follow up - will get appt scheduled  Pt states she is having urinary nocturia and frequency for the past several days - actually has chronic follow up with Dr Nila Nephew next week .  Past Medical History:  Diagnosis Date   Age-related osteoporosis without current pathological fracture    Arthritis    Atrophy of thyroid (acquired)    Benign hypertension 09/09/2020   Blepharitis 2021   Cholelithiases    Clostridium difficile infection     Diverticulitis    Diverticulitis of large intestine without perforation or abscess without bleeding    Family history of colon cancer    Migraine without aura, not intractable, without status migrainosus    Mixed hyperlipidemia    Stroke [facility])    Thyroid disease    UTI (urinary tract infection)    Vaginal prolapse    Vitamin D deficiency, unspecified     Past Surgical History:  Procedure Laterality Date   ABDOMINAL HYSTERECTOMY     APPENDECTOMY  1969   CATARACT EXTRACTION, BILATERAL Bilateral    right- 03/22/2018 and left 05/10/2018   COLONOSCOPY  05/12/2009   Colonic polyp status post polypectomy. Moderate predominanltly left colonic diverticulosis. Internal hemorrhoids   LEFT HEART CATH AND CORONARY ANGIOGRAPHY N/A 10/14/2020   Procedure: LEFT HEART CATH AND CORONARY ANGIOGRAPHY;  Surgeon: Jettie Booze, MD;  Location: [place] [facility];  Service: Cardiovascular;  Laterality: N/A;   LOOP RECORDER INSERTION N/A 08/29/2020   Procedure: LOOP RECORDER INSERTION;  Surgeon: Vickie Epley, MD;  Location: [place] [facility];  Service: Cardiovascular;  Laterality: N/A;   MOHS SURGERY  2023   Forehead   OOPHORECTOMY Left     Family History  Problem Relation Age of Onset   Breast cancer Mother    Colon cancer Father    Prostate cancer Brother    Coronary artery disease Other    Hypertension Other    Esophageal cancer Neg Hx    Rectal cancer Neg Hx    Stomach cancer Neg  Hx     Social History   Socioeconomic History   Marital status: Widowed    Spouse name: Not on file   Number of children: 3   Years of education: Not on file   Highest education level: Not on file  Occupational History   Not on file  Tobacco Use   Smoking status: Former   Smokeless tobacco: Never  Vaping Use   Vaping Use: Never used  Substance and Sexual Activity   Alcohol use: Yes    Comment: Drinks alcohol very infrequently. She typically consumes white wine.   Drug use: Never   Sexual  activity: Not on file  Other Topics Concern   Not on file  Social History Narrative   Lives at [place] Strain: Not on file  Food Insecurity: No Food Insecurity (04/27/2021)   Hunger Vital Sign    Worried About Running Out of Food in the Last Year: Never true    Ran Out of Food in the Last Year: Never true  Transportation Needs: No Transportation Needs (04/27/2021)   PRAPARE - Hydrologist (Medical): No    Lack of Transportation (Non-Medical): No  Physical Activity: Not on file  Stress: Not on file  Social Connections: Not on file  Intimate Partner Violence: Not on file     Current Outpatient Medications:    atorvastatin (LIPITOR) 40 MG tablet, TAKE 1 TABLET DAILY, Disp: 90 tablet, Rfl: 3   cetirizine (ZYRTEC) 10 MG tablet, TAKE 1 TABLET AT BEDTIME, Disp: 90 tablet, Rfl: 3   Cholecalciferol (VITAMIN D3) 25 MCG (1000 UT) CAPS, Take 1,000 Units by mouth in the morning., Disp: , Rfl:    clopidogrel (PLAVIX) 75 MG tablet, TAKE 1 TABLET DAILY, Disp: 90 tablet, Rfl: 3   denosumab (PROLIA) 60 MG/ML SOSY injection, Inject 60 mg into the skin every 6 (six) months., Disp: , Rfl:    famotidine (PEPCID) 20 MG tablet, Take 1 tablet (20 mg total) by mouth 2 (two) times daily., Disp: 30 tablet, Rfl: 2   fluticasone (FLONASE) 50 MCG/ACT nasal spray, Place 2 sprays into both nostrils daily., Disp: 50 g, Rfl: 3   fluticasone-salmeterol (ADVAIR HFA) 115-21 MCG/ACT inhaler, Inhale 2 puffs into the lungs 2 (two) times daily., Disp: 1 each, Rfl: 12   levothyroxine (SYNTHROID) 75 MCG tablet, TAKE 1 TABLET DAILY, Disp: 90 tablet, Rfl: 3   Melatonin-Pyridoxine (MELATIN PO), Take by mouth., Disp: , Rfl:    nebivolol (BYSTOLIC) 2.5 MG tablet, TAKE 1 TABLET(2.5 MG) BY MOUTH DAILY, Disp: 90 tablet, Rfl: 3   Polyvinyl Alcohol-Povidone (REFRESH OP), Place 1 drop into both eyes in the morning and at bedtime., Disp: ,  Rfl:    Probiotic Product (PROBIOTIC-10 PO), Take 1 capsule by mouth daily., Disp: , Rfl:    sulfamethoxazole-trimethoprim (BACTRIM DS) 800-160 MG tablet, Take 1 tablet by mouth 2 (two) times daily., Disp: 20 tablet, Rfl: 0   nitroGLYCERIN (NITROSTAT) 0.4 MG SL tablet, Place 1 tablet (0.4 mg total) under the tongue every 5 (five) minutes as needed for chest pain. (Patient not taking: Reported on 05/13/2021), Disp: 90 tablet, Rfl: 3   Allergies  Allergen Reactions   Shellfish Allergy Other (See Comments)    Itching, Hives Itching, Hives   Cephalosporins Other (See Comments)    Itching    Levaquin [Levofloxacin]    Nitrofuran Derivatives Other (See Comments)    Made her not  feel good, caused tremors, increased creatnine   Azithromycin Rash   Moxifloxacin Hcl Rash   Phenobarbital Rash   CONSTITUTIONAL: Negative for chills, fatigue, fever, unintentional weight gain and unintentional weight loss.  E/N/T: Negative for ear pain, nasal congestion and sore throat.  CARDIOVASCULAR: Negative for chest pain, dizziness, palpitations and pedal edema.  RESPIRATORY: Negative for recent cough and dyspnea.  GASTROINTESTINAL- see HPI GU - see HPI MSK: Negative for arthralgias and myalgias.  INTEGUMENTARY: Negative for rash.  NEUROLOGICAL: Negative for dizziness and headaches.  PSYCHIATRIC: Negative for sleep disturbance and to question depression screen.  Negative for depression, negative for anhedonia.            Objective:  PHYSICAL EXAM:   VS: BP 120/86 (BP Location: Left Arm, Patient Position: Sitting, Cuff Size: Normal)   Pulse 71   Temp (!) 96.8 F (36 C) (Temporal)   Ht 5\' 3"  (1.6 m)   Wt 131 lb (59.4 kg)   SpO2 92%   BMI 23.21 kg/m   GEN: Well nourished, well developed, in no acute distress  Cardiac: RRR; no murmurs, rubs, or gallops,no edema -  Respiratory:  normal respiratory rate and pattern with no distress - normal breath sounds with no rales, rhonchi, wheezes or rubs MS:  no deformity or atrophy  Skin: warm and dry, no rash  Neuro:  Alert and Oriented x 3, - CN II-Xii grossly intact Psych: euthymic mood, appropriate affect and demeanor  Office Visit on 05/20/2022  Component Date Value Ref Range Status   Color, UA 05/20/2022 light yellow (A)  yellow Final   Clarity, UA 05/20/2022 cloudy (A)  clear Final   Glucose, UA 05/20/2022 negative  negative mg/dL Final   Bilirubin, UA 05/20/2022 negative  negative Final   Ketones, POC UA 05/20/2022 negative  negative mg/dL Final   Spec Grav, UA 05/20/2022 1.010  1.010 - 1.025 Final   Blood, UA 05/20/2022 moderate (A)  negative Final   pH, UA 05/20/2022 8.0  5.0 - 8.0 Final   POC PROTEIN,UA 05/20/2022 trace  negative, trace Final   Urobilinogen, UA 05/20/2022 0.2  0.2 or 1.0 E.U./dL Final   Nitrite, UA 05/20/2022 Negative  Negative Final   Leukocytes, UA 05/20/2022 Large (3+) (A)  Negative Final    Health Maintenance Due  Topic Date Due   DTaP/Tdap/Td (1 - Tdap) Never done   Medicare Annual Wellness (AWV)  12/14/2019   COVID-19 Vaccine (8 - 2023-24 season) 01/27/2022     There are no preventive care reminders to display for this patient.  Lab Results  Component Value Date   TSH 3.750 11/20/2021   Lab Results  Component Value Date   WBC 6.3 11/20/2021   HGB 13.4 11/20/2021   HCT 40.8 11/20/2021   MCV 94 11/20/2021   PLT 183 11/20/2021   Lab Results  Component Value Date   NA 142 11/20/2021   K 5.3 (H) 11/20/2021   CO2 21 11/20/2021   GLUCOSE 97 11/20/2021   BUN 31 (H) 11/20/2021   CREATININE 1.39 (H) 11/20/2021   BILITOT 0.8 11/20/2021   ALKPHOS 107 11/20/2021   AST 44 (H) 11/20/2021   ALT 48 (H) 11/20/2021   PROT 6.8 11/20/2021   ALBUMIN 4.4 11/20/2021   CALCIUM 9.8 11/20/2021   ANIONGAP 9 10/06/2021   EGFR 36 (L) 11/20/2021   Lab Results  Component Value Date   CHOL 150 11/20/2021   Lab Results  Component Value Date   HDL 73 11/20/2021  Lab Results  Component Value Date    LDLCALC 63 11/20/2021   Lab Results  Component Value Date   TRIG 69 11/20/2021   Lab Results  Component Value Date   CHOLHDL 2.1 11/20/2021   Lab Results  Component Value Date   HGBA1C 5.4 08/28/2020      Assessment & Plan:   Problem List Items Addressed This Visit       Endocrine   Other specified hypothyroidism - Primary   Relevant Orders   TSH Continue current meds     Genitourinary   Stage 3b chronic kidney disease ([place]) Continue follow up with nephrology     Other   Mixed hyperlipidemia   Relevant Orders   Lipid panel Continue meds   Vit D def Recent lab normal Continue supplement  Emphysema Continue advair  Hx CAD and pacemaker Referral back to cardiology - Dr Harriet Masson      Osteoporosis Continue prolia  GERD Rx for pepcid                   Meds ordered this encounter  Medications   sulfamethoxazole-trimethoprim (BACTRIM DS) 800-160 MG tablet    Sig: Take 1 tablet by mouth 2 (two) times daily.    Dispense:  20 tablet    Refill:  0    Order Specific Question:   Supervising Provider    Answer:   Shelton Silvas   famotidine (PEPCID) 20 MG tablet    Sig: Take 1 tablet (20 mg total) by mouth 2 (two) times daily.    Dispense:  30 tablet    Refill:  2    Order Specific Question:   Supervising Provider    Answer:   Shelton Silvas     Follow-up: Return in about 6 months (around 11/20/2022) for chronic fasting follow up.    SARA R Trenice Mesa, [name]

## 2022-05-21 LAB — CBC WITH DIFFERENTIAL/PLATELET
Basophils Absolute: 0.1 10*3/uL (ref 0.0–0.2)
Basos: 1 %
EOS (ABSOLUTE): 0.2 10*3/uL (ref 0.0–0.4)
Eos: 3 %
Hematocrit: 41.8 % (ref 34.0–46.6)
Hemoglobin: 13.6 g/dL (ref 11.1–15.9)
Immature Grans (Abs): 0.1 10*3/uL (ref 0.0–0.1)
Immature Granulocytes: 1 %
Lymphocytes Absolute: 1.9 10*3/uL (ref 0.7–3.1)
Lymphs: 30 %
MCH: 30.7 pg (ref 26.6–33.0)
MCHC: 32.5 g/dL (ref 31.5–35.7)
MCV: 94 fL (ref 79–97)
Monocytes Absolute: 0.6 10*3/uL (ref 0.1–0.9)
Monocytes: 9 %
Neutrophils Absolute: 3.5 10*3/uL (ref 1.4–7.0)
Neutrophils: 56 %
Platelets: 174 10*3/uL (ref 150–450)
RBC: 4.43 x10E6/uL (ref 3.77–5.28)
RDW: 13 % (ref 11.7–15.4)
WBC: 6.2 10*3/uL (ref 3.4–10.8)

## 2022-05-21 LAB — LIPID PANEL
Chol/HDL Ratio: 1.8 ratio (ref 0.0–4.4)
Cholesterol, Total: 164 mg/dL (ref 100–199)
HDL: 90 mg/dL (ref >39)
LDL Chol Calc (NIH): 62 mg/dL (ref 0–99)
Triglycerides: 58 mg/dL (ref 0–149)
VLDL Cholesterol Cal: 12 mg/dL (ref 5–40)

## 2022-05-21 LAB — COMPREHENSIVE METABOLIC PANEL
ALT: 28 IU/L (ref 0–32)
AST: 25 IU/L (ref 0–40)
Albumin/Globulin Ratio: 1.5 (ref 1.2–2.2)
Albumin: 4.1 g/dL (ref 3.7–4.7)
Alkaline Phosphatase: 88 IU/L (ref 44–121)
BUN/Creatinine Ratio: 22 (ref 12–28)
BUN: 32 mg/dL — ABNORMAL HIGH (ref 8–27)
Bilirubin Total: 0.8 mg/dL (ref 0.0–1.2)
CO2: 22 mmol/L (ref 20–29)
Calcium: 9.4 mg/dL (ref 8.7–10.3)
Chloride: 107 mmol/L — ABNORMAL HIGH (ref 96–106)
Creatinine, Ser: 1.45 mg/dL — ABNORMAL HIGH (ref 0.57–1.00)
Globulin, Total: 2.7 g/dL (ref 1.5–4.5)
Glucose: 87 mg/dL (ref 70–99)
Potassium: 5 mmol/L (ref 3.5–5.2)
Sodium: 140 mmol/L (ref 134–144)
Total Protein: 6.8 g/dL (ref 6.0–8.5)
eGFR: 35 mL/min/{1.73_m2} — ABNORMAL LOW (ref >59)

## 2022-05-21 LAB — TSH: TSH: 2.96 u[IU]/mL (ref 0.450–4.500)

## 2022-05-21 LAB — CARDIOVASCULAR RISK ASSESSMENT

## 2022-05-24 LAB — URINE CULTURE

## 2022-05-26 NOTE — Progress Notes (Signed)
Carelink Summary Report / Loop Recorder 

## 2022-06-04 ENCOUNTER — Ambulatory Visit (INDEPENDENT_AMBULATORY_CARE_PROVIDER_SITE_OTHER): Payer: Medicare Other | Admitting: Family

## 2022-06-04 ENCOUNTER — Encounter (HOSPITAL_BASED_OUTPATIENT_CLINIC_OR_DEPARTMENT_OTHER): Payer: Self-pay | Admitting: Family

## 2022-06-04 VITALS — BP 122/70 | HR 70 | Ht 63.0 in | Wt 137.0 lb

## 2022-06-04 DIAGNOSIS — R0609 Other forms of dyspnea: Secondary | ICD-10-CM | POA: Diagnosis not present

## 2022-06-04 DIAGNOSIS — E785 Hyperlipidemia, unspecified: Secondary | ICD-10-CM

## 2022-06-04 DIAGNOSIS — I10 Essential (primary) hypertension: Secondary | ICD-10-CM | POA: Diagnosis not present

## 2022-06-04 DIAGNOSIS — I25118 Atherosclerotic heart disease of native coronary artery with other forms of angina pectoris: Secondary | ICD-10-CM

## 2022-06-04 DIAGNOSIS — Z8673 Personal history of transient ischemic attack (TIA), and cerebral infarction without residual deficits: Secondary | ICD-10-CM | POA: Diagnosis not present

## 2022-06-04 MED ORDER — NITROGLYCERIN 0.4 MG SL SUBL
0.4000 mg | SUBLINGUAL_TABLET | SUBLINGUAL | 3 refills | Status: AC | PRN
Start: 2022-06-04 — End: 2023-12-06

## 2022-06-04 NOTE — Patient Instructions (Signed)
Medication Instructions:  Your physician recommends that you continue on your current medications as directed. Please refer to the Current Medication list given to you today.  *If you need a refill on your cardiac medications before your next appointment, please call your pharmacy*  Testing/Procedures: Your physician has requested that you have an echocardiogram. Echocardiography is a painless test that uses sound waves to create images of your heart. It provides your doctor with information about the size and shape of your heart and how well your heart's chambers and valves are working. This procedure takes approximately one hour. There are no restrictions for this procedure. Please do NOT wear cologne, perfume, aftershave, or lotions (deodorant is allowed). Please arrive 15 minutes prior to your appointment time.    Follow-Up: At [facility], you and your health needs are our priority.  As part of our continuing mission to provide you with exceptional heart care, we have created designated Provider Care Teams.  These Care Teams include your primary Cardiologist (physician) and Advanced Practice Providers (APPs -  Physician Assistants and Nurse Practitioners) who all work together to provide you with the care you need, when you need it.  We recommend signing up for the patient portal called "MyChart".  Sign up information is provided on this After Visit Summary.  MyChart is used to connect with patients for Virtual Visits (Telemedicine).  Patients are able to view lab/test results, encounter notes, upcoming appointments, etc.  Non-urgent messages can be sent to your provider as well.   To learn more about what you can do with MyChart, go to [email or web address].    Your next appointment:   2-3 month(s)  Provider:   Thomasene Ripple, DO  or APP

## 2022-06-04 NOTE — Progress Notes (Signed)
Office Visit    Patient Name: Claudia Rasmussen Date of Encounter: 06/04/2022  PCP:  Marianne Sofia, Cordelia Poche    [facility]  Cardiologist:  Thomasene Ripple, DO  Advanced Practice Provider:  No care team member to display Electrophysiologist:  None      Chief Complaint    Claudia Rasmussen is a 89 y.o. female presents today for CAD follow up   Past Medical History    Past Medical History:  Diagnosis Date   Age-related osteoporosis without current pathological fracture    Arthritis    Atrophy of thyroid (acquired)    Benign hypertension 09/09/2020   Blepharitis 2021   Cholelithiases    Clostridium difficile infection    Diverticulitis    Diverticulitis of large intestine without perforation or abscess without bleeding    Family history of colon cancer    Migraine without aura, not intractable, without status migrainosus    Mixed hyperlipidemia    Stroke    Thyroid disease    UTI (urinary tract infection)    Vaginal prolapse    Vitamin D deficiency, unspecified    Past Surgical History:  Procedure Laterality Date   ABDOMINAL HYSTERECTOMY     APPENDECTOMY  1969   CATARACT EXTRACTION, BILATERAL Bilateral    right- 03/22/2018 and left 05/10/2018   COLONOSCOPY  05/12/2009   Colonic polyp status post polypectomy. Moderate predominanltly left colonic diverticulosis. Internal hemorrhoids   LEFT HEART CATH AND CORONARY ANGIOGRAPHY N/A 10/14/2020   Procedure: LEFT HEART CATH AND CORONARY ANGIOGRAPHY;  Surgeon: Corky Crafts, MD;  Location: [facility] INVASIVE CV LAB;  Service: Cardiovascular;  Laterality: N/A;   LOOP RECORDER INSERTION N/A 08/29/2020   Procedure: LOOP RECORDER INSERTION;  Surgeon: Lanier Prude, MD;  Location: [facility] INVASIVE CV LAB;  Service: Cardiovascular;  Laterality: N/A;   MOHS SURGERY  2023   Forehead   OOPHORECTOMY Left     Allergies  Allergies  Allergen Reactions   Shellfish Allergy Other (See Comments)    Itching, Hives Itching,  Hives   Cephalosporins Other (See Comments)    Itching    Levaquin [Levofloxacin]    Nitrofuran Derivatives Other (See Comments)    Made her not feel good, caused tremors, increased creatnine   Azithromycin Rash   Moxifloxacin Hcl Rash   Phenobarbital Rash    History of Present Illness    Claudia Rasmussen is a 89 y.o. female with a hx of CVA s/p loop recorder, hyperlipidemia, nonobstructive CAD last seen for [name] 10/14/2020.  Echocardiogram 08/28/2020 LVEF 45-50%, LV global hypokinesis, grade 1 diastolic dysfunction, RV normal size/pressure, trivial MR, mild AI, mild dilation ascending aorta.  Loop recorder inserted 08/29/2020 for cryptogenic stroke with no evidence of arrhythmia.  Seen in clinic 10/09/2020 noting exertional dyspnea and chest pain.  Subsequent LHC 10/14/2020 mild to moderate nonobstructive coronary disease (mid LAD 40% stenosis, proximal RCA 25% stenosed).  Recommended for aggressive secondary prevention.  Presents today for follow-up independently. Resides at [employer] Participates in activities such as craft and quilting fidget blankets. Reports no shortness of breath at rest. Does note dyspnea on exertion with more than usual activity. Does note when walking from her apartment to the main building notes dyspnea. She is not sure if this is related to her emphysema or her sedentary lifestyle. Reports no chest pain, pressure, or tightness. No edema, orthopnea, PND. Reports no palpitations.  She monitors BP at home but reports readings are routinely 180s which is far higher than 90  blood pressure she has had in clinic-encouraged her to bring blood pressure cuff to next office visit to assess for accuracy.  EKGs/Labs/Other Studies Reviewed:   The following studies were reviewed today: Cardiac Studies & Procedures   CARDIAC CATHETERIZATION  CARDIAC CATHETERIZATION 10/14/2020  Narrative   Mid LAD lesion is 40% stenosed.   Prox RCA lesion is 25% stenosed.   LV end diastolic  pressure is low.   There is no aortic valve stenosis.  Medical therapy for mild to moderate CAD.  Continue aggressive secondary prevention.  Hydration post procedure for renal insufficiency.  Findings Coronary Findings Diagnostic  Dominance: Right  Left Anterior Descending The vessel exhibits minimal luminal irregularities. Mid LAD lesion is 40% stenosed.  Left Circumflex The vessel exhibits minimal luminal irregularities.  Right Coronary Artery Vessel is large. The vessel exhibits minimal luminal irregularities. Prox RCA lesion is 25% stenosed.  Intervention  No interventions have been documented.     ECHOCARDIOGRAM  ECHOCARDIOGRAM COMPLETE 08/28/2020  Narrative ECHOCARDIOGRAM REPORT    Patient Name:   Claudia Rasmussen Date of Exam: 08/28/2020 Medical Rec #:  [id number]       Height:       64.0 in Accession #:    [id number]      Weight:       131.0 lb Date of Birth:  [birth year]       BSA:          1.634 m Patient Age:    88 years        BP:           151/90 mmHg Patient Gender: F               HR:           75 bpm. Exam Location:  Inpatient  Procedure: 2D Echo, Cardiac Doppler and Color Doppler  Indications:    Stroke I63.9  History:        Patient has no prior history of Echocardiogram examinations. Risk Factors:Dyslipidemia. Thyroid Disease. Cancer.  Sonographer:    Elmarie Shiley Dance Referring Phys: [id number] Marquita Palms VINCENT  IMPRESSIONS   1. Left ventricular ejection fraction, by estimation, is 45 to 50%. The left ventricle has mildly decreased function. The left ventricle demonstrates global hypokinesis. Left ventricular diastolic parameters are consistent with Grade I diastolic dysfunction (impaired relaxation). 2. Right ventricular systolic function is normal. The right ventricular size is normal. There is normal pulmonary artery systolic pressure. The estimated right ventricular systolic pressure is 20.3 mmHg. 3. The mitral valve is normal in structure.  Trivial mitral valve regurgitation. No evidence of mitral stenosis. 4. The aortic valve is tricuspid. Aortic valve regurgitation is mild. No aortic stenosis is present. 5. Aortic dilatation noted. There is mild dilatation of the ascending aorta, measuring 43 mm. 6. The inferior vena cava is normal in size with greater than 50% respiratory variability, suggesting right atrial pressure of 3 mmHg.  FINDINGS Left Ventricle: Left ventricular ejection fraction, by estimation, is 45 to 50%. The left ventricle has mildly decreased function. The left ventricle demonstrates global hypokinesis. The left ventricular internal cavity size was normal in size. There is no left ventricular hypertrophy. Left ventricular diastolic parameters are consistent with Grade I diastolic dysfunction (impaired relaxation).  Right Ventricle: The right ventricular size is normal. No increase in right ventricular wall thickness. Right ventricular systolic function is normal. There is normal pulmonary artery systolic pressure. The tricuspid regurgitant velocity is 2.08 m/s, and with an assumed right atrial  pressure of 3 mmHg, the estimated right ventricular systolic pressure is 20.3 mmHg.  Left Atrium: Left atrial size was normal in size.  Right Atrium: Right atrial size was normal in size.  Pericardium: There is no evidence of pericardial effusion.  Mitral Valve: The mitral valve is normal in structure. Trivial mitral valve regurgitation. No evidence of mitral valve stenosis.  Tricuspid Valve: The tricuspid valve is normal in structure. Tricuspid valve regurgitation is trivial.  Aortic Valve: The aortic valve is tricuspid. Aortic valve regurgitation is mild. Aortic regurgitation PHT measures 449 msec. No aortic stenosis is present.  Pulmonic Valve: The pulmonic valve was normal in structure. Pulmonic valve regurgitation is not visualized.  Aorta: The aortic root is normal in size and structure and aortic dilatation noted.  There is mild dilatation of the ascending aorta, measuring 43 mm.  Venous: The inferior vena cava is normal in size with greater than 50% respiratory variability, suggesting right atrial pressure of 3 mmHg.  IAS/Shunts: No atrial level shunt detected by color flow Doppler.   LEFT VENTRICLE PLAX 2D LVIDd:         4.20 cm  Diastology LVIDs:         3.40 cm  LV e' medial:    5.35 cm/s LV PW:         1.00 cm  LV E/e' medial:  8.6 LV IVS:        0.80 cm  LV e' lateral:   6.47 cm/s LVOT diam:     1.80 cm  LV E/e' lateral: 7.1 LV SV:         41 LV SV Index:   25 LVOT Area:     2.54 cm   RIGHT VENTRICLE RV Basal diam:  2.10 cm RV S prime:     10.40 cm/s TAPSE (M-mode): 1.6 cm  LEFT ATRIUM             Index       RIGHT ATRIUM          Index LA diam:        2.90 cm 1.77 cm/m  RA Area:     6.78 cm LA Vol (A2C):   42.8 ml 26.19 ml/m RA Volume:   8.89 ml  5.44 ml/m LA Vol (A4C):   30.9 ml 18.91 ml/m LA Biplane Vol: 36.4 ml 22.27 ml/m AORTIC VALVE LVOT Vmax:   84.10 cm/s LVOT Vmean:  54.600 cm/s LVOT VTI:    0.161 m AI PHT:      449 msec  AORTA Ao Root diam: 3.40 cm Ao Asc diam:  4.30 cm  MITRAL VALVE               TRICUSPID VALVE MV Area (PHT): 3.12 cm    TR Peak grad:   17.3 mmHg MV Decel Time: 243 msec    TR Vmax:        208.00 cm/s MV E velocity: 46.10 cm/s SHUNTS Systemic VTI:  0.16 m Systemic Diam: 1.80 cm  Marca Ancona MD Electronically signed by Marca Ancona MD Signature Date/Time: 08/28/2020/4:28:09 PM    Final              EKG:  EKG is ordered today.  The ekg ordered today demonstrates NSR 70 bpmw with PAC. No acute ST/T wave changes.   Recent Labs: 05/20/2022: ALT 28; BUN 32; Creatinine, Ser 1.45; Hemoglobin 13.6; Platelets 174; Potassium 5.0; Sodium 140; TSH 2.960  Recent Lipid Panel    Component Value Date/Time  CHOL 164 05/20/2022 0943   TRIG 58 05/20/2022 0943   HDL 90 05/20/2022 0943   CHOLHDL 1.8 05/20/2022 0943   CHOLHDL 2.7 08/28/2020  0351   VLDL 17 08/28/2020 0351   LDLCALC 62 05/20/2022 0943     Home Medications   Current Meds  Medication Sig   atorvastatin (LIPITOR) 40 MG tablet TAKE 1 TABLET DAILY   cetirizine (ZYRTEC) 10 MG tablet TAKE 1 TABLET AT BEDTIME   Cholecalciferol (VITAMIN D3) 25 MCG (1000 UT) CAPS Take 1,000 Units by mouth in the morning.   clopidogrel (PLAVIX) 75 MG tablet TAKE 1 TABLET DAILY   denosumab (PROLIA) 60 MG/ML SOSY injection Inject 60 mg into the skin every 6 (six) months.   famotidine (PEPCID) 20 MG tablet Take 1 tablet (20 mg total) by mouth 2 (two) times daily.   fluticasone (FLONASE) 50 MCG/ACT nasal spray Place 2 sprays into both nostrils daily.   fluticasone-salmeterol (ADVAIR HFA) 115-21 MCG/ACT inhaler Inhale 2 puffs into the lungs 2 (two) times daily.   levothyroxine (SYNTHROID) 75 MCG tablet TAKE 1 TABLET DAILY   Melatonin-Pyridoxine (MELATIN PO) Take by mouth.   nebivolol (BYSTOLIC) 2.5 MG tablet TAKE 1 TABLET(2.5 MG) BY MOUTH DAILY   Polyvinyl Alcohol-Povidone (REFRESH OP) Place 1 drop into both eyes in the morning and at bedtime.   Probiotic Product (PROBIOTIC-10 PO) Take 1 capsule by mouth daily.   sulfamethoxazole-trimethoprim (BACTRIM DS) 800-160 MG tablet Take 1 tablet by mouth 2 (two) times daily.     Review of Systems      All other systems reviewed and are otherwise negative except as noted above.  Physical Exam    VS:  BP 122/70   Pulse 70   Ht 5\' 3"  (1.6 m)   Wt 137 lb (62.1 kg)   BMI 24.27 kg/m  , BMI Body mass index is 24.27 kg/m.  Wt Readings from Last 3 Encounters:  06/04/22 137 lb (62.1 kg)  05/20/22 131 lb (59.4 kg)  01/07/22 132 lb (59.9 kg)     GEN: Well nourished, well developed, in no acute distress. HEENT: normal. Neck: Supple, no JVD, carotid bruits, or masses. Cardiac: RRR, no murmurs, rubs, or gallops. No clubbing, cyanosis, edema.  Radials/PT 2+ and equal bilaterally.  Respiratory:  Respirations regular and unlabored, clear to  auscultation bilaterally. GI: Soft, nontender, nondistended. MS: No deformity or atrophy. Skin: Warm and dry, no rash. Neuro:  Strength and sensation are intact. Psych: Normal affect.  Assessment & Plan    DOE -exertional dyspnea which has been ongoing for some time.  She is uncertain if it is related to her inactivity or emphysema.  Endorses adherence with Advair inhaler.  Will update echocardiogram to rule out heart failure, wall motion abnormality.  Encouraged her to start participating in 1 exercise class per week at her ILF.  Ascending aortic dilation-echo 08/2020 mild dilation ascending aorta 43 mm.  Continue optimal blood pressure control.  Update echocardiogram to reassess.  CAD -reports no chest pain but does note exertional dyspnea, as above.  No indication for ischemic evaluation at this time.  Continue GDMT Plavix, Bystolic, atorvastatin. Heart healthy diet and regular cardiovascular exercise encouraged.    HTN - BP well controlled. Continue current antihypertensive regimen Bystolic 2.5 mg daily.  Blood pressure markedly higher at home than in clinic-encouraged her to bring blood pressure cuff to next office visit to ensure accuracy.  Hx of CVA - Loop recorder with no arrhythmias.  Continue atorvastatin, Plavix.  HLD,  LDL goal <70 - 04/2022 LDL 62. Continue Atorvastatin  QD.         Disposition: Follow up  in 2-3 months  with Thomasene Ripple, DO or APP.  Signed, Alver Sorrow, NP 06/04/2022, 9:36 AM [facility]

## 2022-06-09 ENCOUNTER — Ambulatory Visit (HOSPITAL_BASED_OUTPATIENT_CLINIC_OR_DEPARTMENT_OTHER)
Admission: RE | Admit: 2022-06-09 | Discharge: 2022-06-09 | Disposition: A | Discharge status: Home / Self Care | Payer: Medicare Other | Source: Ambulatory Visit | Attending: Family | Admitting: Family

## 2022-06-09 DIAGNOSIS — E785 Hyperlipidemia, unspecified: Secondary | ICD-10-CM

## 2022-06-09 DIAGNOSIS — I10 Essential (primary) hypertension: Secondary | ICD-10-CM | POA: Insufficient documentation

## 2022-06-09 DIAGNOSIS — I25118 Atherosclerotic heart disease of native coronary artery with other forms of angina pectoris: Secondary | ICD-10-CM | POA: Insufficient documentation

## 2022-06-09 DIAGNOSIS — Z8673 Personal history of transient ischemic attack (TIA), and cerebral infarction without residual deficits: Secondary | ICD-10-CM | POA: Diagnosis not present

## 2022-06-09 DIAGNOSIS — R0609 Other forms of dyspnea: Secondary | ICD-10-CM | POA: Diagnosis not present

## 2022-06-09 LAB — ECHOCARDIOGRAM COMPLETE
Area-P 1/2: 3.37 cm2
P 1/2 time: 419 msec
S' Lateral: 2.8 cm

## 2022-06-10 ENCOUNTER — Telehealth (HOSPITAL_BASED_OUTPATIENT_CLINIC_OR_DEPARTMENT_OTHER): Payer: Self-pay

## 2022-06-10 DIAGNOSIS — I719 Aortic aneurysm of unspecified site, without rupture: Secondary | ICD-10-CM

## 2022-06-10 DIAGNOSIS — I7121 Aneurysm of the ascending aorta, without rupture: Secondary | ICD-10-CM

## 2022-06-10 NOTE — Telephone Encounter (Addendum)
Call attempt, no answer, unable to leave message.     ----- Message from Alver Sorrow, NP sent at 06/10/2022  7:56 AM EDT ----- Echocardiogram with normal heart pumping function.  Heart muscle mildly stiff which is expected with age.  Mild leaking of the mitral valve, mild to moderate leaking of aortic valve.  Ascending aorta measures 43 mm which is larger than expected.  To prevent this from worsening by keeping blood pressure well-controlled.  Recommend repeat echocardiogram in 1 year for monitoring.  No significant abnormalities that would cause shortness of breath.

## 2022-06-11 NOTE — Telephone Encounter (Signed)
2nd call attempt, Left message for patient to call back    ----- Message from Alver Sorrow, NP sent at 06/10/2022  7:56 AM EDT ----- Echocardiogram with normal heart pumping function.  Heart muscle mildly stiff which is expected with age.  Mild leaking of the mitral valve, mild to moderate leaking of aortic valve.  Ascending aorta measures 43 mm which is larger than expected.  To prevent this from worsening by keeping blood pressure well-controlled.  Recommend repeat echocardiogram in 1 year for monitoring.   No significant abnormalities that would cause shortness of breath.

## 2022-06-14 ENCOUNTER — Encounter (HOSPITAL_BASED_OUTPATIENT_CLINIC_OR_DEPARTMENT_OTHER): Payer: Self-pay

## 2022-06-14 NOTE — Telephone Encounter (Signed)
3rd call attempt, no answer, left message asking pt. To call back. Results mailed to patient, repeat testing ordered.

## 2022-06-14 NOTE — Addendum Note (Signed)
Addended by: Marlene Lard on: 06/14/2022 10:01 AM   Modules accepted: Orders

## 2022-06-15 NOTE — Telephone Encounter (Signed)
Results called to patient who verbalizes understanding!     ----- Message from Alver Sorrow, NP sent at 06/10/2022  7:56 AM EDT ----- Echocardiogram with normal heart pumping function.  Heart muscle mildly stiff which is expected with age.  Mild leaking of the mitral valve, mild to moderate leaking of aortic valve.  Ascending aorta measures 43 mm which is larger than expected.  To prevent this from worsening by keeping blood pressure well-controlled.  Recommend repeat echocardiogram in 1 year for monitoring.   No significant abnormalities that would cause shortness of breath.

## 2022-06-21 ENCOUNTER — Ambulatory Visit (INDEPENDENT_AMBULATORY_CARE_PROVIDER_SITE_OTHER): Payer: Medicare Other

## 2022-06-21 DIAGNOSIS — Z8673 Personal history of transient ischemic attack (TIA), and cerebral infarction without residual deficits: Secondary | ICD-10-CM | POA: Diagnosis not present

## 2022-06-22 LAB — CUP PACEART REMOTE DEVICE CHECK
Date Time Interrogation Session: 20240428231604
Implantable Pulse Generator Implant Date: 20220708

## 2022-06-28 NOTE — Progress Notes (Signed)
Carelink Summary Report / Loop Recorder 

## 2022-07-07 DIAGNOSIS — N390 Urinary tract infection, site not specified: Secondary | ICD-10-CM | POA: Diagnosis not present

## 2022-07-07 DIAGNOSIS — N8110 Cystocele, unspecified: Secondary | ICD-10-CM | POA: Diagnosis not present

## 2022-07-07 DIAGNOSIS — R339 Retention of urine, unspecified: Secondary | ICD-10-CM | POA: Diagnosis not present

## 2022-07-13 DIAGNOSIS — H16223 Keratoconjunctivitis sicca, not specified as Sjogren's, bilateral: Secondary | ICD-10-CM | POA: Diagnosis not present

## 2022-07-13 DIAGNOSIS — H18523 Epithelial (juvenile) corneal dystrophy, bilateral: Secondary | ICD-10-CM | POA: Diagnosis not present

## 2022-07-13 DIAGNOSIS — H52203 Unspecified astigmatism, bilateral: Secondary | ICD-10-CM | POA: Diagnosis not present

## 2022-07-13 DIAGNOSIS — H43393 Other vitreous opacities, bilateral: Secondary | ICD-10-CM | POA: Diagnosis not present

## 2022-07-13 DIAGNOSIS — H524 Presbyopia: Secondary | ICD-10-CM | POA: Diagnosis not present

## 2022-07-13 DIAGNOSIS — H5203 Hypermetropia, bilateral: Secondary | ICD-10-CM | POA: Diagnosis not present

## 2022-07-22 NOTE — Progress Notes (Signed)
Carelink Summary Report / Loop Recorder 

## 2022-07-23 NOTE — Progress Notes (Signed)
This encounter was created in error - please disregard.

## 2022-07-26 ENCOUNTER — Ambulatory Visit (INDEPENDENT_AMBULATORY_CARE_PROVIDER_SITE_OTHER): Payer: Medicare Other

## 2022-07-26 DIAGNOSIS — Z8673 Personal history of transient ischemic attack (TIA), and cerebral infarction without residual deficits: Secondary | ICD-10-CM | POA: Diagnosis not present

## 2022-07-26 LAB — CUP PACEART REMOTE DEVICE CHECK
Date Time Interrogation Session: 20240602231350
Implantable Pulse Generator Implant Date: 20220708

## 2022-08-17 ENCOUNTER — Telehealth: Payer: Self-pay

## 2022-08-17 NOTE — Progress Notes (Signed)
Carelink Summary Report / Loop Recorder 

## 2022-08-17 NOTE — Telephone Encounter (Signed)
   Patient: Claudia Rasmussen  DOB: [birth year]  MRN: [id number]  Prolia benefits verified by [employer] on: 08/11/22 Coverage Available: Buy & Bill Prior authorization NOT REQUIRED Deductible: $240 of $240 met  Benefit Details: : Prolia and administration will be subject to a $240.00 deductible ($240.00 met) and 20% coinsurance.   Secondary plan: is a [employer] that covers the Medicare Part B coinsurance and deductible  Patient agreed to proceed with ordering and will be scheduled once it has arrived in the office.  Patient is due 09/02/22.  Jacklynn Bue, LPN

## 2022-08-30 ENCOUNTER — Ambulatory Visit (INDEPENDENT_AMBULATORY_CARE_PROVIDER_SITE_OTHER): Payer: Medicare Other

## 2022-08-30 DIAGNOSIS — Z8673 Personal history of transient ischemic attack (TIA), and cerebral infarction without residual deficits: Secondary | ICD-10-CM | POA: Diagnosis not present

## 2022-08-30 LAB — CUP PACEART REMOTE DEVICE CHECK
Date Time Interrogation Session: 20240705231023
Implantable Pulse Generator Implant Date: 20220708

## 2022-09-09 ENCOUNTER — Telehealth: Payer: Self-pay

## 2022-09-09 NOTE — Telephone Encounter (Signed)
Called patient and left message making her aware her Prolia has arrived in office and she can call back and make a nurse visit.

## 2022-09-13 NOTE — Telephone Encounter (Signed)
Appointment has been scheduled for 09/14/2022. Patient notified to call 30 minutes prior to the appointment.

## 2022-09-14 ENCOUNTER — Ambulatory Visit (INDEPENDENT_AMBULATORY_CARE_PROVIDER_SITE_OTHER): Payer: Medicare Other

## 2022-09-14 DIAGNOSIS — M818 Other osteoporosis without current pathological fracture: Secondary | ICD-10-CM

## 2022-09-14 MED ORDER — DENOSUMAB 60 MG/ML ~~LOC~~ SOSY
60.0000 mg | PREFILLED_SYRINGE | Freq: Once | SUBCUTANEOUS | Status: AC
Start: 2022-09-14 — End: 2022-09-14
  Administered 2022-09-14: 60 mg via SUBCUTANEOUS

## 2022-09-14 NOTE — Progress Notes (Signed)
   Patient: Claudia Rasmussen  DOB: June 06, 1931  MRN: [id number]    Visit Date: 09/14/2022    [name] presents today for her six month Prolia injection.  1 x 60 mg Single-Dose Prefilled Syringe was given SQ in the left arm.  Patient tolerated the injection well and has no questions.  The next Prolia injection will be due in six months.      Jomarie Longs, CMA

## 2022-09-16 NOTE — Progress Notes (Signed)
Carelink Summary Report / Loop Recorder 

## 2022-10-04 ENCOUNTER — Ambulatory Visit (INDEPENDENT_AMBULATORY_CARE_PROVIDER_SITE_OTHER): Payer: Medicare Other

## 2022-10-04 DIAGNOSIS — I6300 Cerebral infarction due to thrombosis of unspecified precerebral artery: Secondary | ICD-10-CM

## 2022-10-05 ENCOUNTER — Encounter: Payer: Self-pay | Admitting: Cardiology

## 2022-10-05 ENCOUNTER — Ambulatory Visit: Payer: Medicare Other | Admitting: Cardiology

## 2022-10-05 VITALS — BP 132/84 | HR 78 | Ht 63.0 in | Wt 129.4 lb

## 2022-10-05 DIAGNOSIS — I10 Essential (primary) hypertension: Secondary | ICD-10-CM | POA: Diagnosis not present

## 2022-10-05 DIAGNOSIS — I2510 Atherosclerotic heart disease of native coronary artery without angina pectoris: Secondary | ICD-10-CM | POA: Diagnosis not present

## 2022-10-05 DIAGNOSIS — E785 Hyperlipidemia, unspecified: Secondary | ICD-10-CM | POA: Diagnosis not present

## 2022-10-05 DIAGNOSIS — I719 Aortic aneurysm of unspecified site, without rupture: Secondary | ICD-10-CM | POA: Diagnosis not present

## 2022-10-05 NOTE — Progress Notes (Unsigned)
Cardiology Office Note:    Date:  10/06/2022   ID:  Claudia Rasmussen, DOB 04/07/1931, MRN [id number]  PCP:  Marianne Sofia, [name]  Cardiologist:  Thomasene Ripple, DO  Electrophysiologist:  None   Referring MD: Marianne Sofia, [name]   " I am doing well"  History of Present Illness:    Claudia Rasmussen is a 89 y.o. female with a hx of nonobstructive CAD, heart disease, vitamin D deficiency here today for follow-up visit.  Today she tells me that she had had chest pain several months ago took nitroglycerin and it did not help.  But she has not had any recurrent pain.  What is most bothersome is the fact that the patient tells me she has been experiencing some coughing.  She reports that this happening mostly at after she eats and she gets these extensive cough.  She was started on Pepcid but it did not seem to help.  We talked to her PCP to hopefully start Protonix in this patient to see if this can help.  She is very concerned about this.  Past Medical History:  Diagnosis Date   Age-related osteoporosis without current pathological fracture    Arthritis    Atrophy of thyroid (acquired)    Benign hypertension 09/09/2020   Blepharitis 2021   Cholelithiases    Clostridium difficile infection    Diverticulitis    Diverticulitis of large intestine without perforation or abscess without bleeding    Family history of colon cancer    Migraine without aura, not intractable, without status migrainosus    Mixed hyperlipidemia    Stroke [facility])    Thyroid disease    UTI (urinary tract infection)    Vaginal prolapse    Vitamin D deficiency, unspecified     Past Surgical History:  Procedure Laterality Date   ABDOMINAL HYSTERECTOMY     APPENDECTOMY  1969   CATARACT EXTRACTION, BILATERAL Bilateral    right- 03/22/2018 and left 05/10/2018   COLONOSCOPY  05/12/2009   Colonic polyp status post polypectomy. Moderate predominanltly left colonic diverticulosis. Internal hemorrhoids   LEFT HEART CATH AND  CORONARY ANGIOGRAPHY N/A 10/14/2020   Procedure: LEFT HEART CATH AND CORONARY ANGIOGRAPHY;  Surgeon: Corky Crafts, MD;  Location: [facility] INVASIVE CV LAB;  Service: Cardiovascular;  Laterality: N/A;   LOOP RECORDER INSERTION N/A 08/29/2020   Procedure: LOOP RECORDER INSERTION;  Surgeon: Lanier Prude, MD;  Location: [facility] INVASIVE CV LAB;  Service: Cardiovascular;  Laterality: N/A;   MOHS SURGERY  2023   Forehead   OOPHORECTOMY Left     Current Medications: Current Meds  Medication Sig   atorvastatin (LIPITOR) 40 MG tablet TAKE 1 TABLET DAILY   cetirizine (ZYRTEC) 10 MG tablet TAKE 1 TABLET AT BEDTIME   Cholecalciferol (VITAMIN D3) 25 MCG (1000 UT) CAPS Take 1,000 Units by mouth in the morning.   clopidogrel (PLAVIX) 75 MG tablet TAKE 1 TABLET DAILY   D-MANNOSE PO Take by mouth in the morning and at bedtime.   denosumab (PROLIA) 60 MG/ML SOSY injection Inject 60 mg into the skin every 6 (six) months.   fluticasone-salmeterol (ADVAIR HFA) 115-21 MCG/ACT inhaler Inhale 2 puffs into the lungs 2 (two) times daily.   levothyroxine (SYNTHROID) 75 MCG tablet TAKE 1 TABLET DAILY   MAGNESIUM PO Take by mouth 3 (three) times daily. Pt says she takes 1 cap in morning and 2 caps at night   Melatonin-Pyridoxine (MELATIN PO) Take by mouth.   nebivolol (BYSTOLIC) 2.5 MG tablet TAKE  1 TABLET(2.5 MG) BY MOUTH DAILY   Polyvinyl Alcohol-Povidone (REFRESH OP) Place 1 drop into both eyes in the morning and at bedtime.   Probiotic Product (PROBIOTIC-10 PO) Take 1 capsule by mouth daily.     Allergies:   Shellfish allergy, Cephalosporins, Levaquin [levofloxacin], Nitrofuran derivatives, Azithromycin, Moxifloxacin hcl, and Phenobarbital   Social History   Socioeconomic History   Marital status: Widowed    Spouse name: Not on file   Number of children: 3   Years of education: Not on file   Highest education level: Not on file  Occupational History   Not on file  Tobacco Use   Smoking status:  Former   Smokeless tobacco: Never  Vaping Use   Vaping status: Never Used  Substance and Sexual Activity   Alcohol use: Yes    Comment: Drinks alcohol very infrequently. She typically consumes white wine.   Drug use: Never   Sexual activity: Not on file  Other Topics Concern   Not on file  Social History Narrative   Lives at [facility]   Social Determinants of Health   Financial Resource Strain: Not on file  Food Insecurity: No Food Insecurity (04/27/2021)   Hunger Vital Sign    Worried About Running Out of Food in the Last Year: Never true    Ran Out of Food in the Last Year: Never true  Transportation Needs: No Transportation Needs (04/27/2021)   PRAPARE - Administrator, Civil Service (Medical): No    Lack of Transportation (Non-Medical): No  Physical Activity: Not on file  Stress: Not on file  Social Connections: Not on file     Family History: The patient's family history includes Breast cancer in her mother; Colon cancer in her father; Coronary artery disease in an other family member; Hypertension in an other family member; Prostate cancer in her brother. There is no history of Esophageal cancer, Rectal cancer, or Stomach cancer.  ROS:   Review of Systems  Constitution: Negative for decreased appetite, fever and weight gain.  HENT: Negative for congestion, ear discharge, hoarse voice and sore throat.   Eyes: Negative for discharge, redness, vision loss in right eye and visual halos.  Cardiovascular: Negative for chest pain, dyspnea on exertion, leg swelling, orthopnea and palpitations.  Respiratory: Negative for cough, hemoptysis, shortness of breath and snoring.   Endocrine: Negative for heat intolerance and polyphagia.  Hematologic/Lymphatic: Negative for bleeding problem. Does not bruise/bleed easily.  Skin: Negative for flushing, nail changes, rash and suspicious lesions.  Musculoskeletal: Negative for arthritis, joint pain, muscle cramps,  myalgias, neck pain and stiffness.  Gastrointestinal: Negative for abdominal pain, bowel incontinence, diarrhea and excessive appetite.  Genitourinary: Negative for decreased libido, genital sores and incomplete emptying.  Neurological: Negative for brief paralysis, focal weakness, headaches and loss of balance.  Psychiatric/Behavioral: Negative for altered mental status, depression and suicidal ideas.  Allergic/Immunologic: Negative for HIV exposure and persistent infections.    EKGs/Labs/Other Studies Reviewed:    The following studies were reviewed today:   EKG:  The ekg ordered today demonstrates [facility] 10/14/2020   Mid LAD lesion is 40% stenosed.   Prox RCA lesion is 25% stenosed.   LV end diastolic pressure is low.   There is no aortic valve stenosis.  Medical therapy for mild to moderate CAD.  Continue aggressive secondary prevention.  Hydration post procedure for renal insufficiency.    Echo 08/28/2020 IMPRESSIONS   1. Left ventricular ejection fraction, by estimation, is 45  to 50%. The left ventricle has mildly decreased function. The left ventricle  demonstrates global hypokinesis. Left ventricular diastolic parameters are  consistent with Grade I diastolic  dysfunction (impaired relaxation).   2. Right ventricular systolic function is normal. The right ventricular  size is normal. There is normal pulmonary artery systolic pressure. The  estimated right ventricular systolic pressure is 20.3 mmHg.   3. The mitral valve is normal in structure. Trivial mitral valve  regurgitation. No evidence of mitral stenosis.   4. The aortic valve is tricuspid. Aortic valve regurgitation is mild. No  aortic stenosis is present.   5. Aortic dilatation noted. There is mild dilatation of the ascending  aorta, measuring 43 mm.   6. The inferior vena cava is normal in size with greater than 50%  respiratory variability, suggesting right atrial pressure of 3 mmHg.   FINDINGS   Left Ventricle:  Left ventricular ejection fraction, by estimation, is 45  to 50%. The left ventricle has mildly decreased function. The left  ventricle demonstrates global hypokinesis. The left ventricular internal  cavity size was normal in size. There is   no left ventricular hypertrophy. Left ventricular diastolic parameters  are consistent with Grade I diastolic dysfunction (impaired relaxation).   Right Ventricle: The right ventricular size is normal. No increase in  right ventricular wall thickness. Right ventricular systolic function is  normal. There is normal pulmonary artery systolic pressure. The tricuspid  regurgitant velocity is 2.08 m/s, and   with an assumed right atrial pressure of 3 mmHg, the estimated right  ventricular systolic pressure is 20.3 mmHg.   Left Atrium: Left atrial size was normal in size.   Right Atrium: Right atrial size was normal in size.   Pericardium: There is no evidence of pericardial effusion.   Mitral Valve: The mitral valve is normal in structure. Trivial mitral  valve regurgitation. No evidence of mitral valve stenosis.   Tricuspid Valve: The tricuspid valve is normal in structure. Tricuspid  valve regurgitation is trivial.   Aortic Valve: The aortic valve is tricuspid. Aortic valve regurgitation is  mild. Aortic regurgitation PHT measures 449 msec. No aortic stenosis is  present.   Pulmonic Valve: The pulmonic valve was normal in structure. Pulmonic valve  regurgitation is not visualized.   Aorta: The aortic root is normal in size and structure and aortic  dilatation noted. There is mild dilatation of the ascending aorta,  measuring 43 mm.   Venous: The inferior vena cava is normal in size with greater than 50%  respiratory variability, suggesting right atrial pressure of 3 mmHg.   IAS/Shunts: No atrial level shunt detected by color flow Doppler.       Recent Labs: 05/20/2022: ALT 28; BUN 32; Creatinine, Ser 1.45; Hemoglobin 13.6; Platelets 174;  Potassium 5.0; Sodium 140; TSH 2.960  Recent Lipid Panel    Component Value Date/Time   CHOL 164 05/20/2022 0943   TRIG 58 05/20/2022 0943   HDL 90 05/20/2022 0943   CHOLHDL 1.8 05/20/2022 0943   CHOLHDL 2.7 08/28/2020 0351   VLDL 17 08/28/2020 0351   LDLCALC 62 05/20/2022 0943    Physical Exam:    VS:  BP 132/84 (BP Location: Left Arm, Patient Position: Sitting, Cuff Size: Normal)   Pulse 78   Ht 5\' 3"  (1.6 m)   Wt 129 lb 6.4 oz (58.7 kg)   SpO2 98%   BMI 22.92 kg/m     Wt Readings from Last 3 Encounters:  10/05/22 129 lb  6.4 oz (58.7 kg)  06/04/22 137 lb (62.1 kg)  05/20/22 131 lb (59.4 kg)     GEN: Well nourished, well developed in no acute distress HEENT: Normal NECK: No JVD; No carotid bruits LYMPHATICS: No lymphadenopathy CARDIAC: S1S2 noted,RRR, no murmurs, rubs, gallops RESPIRATORY:  Clear to auscultation without rales, wheezing or rhonchi  ABDOMEN: Soft, non-tender, non-distended, +bowel sounds, no guarding. EXTREMITIES: No edema, No cyanosis, no clubbing MUSCULOSKELETAL:  No deformity  SKIN: Warm and dry NEUROLOGIC:  Alert and oriented x 3, non-focal PSYCHIATRIC:  Normal affect, good insight  ASSESSMENT:    1. Benign hypertension   2. Aortic aneurysm without rupture, unspecified portion of aorta (HCC)   3. Coronary artery disease involving native coronary artery of native heart without angina pectoris   4. Hyperlipidemia LDL goal <70     PLAN:     CAD-no anginal symptoms.  She seems to be doing well from a cardiovascular standpoint.. Hyperlipidemia - continue with current statin medication. Blood pressure is acceptable, continue with current antihypertensive regimen. Fully compensated.  No signs of heart failure.  Continue beta-blocker. With the coughing I suspect GERD may be playing a role here.  I will reach out to her PCP for hopefully to initiate trial of proton pump inhibitors the H2 blocker did not help her.  The patient is in agreement with  the above plan. The patient left the office in stable condition.  The patient will follow up in 1 year.   Medication Adjustments/Labs and Tests Ordered: Current medicines are reviewed at length with the patient today.  Concerns regarding medicines are outlined above.  No orders of the defined types were placed in this encounter.   No orders of the defined types were placed in this encounter.    Patient Instructions  Medication Instructions:  Your physician recommends that you continue on your current medications as directed. Please refer to the Current Medication list given to you today.  *If you need a refill on your cardiac medications before your next appointment, please call your pharmacy*   Lab Work: None   Testing/Procedures: None   Follow-Up: At [facility], you and your health needs are our priority.  As part of our continuing mission to provide you with exceptional heart care, we have created designated Provider Care Teams.  These Care Teams include your primary Cardiologist (physician) and Advanced Practice Providers (APPs -  Physician Assistants and Nurse Practitioners) who all work together to provide you with the care you need, when you need it.   Your next appointment:   1 year(s)  Provider:   Thomasene Ripple, DO    Adopting a Healthy Lifestyle.  Know what a healthy weight is for you (roughly BMI <25) and aim to maintain this   Aim for 7+ servings of fruits and vegetables daily   65-80+ fluid ounces of water or unsweet tea for healthy kidneys   Limit to max 1 drink of alcohol per day; avoid smoking/tobacco   Limit animal fats in diet for cholesterol and heart health - choose grass fed whenever available   Avoid highly processed foods, and foods high in saturated/trans fats   Aim for low stress - take time to unwind and care for your mental health   Aim for 150 min of moderate intensity exercise weekly for heart health, and weights twice weekly  for bone health   Aim for 7-9 hours of sleep daily   When it comes to diets, agreement about the perfect  plan isnt easy to find, even among the experts. Experts at the [facility] [employer] developed an idea known as the Healthy Eating Plate. Just imagine a plate divided into logical, healthy portions.   The emphasis is on diet quality:   Load up on vegetables and fruits - one-half of your plate: Aim for color and variety, and remember that potatoes dont count.   Go for whole grains - one-quarter of your plate: Whole wheat, barley, wheat berries, quinoa, oats, brown rice, and foods made with them. If you want pasta, go with whole wheat pasta.   Protein power - one-quarter of your plate: Fish, chicken, beans, and nuts are all healthy, versatile protein sources. Limit red meat.   The diet, however, does go beyond the plate, offering a few other suggestions.   Use healthy plant oils, such as olive, canola, soy, corn, sunflower and peanut. Check the labels, and avoid partially hydrogenated oil, which have unhealthy trans fats.   If youre thirsty, drink water. Coffee and tea are good in moderation, but skip sugary drinks and limit milk and dairy products to one or two daily servings.   The type of carbohydrate in the diet is more important than the amount. Some sources of carbohydrates, such as vegetables, fruits, whole grains, and beans-are healthier than others.   Finally, stay active  Signed, Thomasene Ripple, DO  10/06/2022 8:11 AM    [facility]

## 2022-10-05 NOTE — Patient Instructions (Signed)
Medication Instructions:  Your physician recommends that you continue on your current medications as directed. Please refer to the Current Medication list given to you today.  *If you need a refill on your cardiac medications before your next appointment, please call your pharmacy*   Lab Work: None   Testing/Procedures: None   Follow-Up: At [facility], you and your health needs are our priority.  As part of our continuing mission to provide you with exceptional heart care, we have created designated Provider Care Teams.  These Care Teams include your primary Cardiologist (physician) and Advanced Practice Providers (APPs -  Physician Assistants and Nurse Practitioners) who all work together to provide you with the care you need, when you need it.   Your next appointment:   1 year(s)  Provider:   Kardie Tobb, DO   

## 2022-10-15 ENCOUNTER — Other Ambulatory Visit: Payer: Self-pay | Admitting: Physician Assistant

## 2022-10-15 DIAGNOSIS — I10 Essential (primary) hypertension: Secondary | ICD-10-CM

## 2022-10-18 NOTE — Progress Notes (Signed)
Carelink Summary Report / Loop Recorder 

## 2022-11-02 DIAGNOSIS — D1801 Hemangioma of skin and subcutaneous tissue: Secondary | ICD-10-CM | POA: Diagnosis not present

## 2022-11-02 DIAGNOSIS — L821 Other seborrheic keratosis: Secondary | ICD-10-CM | POA: Diagnosis not present

## 2022-11-02 DIAGNOSIS — Z85828 Personal history of other malignant neoplasm of skin: Secondary | ICD-10-CM | POA: Diagnosis not present

## 2022-11-02 DIAGNOSIS — L814 Other melanin hyperpigmentation: Secondary | ICD-10-CM | POA: Diagnosis not present

## 2022-11-08 ENCOUNTER — Ambulatory Visit (INDEPENDENT_AMBULATORY_CARE_PROVIDER_SITE_OTHER): Payer: Medicare Other

## 2022-11-08 DIAGNOSIS — I6300 Cerebral infarction due to thrombosis of unspecified precerebral artery: Secondary | ICD-10-CM

## 2022-11-09 DIAGNOSIS — N390 Urinary tract infection, site not specified: Secondary | ICD-10-CM | POA: Diagnosis not present

## 2022-11-09 DIAGNOSIS — N8110 Cystocele, unspecified: Secondary | ICD-10-CM | POA: Diagnosis not present

## 2022-11-09 DIAGNOSIS — R339 Retention of urine, unspecified: Secondary | ICD-10-CM | POA: Diagnosis not present

## 2022-11-09 LAB — CUP PACEART REMOTE DEVICE CHECK
Date Time Interrogation Session: 20240913230613
Implantable Pulse Generator Implant Date: 20220708

## 2022-11-17 DIAGNOSIS — N1832 Chronic kidney disease, stage 3b: Secondary | ICD-10-CM | POA: Diagnosis not present

## 2022-11-22 DIAGNOSIS — N1832 Chronic kidney disease, stage 3b: Secondary | ICD-10-CM | POA: Diagnosis not present

## 2022-11-22 DIAGNOSIS — I129 Hypertensive chronic kidney disease with stage 1 through stage 4 chronic kidney disease, or unspecified chronic kidney disease: Secondary | ICD-10-CM | POA: Diagnosis not present

## 2022-11-22 NOTE — Progress Notes (Signed)
Carelink Summary Report / Loop Recorder 

## 2022-11-25 DIAGNOSIS — Z23 Encounter for immunization: Secondary | ICD-10-CM | POA: Diagnosis not present

## 2022-11-29 ENCOUNTER — Encounter: Payer: Self-pay | Admitting: Physician Assistant

## 2022-11-29 ENCOUNTER — Other Ambulatory Visit: Payer: Self-pay | Admitting: Physician Assistant

## 2022-11-29 ENCOUNTER — Ambulatory Visit (INDEPENDENT_AMBULATORY_CARE_PROVIDER_SITE_OTHER): Payer: Medicare Other | Admitting: Physician Assistant

## 2022-11-29 VITALS — BP 130/80 | HR 70 | Temp 98.0°F | Resp 18 | Ht 63.0 in | Wt 127.6 lb

## 2022-11-29 DIAGNOSIS — K219 Gastro-esophageal reflux disease without esophagitis: Secondary | ICD-10-CM

## 2022-11-29 DIAGNOSIS — Z23 Encounter for immunization: Secondary | ICD-10-CM | POA: Diagnosis not present

## 2022-11-29 DIAGNOSIS — E782 Mixed hyperlipidemia: Secondary | ICD-10-CM

## 2022-11-29 DIAGNOSIS — R252 Cramp and spasm: Secondary | ICD-10-CM

## 2022-11-29 DIAGNOSIS — E038 Other specified hypothyroidism: Secondary | ICD-10-CM

## 2022-11-29 DIAGNOSIS — E559 Vitamin D deficiency, unspecified: Secondary | ICD-10-CM | POA: Diagnosis not present

## 2022-11-29 DIAGNOSIS — I10 Essential (primary) hypertension: Secondary | ICD-10-CM | POA: Diagnosis not present

## 2022-11-29 DIAGNOSIS — N1832 Chronic kidney disease, stage 3b: Secondary | ICD-10-CM

## 2022-11-29 DIAGNOSIS — I129 Hypertensive chronic kidney disease with stage 1 through stage 4 chronic kidney disease, or unspecified chronic kidney disease: Secondary | ICD-10-CM

## 2022-11-29 DIAGNOSIS — J438 Other emphysema: Secondary | ICD-10-CM

## 2022-11-29 MED ORDER — PANTOPRAZOLE SODIUM 40 MG PO TBEC
40.0000 mg | DELAYED_RELEASE_TABLET | Freq: Every day | ORAL | 1 refills | Status: DC
Start: 2022-11-29 — End: 2022-11-29

## 2022-11-30 ENCOUNTER — Encounter: Payer: Self-pay | Admitting: Physician Assistant

## 2022-11-30 LAB — LIPID PANEL
Chol/HDL Ratio: 2.1 {ratio} (ref 0.0–4.4)
Cholesterol, Total: 166 mg/dL (ref 100–199)
HDL: 80 mg/dL (ref >39)
LDL Chol Calc (NIH): 75 mg/dL (ref 0–99)
Triglycerides: 57 mg/dL (ref 0–149)
VLDL Cholesterol Cal: 11 mg/dL (ref 5–40)

## 2022-11-30 LAB — COMPREHENSIVE METABOLIC PANEL
ALT: 37 [IU]/L — ABNORMAL HIGH (ref 0–32)
AST: 42 [IU]/L — ABNORMAL HIGH (ref 0–40)
Albumin: 3.9 g/dL (ref 3.7–4.7)
Alkaline Phosphatase: 116 [IU]/L (ref 44–121)
BUN/Creatinine Ratio: 26 (ref 12–28)
BUN: 33 mg/dL — ABNORMAL HIGH (ref 8–27)
Bilirubin Total: 0.7 mg/dL (ref 0.0–1.2)
CO2: 20 mmol/L (ref 20–29)
Calcium: 9.5 mg/dL (ref 8.7–10.3)
Chloride: 109 mmol/L — ABNORMAL HIGH (ref 96–106)
Creatinine, Ser: 1.26 mg/dL — ABNORMAL HIGH (ref 0.57–1.00)
Globulin, Total: 3 g/dL (ref 1.5–4.5)
Glucose: 97 mg/dL (ref 70–99)
Potassium: 5.5 mmol/L — ABNORMAL HIGH (ref 3.5–5.2)
Sodium: 142 mmol/L (ref 134–144)
Total Protein: 6.9 g/dL (ref 6.0–8.5)
eGFR: 41 mL/min/{1.73_m2} — ABNORMAL LOW (ref >59)

## 2022-11-30 LAB — MAGNESIUM: Magnesium: 2.3 mg/dL (ref 1.6–2.3)

## 2022-11-30 LAB — TSH: TSH: 2.47 u[IU]/mL (ref 0.450–4.500)

## 2022-11-30 NOTE — Progress Notes (Signed)
Established Patient Office Visit  Subjective:  Patient ID: Claudia Rasmussen, female    DOB: 12/12/1931  Age: 89 y.o. MRN: [id number]  CC:  Chief Complaint  Patient presents with   Medication Management    HPI Claudia Rasmussen presents for follow up hypothyroidism and chronic medical issues  [facility] presents with atrophy of thyroid (acquired).  Date of diagnosis 2002.  She is currently taking Synthroid, 75 mcg daily. Is due for labwork today    Pt presents with hyperlipidemia.  Date of diagnosis 09/2009.  Current treatment includes lipitor 40mg  qd Compliance with treatment has been good; she takes her medication as directed, maintains her low cholesterol diet, follows up as directed, and maintains her exercise regimen.  She denies experiencing any hypercholesterolemia related symptoms  Dx with age-related osteoporosis without current pathological fracture; this has been a problem for the past several years.  She describes the intensity of arthralgias as moderate.She is currently on Prolia injections   Pt with history of Chronic Kidney disease - is following with nephrology now on a every 6 month basis- states everything has been stable recently with most recent visit last week - had cbc, cmp and vit D levels done which were stable  Pt with history of emphysema - currently on advair and symptoms stable- denies chest pain or shortness of breath  Pt with complaints of coughing after eating.  She states is only occurs after eating and supper time seems to be the worst.  She occasionally has phlegm.  Denies actual trouble swallowing.  She did have endoscopy in the past 1-2 years by Dr Chales Abrahams which was normal  Pt with history of CAD and having a pacemaker - she follows with cardiology yearly  Pt follows with Dr Saddie Benders quarterly for recurrent UTIs - no symptoms today  Pt would like flu shot today .  Past Medical History:  Diagnosis Date   Age-related osteoporosis without current pathological  fracture    Arthritis    Atrophy of thyroid (acquired)    Benign hypertension 09/09/2020   Blepharitis 2021   Cholelithiases    Clostridium difficile infection    Diverticulitis    Diverticulitis of large intestine without perforation or abscess without bleeding    Family history of colon cancer    Migraine without aura, not intractable, without status migrainosus    Mixed hyperlipidemia    Stroke [facility])    Thyroid disease    UTI (urinary tract infection)    Vaginal prolapse    Vitamin D deficiency, unspecified     Past Surgical History:  Procedure Laterality Date   ABDOMINAL HYSTERECTOMY     APPENDECTOMY  1969   CATARACT EXTRACTION, BILATERAL Bilateral    right- 03/22/2018 and left 05/10/2018   COLONOSCOPY  05/12/2009   Colonic polyp status post polypectomy. Moderate predominanltly left colonic diverticulosis. Internal hemorrhoids   LEFT HEART CATH AND CORONARY ANGIOGRAPHY N/A 10/14/2020   Procedure: LEFT HEART CATH AND CORONARY ANGIOGRAPHY;  Surgeon: Corky Crafts, MD;  Location: [facility] INVASIVE CV LAB;  Service: Cardiovascular;  Laterality: N/A;   LOOP RECORDER INSERTION N/A 08/29/2020   Procedure: LOOP RECORDER INSERTION;  Surgeon: Lanier Prude, MD;  Location: [facility] INVASIVE CV LAB;  Service: Cardiovascular;  Laterality: N/A;   MOHS SURGERY  2023   Forehead   OOPHORECTOMY Left     Family History  Problem Relation Age of Onset   Breast cancer Mother    Colon cancer Father    Prostate cancer Brother  Coronary artery disease Other    Hypertension Other    Esophageal cancer Neg Hx    Rectal cancer Neg Hx    Stomach cancer Neg Hx     Social History   Socioeconomic History   Marital status: Widowed    Spouse name: Not on file   Number of children: 3   Years of education: Not on file   Highest education level: Bachelor's degree (e.g., BA, AB, BS)  Occupational History   Not on file  Tobacco Use   Smoking status: Former   Smokeless tobacco: Never  Vaping  Use   Vaping status: Never Used  Substance and Sexual Activity   Alcohol use: Yes    Comment: Drinks alcohol very infrequently. She typically consumes white wine.   Drug use: Never   Sexual activity: Not on file  Other Topics Concern   Not on file  Social History Narrative   L[facility] at [facility]   Social Determinants of Health   Financial Resource Strain: Low Risk  (11/25/2022)   Overall Financial Resource Strain (CARDIA)    Difficulty of Paying Living Expenses: Not hard at all  Food Insecurity: No Food Insecurity (11/25/2022)   Hunger Vital Sign    Worried About Running Out of Food in the Last Year: Never true    Ran Out of Food in the Last Year: Never true  Transportation Needs: No Transportation Needs (11/25/2022)   PRAPARE - Administrator, Civil Service (Medical): No    Lack of Transportation (Non-Medical): No  Physical Activity: Insufficiently Active (11/25/2022)   Exercise Vital Sign    Days of Exercise per Week: 7 days    Minutes of Exercise per Session: 10 min  Stress: No Stress Concern Present (11/25/2022)   [employer] of Occupational Health - Occupational Stress Questionnaire    Feeling of Stress : Not at all  Social Connections: Moderately Integrated (11/25/2022)   Social Connection and Isolation Panel [NHANES]    Frequency of Communication with Friends and Family: More than three times a week    Frequency of Social Gatherings with Friends and Family: More than three times a week    Attends Religious Services: More than 4 times per year    Active Member of [employer] or Organizations: Yes    Attends Banker Meetings: More than 4 times per year    Marital Status: Widowed  Catering manager Violence: Not on file     Current Outpatient Medications:    atorvastatin (LIPITOR) 40 MG tablet, TAKE 1 TABLET DAILY, Disp: 90 tablet, Rfl: 3   cetirizine (ZYRTEC) 10 MG tablet, TAKE 1 TABLET AT BEDTIME, Disp: 90 tablet, Rfl: 3    Cholecalciferol (VITAMIN D3) 25 MCG (1000 UT) CAPS, Take 1,000 Units by mouth in the morning., Disp: , Rfl:    clopidogrel (PLAVIX) 75 MG tablet, TAKE 1 TABLET DAILY, Disp: 90 tablet, Rfl: 3   D-MANNOSE PO, Take by mouth in the morning and at bedtime., Disp: , Rfl:    denosumab (PROLIA) 60 MG/ML SOSY injection, Inject 60 mg into the skin every 6 (six) months., Disp: , Rfl:    fluticasone-salmeterol (ADVAIR HFA) 115-21 MCG/ACT inhaler, Inhale 2 puffs into the lungs 2 (two) times daily., Disp: 1 each, Rfl: 12   levothyroxine (SYNTHROID) 75 MCG tablet, TAKE 1 TABLET DAILY, Disp: 90 tablet, Rfl: 3   MAGNESIUM PO, Take by mouth 3 (three) times daily. Pt says she takes 1 cap in morning and 2  caps at night, Disp: , Rfl:    Melatonin-Pyridoxine (MELATIN PO), Take by mouth., Disp: , Rfl:    nebivolol (BYSTOLIC) 2.5 MG tablet, TAKE 1 TABLET(2.5 MG) BY MOUTH DAILY, Disp: 90 tablet, Rfl: 3   Polyvinyl Alcohol-Povidone (REFRESH OP), Place 1 drop into both eyes in the morning and at bedtime., Disp: , Rfl:    Probiotic Product (PROBIOTIC-10 PO), Take 1 capsule by mouth daily., Disp: , Rfl:    nitroGLYCERIN (NITROSTAT) 0.4 MG SL tablet, Place 1 tablet (0.4 mg total) under the tongue every 5 (five) minutes as needed for chest pain., Disp: 25 tablet, Rfl: 3   pantoprazole (PROTONIX) 40 MG tablet, TAKE 1 TABLET(40 MG) BY MOUTH DAILY, Disp: 90 tablet, Rfl: 0   Allergies  Allergen Reactions   Shellfish Allergy Other (See Comments)    Itching, Hives Itching, Hives   Cephalosporins Other (See Comments)    Itching    Levaquin [Levofloxacin]    Nitrofuran Derivatives Other (See Comments)    Made her not feel good, caused tremors, increased creatnine   Azithromycin Rash   Moxifloxacin Hcl Rash   Phenobarbital Rash   CONSTITUTIONAL: Negative for chills, fatigue, fever, unintentional weight gain and unintentional weight loss.  E/N/T: Negative for ear pain, nasal congestion and sore throat.  CARDIOVASCULAR:  Negative for chest pain, dizziness, palpitations and pedal edema.  RESPIRATORY: Negative for recent cough and dyspnea.  GASTROINTESTINAL: see HPI INTEGUMENTARY: Negative for rash.  NEUROLOGICAL: Negative for dizziness and headaches.  PSYCHIATRIC: Negative for sleep disturbance and to question depression screen.  Negative for depression, negative for anhedonia.        Objective:  PHYSICAL EXAM:   VS: BP 130/80   Pulse 70   Temp 98 F (36.7 C) (Temporal)   Resp 18   Ht 5\' 3"  (1.6 m)   Wt 127 lb 9.6 oz (57.9 kg)   SpO2 92%   BMI 22.60 kg/m   GEN: Well nourished, well developed, in no acute distress   Cardiac: RRR; no murmurs, rubs, or gallops,no edema -  Respiratory:  normal respiratory rate and pattern with no distress - normal breath sounds with no rales, rhonchi, wheezes or rubs  MS: no deformity or atrophy  Skin: warm and dry, no rash  Neuro:  Alert and Oriented x 3, - CN II-Xii grossly intact Psych: euthymic mood, appropriate affect and demeanor   Office Visit on 11/29/2022  Component Date Value Ref Range Status   Glucose 11/29/2022 97  70 - 99 mg/dL Final   BUN 00/93/8182 33 (H)  8 - 27 mg/dL Final   Creatinine, Ser 11/29/2022 1.26 (H)  0.57 - 1.00 mg/dL Final   eGFR 99/37/1696 41 (L)  >59 mL/min/1.73 Final   BUN/Creatinine Ratio 11/29/2022 26  12 - 28 Final   Sodium 11/29/2022 142  134 - 144 mmol/L Final   Potassium 11/29/2022 5.5 (H)  3.5 - 5.2 mmol/L Final   Chloride 11/29/2022 109 (H)  96 - 106 mmol/L Final   CO2 11/29/2022 20  20 - 29 mmol/L Final   Calcium 11/29/2022 9.5  8.7 - 10.3 mg/dL Final   Total Protein 78/93/8101 6.9  6.0 - 8.5 g/dL Final   Albumin 75/11/2583 3.9  3.7 - 4.7 g/dL Final   Globulin, Total 11/29/2022 3.0  1.5 - 4.5 g/dL Final   Bilirubin Total 11/29/2022 0.7  0.0 - 1.2 mg/dL Final   Alkaline Phosphatase 11/29/2022 116  44 - 121 IU/L Final   AST 11/29/2022 42 (H)  0 - 40 IU/L Final   ALT 11/29/2022 37 (H)  0 - 32 IU/L Final   TSH  11/29/2022 2.470  0.450 - 4.500 uIU/mL Final   Cholesterol, Total 11/29/2022 166  100 - 199 mg/dL Final   Triglycerides 03/47/4259 57  0 - 149 mg/dL Final   HDL 56/38/7564 80  >39 mg/dL Final   VLDL Cholesterol Cal 11/29/2022 11  5 - 40 mg/dL Final   LDL Chol Calc (NIH) 11/29/2022 75  0 - 99 mg/dL Final   Chol/HDL Ratio 11/29/2022 2.1  0.0 - 4.4 ratio Final   Comment:                                   T. Chol/HDL Ratio                                             Men  Women                               1/2 Avg.Risk  3.4    3.3                                   Avg.Risk  5.0    4.4                                2X Avg.Risk  9.6    7.1                                3X Avg.Risk 23.4   11.0    Magnesium 11/29/2022 2.3  1.6 - 2.3 mg/dL Final    Health Maintenance Due  Topic Date Due   DTaP/Tdap/Td (1 - Tdap) Never done   Medicare Annual Wellness (AWV)  12/14/2019     There are no preventive care reminders to display for this patient.  Lab Results  Component Value Date   TSH 2.470 11/29/2022   Lab Results  Component Value Date   WBC 6.2 05/20/2022   HGB 13.6 05/20/2022   HCT 41.8 05/20/2022   MCV 94 05/20/2022   PLT 174 05/20/2022   Lab Results  Component Value Date   NA 142 11/29/2022   K 5.5 (H) 11/29/2022   CO2 20 11/29/2022   GLUCOSE 97 11/29/2022   BUN 33 (H) 11/29/2022   CREATININE 1.26 (H) 11/29/2022   BILITOT 0.7 11/29/2022   ALKPHOS 116 11/29/2022   AST 42 (H) 11/29/2022   ALT 37 (H) 11/29/2022   PROT 6.9 11/29/2022   ALBUMIN 3.9 11/29/2022   CALCIUM 9.5 11/29/2022   ANIONGAP 9 10/06/2021   EGFR 41 (L) 11/29/2022   Lab Results  Component Value Date   CHOL 166 11/29/2022   Lab Results  Component Value Date   HDL 80 11/29/2022   Lab Results  Component Value Date   LDLCALC 75 11/29/2022   Lab Results  Component Value Date   TRIG 57 11/29/2022   Lab Results  Component Value Date   CHOLHDL 2.1 11/29/2022   Lab Results  Component  Value Date    HGBA1C 5.4 08/28/2020      Assessment & Plan:   Problem List Items Addressed This Visit       Endocrine   Other specified hypothyroidism - Primary   Relevant Orders   TSH Continue current meds     Genitourinary   Stage 3b chronic kidney disease (HCC) Continue follow up with nephrology     Other   Mixed hyperlipidemia   Relevant Orders   Lipid panel Continue meds   Vit D def Recent lab normal Continue supplement  Emphysema Continue advair  Hx CAD and pacemaker Follow up with cardiology as directed      Osteoporosis Continue prolia  GERD/cough Rx for protonix      Need for flu vaccine Fluad given             Meds ordered this encounter  Medications   DISCONTD: pantoprazole (PROTONIX) 40 MG tablet    Sig: Take 1 tablet (40 mg total) by mouth daily.    Dispense:  30 tablet    Refill:  1    Order Specific Question:   Supervising Provider    AnswerCorey Harold     Follow-up: Return in about 6 months (around 05/30/2023) for chronic fasting follow-up - MCR well with Kim by phone.    SARA R Hashim Eichhorst, [name]

## 2022-12-12 LAB — CUP PACEART REMOTE DEVICE CHECK
Date Time Interrogation Session: 20241019230358
Implantable Pulse Generator Implant Date: 20220708

## 2022-12-13 ENCOUNTER — Ambulatory Visit: Payer: Medicare Other

## 2022-12-13 DIAGNOSIS — Z8673 Personal history of transient ischemic attack (TIA), and cerebral infarction without residual deficits: Secondary | ICD-10-CM

## 2022-12-15 ENCOUNTER — Telehealth: Payer: Self-pay

## 2022-12-15 NOTE — Telephone Encounter (Signed)
Patient called stating that she was supposed to call and let you know if the pantoprazole was working for her. She states that it is not helping and its still the same nothing has changed or gotten better with her GERD/Cough. Please advise.

## 2022-12-15 NOTE — Telephone Encounter (Signed)
Recommend to continue the medication and would make follow up appt with Dr Chales Abrahams for further evaluation - does she want to call and schedule or have [place] call?

## 2022-12-16 ENCOUNTER — Other Ambulatory Visit: Payer: Self-pay | Admitting: Physician Assistant

## 2022-12-16 NOTE — Telephone Encounter (Signed)
Mort Sawyers office appointment made for 01/04/23 at 11:20 AM patient made aware.

## 2022-12-27 ENCOUNTER — Ambulatory Visit (INDEPENDENT_AMBULATORY_CARE_PROVIDER_SITE_OTHER): Payer: Medicare Other | Admitting: Physician Assistant

## 2022-12-27 ENCOUNTER — Ambulatory Visit: Payer: Medicare Other

## 2022-12-27 ENCOUNTER — Encounter: Payer: Self-pay | Admitting: Physician Assistant

## 2022-12-27 DIAGNOSIS — Z87891 Personal history of nicotine dependence: Secondary | ICD-10-CM | POA: Insufficient documentation

## 2022-12-27 DIAGNOSIS — Z0000 Encounter for general adult medical examination without abnormal findings: Secondary | ICD-10-CM | POA: Diagnosis not present

## 2022-12-27 NOTE — Patient Instructions (Signed)

## 2022-12-27 NOTE — Progress Notes (Signed)
Subjective:   Claudia Rasmussen is a 89 y.o. female who presents for an Initial Medicare Annual Wellness Visit.  Visit Complete: Virtual I connected with  Claudia Rasmussen on 12/27/22 by a audio enabled telemedicine application and verified that I am speaking with the correct person using two identifiers.  Patient Location: Home  Provider Location: Office/Clinic  I discussed the limitations of evaluation and management by telemedicine. The patient expressed understanding and agreed to proceed.  Vital Signs: Because this visit was a virtual/telehealth visit, some criteria may be missing or patient reported. Any vitals not documented were not able to be obtained and vitals that have been documented are patient reported.  Patient Medicare AWV questionnaire was completed by the patient on 12/27/2022; I have confirmed that all information answered by patient is correct and no changes since this date.        Objective:    There were no vitals filed for this visit. There is no height or weight on file to calculate BMI.     01/11/2022   11:50 AM 10/06/2021   10:15 AM 09/10/2020   11:41 AM 08/28/2020    8:55 PM 09/29/2018   12:59 PM 12/20/2015    9:09 AM  Advanced Directives  Does Patient Have a Medical Advance Directive? Yes Yes Yes Yes Yes No  Type of Advance Directive   Living will;Healthcare Power of Attorney Living will;Healthcare Power of [employer] Power of [place];Living will   Does patient want to make changes to medical advance directive? No - Patient declined  No - Patient declined No - Patient declined    Copy of Healthcare Power of Attorney in Chart?    No - copy requested      Current Medications (verified) Outpatient Encounter Medications as of 12/27/2022  Medication Sig   atorvastatin (LIPITOR) 40 MG tablet TAKE 1 TABLET DAILY   cetirizine (ZYRTEC) 10 MG tablet TAKE 1 TABLET AT BEDTIME   Cholecalciferol (VITAMIN D3) 25 MCG (1000 UT) CAPS Take 1,000 Units by mouth  in the morning.   clopidogrel (PLAVIX) 75 MG tablet TAKE 1 TABLET DAILY   D-MANNOSE PO Take by mouth in the morning and at bedtime.   denosumab (PROLIA) 60 MG/ML SOSY injection Inject 60 mg into the skin every 6 (six) months.   fluticasone-salmeterol (ADVAIR HFA) 115-21 MCG/ACT inhaler Inhale 2 puffs into the lungs 2 (two) times daily.   levothyroxine (SYNTHROID) 75 MCG tablet TAKE 1 TABLET DAILY   MAGNESIUM PO Take by mouth 3 (three) times daily. Pt says she takes 1 cap in morning and 2 caps at night   Melatonin-Pyridoxine (MELATIN PO) Take by mouth.   nebivolol (BYSTOLIC) 2.5 MG tablet TAKE 1 TABLET(2.5 MG) BY MOUTH DAILY   nitroGLYCERIN (NITROSTAT) 0.4 MG SL tablet Place 1 tablet (0.4 mg total) under the tongue every 5 (five) minutes as needed for chest pain.   pantoprazole (PROTONIX) 40 MG tablet TAKE 1 TABLET(40 MG) BY MOUTH DAILY   Polyvinyl Alcohol-Povidone (REFRESH OP) Place 1 drop into both eyes in the morning and at bedtime.   Probiotic Product (PROBIOTIC-10 PO) Take 1 capsule by mouth daily.   No facility-administered encounter medications on file as of 12/27/2022.    Allergies (verified) Shellfish allergy, Cephalosporins, Levaquin [levofloxacin], Nitrofuran derivatives, Azithromycin, Moxifloxacin hcl, and Phenobarbital   History: Past Medical History:  Diagnosis Date   Age-related osteoporosis without current pathological fracture    Arthritis    Atrophy of thyroid (acquired)    Benign hypertension 09/09/2020  Blepharitis 2021   Cholelithiases    Clostridium difficile infection    Diverticulitis    Diverticulitis of large intestine without perforation or abscess without bleeding    Family history of colon cancer    Migraine without aura, not intractable, without status migrainosus    Mixed hyperlipidemia    Stroke [facility])    Thyroid disease    UTI (urinary tract infection)    Vaginal prolapse    Vitamin D deficiency, unspecified    Past Surgical History:  Procedure  Laterality Date   ABDOMINAL HYSTERECTOMY     APPENDECTOMY  1969   CATARACT EXTRACTION, BILATERAL Bilateral    right- 03/22/2018 and left 05/10/2018   COLONOSCOPY  05/12/2009   Colonic polyp status post polypectomy. Moderate predominanltly left colonic diverticulosis. Internal hemorrhoids   LEFT HEART CATH AND CORONARY ANGIOGRAPHY N/A 10/14/2020   Procedure: LEFT HEART CATH AND CORONARY ANGIOGRAPHY;  Surgeon: Corky Crafts, MD;  Location: [facility] INVASIVE CV LAB;  Service: Cardiovascular;  Laterality: N/A;   LOOP RECORDER INSERTION N/A 08/29/2020   Procedure: LOOP RECORDER INSERTION;  Surgeon: Lanier Prude, MD;  Location: [facility] INVASIVE CV LAB;  Service: Cardiovascular;  Laterality: N/A;   MOHS SURGERY  2023   Forehead   OOPHORECTOMY Left    Family History  Problem Relation Age of Onset   Breast cancer Mother    Colon cancer Father    Prostate cancer Brother    Coronary artery disease Other    Hypertension Other    Esophageal cancer Neg Hx    Rectal cancer Neg Hx    Stomach cancer Neg Hx    Social History   Socioeconomic History   Marital status: Widowed    Spouse name: Not on file   Number of children: 3   Years of education: Not on file   Highest education level: Bachelor's degree (e.g., BA, AB, BS)  Occupational History   Not on file  Tobacco Use   Smoking status: Former   Smokeless tobacco: Never  Vaping Use   Vaping status: Never Used  Substance and Sexual Activity   Alcohol use: Yes    Comment: Drinks alcohol very infrequently. She typically consumes white wine.   Drug use: Never   Sexual activity: Not on file  Other Topics Concern   Not on file  Social History Narrative   [facility]   Social Determinants of Health   Financial Resource Strain: Low Risk  (11/25/2022)   Overall Financial Resource Strain (CARDIA)    Difficulty of Paying Living Expenses: Not hard at all  Food Insecurity: No Food Insecurity (11/25/2022)   Hunger Vital  Sign    Worried About Running Out of Food in the Last Year: Never true    Ran Out of Food in the Last Year: Never true  Transportation Needs: No Transportation Needs (11/25/2022)   PRAPARE - Administrator, Civil Service (Medical): No    Lack of Transportation (Non-Medical): No  Physical Activity: Insufficiently Active (11/25/2022)   Exercise Vital Sign    Days of Exercise per Week: 7 days    Minutes of Exercise per Session: 10 min  Stress: No Stress Concern Present (11/25/2022)   [employer] of Occupational Health - Occupational Stress Questionnaire    Feeling of Stress : Not at all  Social Connections: Moderately Integrated (11/25/2022)   Social Connection and Isolation Panel [NHANES]    Frequency of Communication with Friends and Family: More than three times  a week    Frequency of Social Gatherings with Friends and Family: More than three times a week    Attends Religious Services: More than 4 times per year    Active Member of Clubs or Organizations: Yes    Attends Banker Meetings: More than 4 times per year    Marital Status: Widowed    Tobacco Counseling Counseling given: Not Answered   Clinical Intake:              How often do you need to have someone help you when you read instructions, pamphlets, or other written materials from your doctor or pharmacy?: (P) 1 - Never         Activities of Daily Living    12/27/2022    9:50 AM 12/23/2022   10:38 AM  In your present state of health, do you have any difficulty performing the following activities:  Hearing? 0 0  Vision? 0 0  Difficulty concentrating or making decisions? 0 0  Walking or climbing stairs? 1 1  Dressing or bathing? 0 0  Doing errands, shopping? 0 0  Preparing Food and eating ? N N  Using the Toilet? N N  In the past six months, have you accidently leaked urine? Y Y  Do you have problems with loss of bowel control? N N  Managing your Medications? N N  Managing  your Finances? N N  Housekeeping or managing your Housekeeping? N N    Patient Care Team: Marianne Sofia, [name] as PCP - General (Physician Assistant) Thomasene Ripple, DO as PCP - Cardiology (Cardiology)  Indicate any recent Medical Services you may have received from other than [facility] providers in the past year (date may be approximate).     Assessment:   This is a routine wellness examination for Johnnie.  Hearing/Vision screen No results found.   Goals Addressed   None   Depression Screen    11/29/2022    9:01 AM 05/20/2022    9:07 AM 01/11/2022   11:51 AM 01/23/2021    2:23 PM 10/07/2020   10:20 AM 09/10/2020   11:36 AM 12/19/2019    8:30 AM  PHQ 2/9 Scores  PHQ - 2 Score 0 0 0 0 0 1 0  Exception Documentation      --     Fall Risk    12/27/2022    9:50 AM 12/23/2022   10:38 AM 11/29/2022    9:02 AM 05/20/2022    9:07 AM 01/11/2022   11:51 AM  Fall Risk   Falls in the past year? 1 1 1  0 0  Number falls in past yr: 0 0 0 0 0  Injury with Fall? 0 0 0 0 0  Risk for fall due to :    No Fall Risks No Fall Risks  Follow up    Falls evaluation completed Falls evaluation completed;Education provided    MEDICARE RISK AT HOME: Medicare Risk at Home Any stairs in or around the home?: (P) No Home free of loose throw rugs in walkways, pet beds, electrical cords, etc?: (P) Yes Adequate lighting in your home to reduce risk of falls?: (P) Yes Life alert?: (P) Yes Use of a cane, walker or w/c?: (P) No Grab bars in the bathroom?: (P) Yes Shower chair or bench in shower?: (P) Yes Elevated toilet seat or a handicapped toilet?: (P) Yes  TIMED UP AND GO:  Was the test performed? No    Cognitive Function:  01/11/2022   11:53 AM  6CIT Screen  What Year? 0 points  What month? 0 points  What time? 0 points  Count back from 20 0 points  Months in reverse 2 points  Repeat phrase 2 points  Total Score 4 points    Immunizations Immunization History  Administered Date(s)  Administered   Fluad Quad(high Dose 65+) 11/20/2021   Fluad Trivalent(High Dose 65+) 11/29/2022   Influenza, High Dose Seasonal PF 11/28/2018, 11/20/2019, 12/09/2020   Influenza-Unspecified 10/23/2017   Moderna Covid-19 Fall Seasonal Vaccine 56yrs & older 12/02/2021   Moderna Sars-Covid-2 Vaccination 02/20/2019, 03/20/2019, 12/18/2019, 06/11/2020   Pfizer Covid-19 Vaccine Bivalent Booster 44yrs & up 11/05/2020   Pneumococcal Conjugate-13 11/18/2014   Pneumococcal Polysaccharide-23 12/14/2018   Unspecified SARS-COV-2 Vaccination 07/15/2021, 11/25/2022   Zoster, Live 02/23/2011    TDAP status: Due, Education has been provided regarding the importance of this vaccine. Advised may receive this vaccine at local pharmacy or Health Dept. Aware to provide a copy of the vaccination record if obtained from local pharmacy or Health Dept. Verbalized acceptance and understanding.   Flu Vaccine status: Up to date  Pneumococcal vaccine status: Up to date  Covid-19 vaccine status: Completed vaccines  Qualifies for Shingles Vaccine? Yes   Zostavax completed Yes   Shingrix Completed?: No.    Education has been provided regarding the importance of this vaccine. Patient has been advised to call insurance company to determine out of pocket expense if they have not yet received this vaccine. Advised may also receive vaccine at local pharmacy or Health Dept. Verbalized acceptance and understanding.  Screening Tests Health Maintenance  Topic Date Due   DTaP/Tdap/Td (1 - Tdap) Never done   COVID-19 Vaccine (9 - 2023-24 season) 01/20/2023   Medicare Annual Wellness (AWV)  12/27/2023   Pneumonia Vaccine 31+ Years old  Completed   INFLUENZA VACCINE  Completed   DEXA SCAN  Completed   HPV VACCINES  Aged Out   Zoster Vaccines- Shingrix  Discontinued    Health Maintenance  Health Maintenance Due  Topic Date Due   DTaP/Tdap/Td (1 - Tdap) Never done    Colorectal cancer screening: No longer required.    Mammogram status: No longer required due to Patient discretion.  Bone Density status: Completed 06/2021. Results reflect: Bone density results: OSTEOPOROSIS. Repeat every 2 years.  Lung Cancer Screening: (Low Dose CT Chest recommended if Age 70-80 years, 20 pack-year currently smoking OR have quit w/in 15years.) does not qualify due to age.   Lung Cancer Screening Referral: Patient is controlled symptomatically on inhailer currenlty  Additional Screening:  Hepatitis C Screening: does not qualify; Completed Age  Vision Screening: Recommended annual ophthalmology exams for early detection of glaucoma and other disorders of the eye. Is the patient up to date with their annual eye exam?  Yes  Who is the provider or what is the name of the office in which the patient attends annual eye exams?  Dr. Bradley Ferris, [facility]. Last one in 06/2022, Will have another next year If pt is not established with a provider, would they like to be referred to a provider to establish care? No .   Dental Screening: Recommended annual dental exams for proper oral hygiene  Diabetic Foot Exam: Diabetic Foot Exam: Completed At last chronic visit.  Community Resource Referral / Chronic Care Management: CRR required this visit?  No   CCM required this visit?  No     Plan:  I have personally reviewed and noted the following in the patient's chart:   Medical and social history Use of alcohol, tobacco or illicit drugs  Current medications and supplements including opioid prescriptions. Patient is not currently taking opioid prescriptions. Functional ability and status Nutritional status Physical activity Advanced directives List of other physicians Hospitalizations, surgeries, and ER visits in previous 12 months Vitals Screenings to include cognitive, depression, and falls Referrals and appointments  In addition, I have reviewed and discussed with patient certain  preventive protocols, quality metrics, and best practice recommendations. A written personalized care plan for preventive services as well as general preventive health recommendations were provided to patient.     Langley Gauss, [place]   12/27/2022   After Visit Summary: (MyChart) Due to this being a telephonic visit, the after visit summary with patients personalized plan was offered to patient via MyChart   Nurse Notes: Patient had no major complaints or concerns today. Will look into possibly getting her Tdap vaccine at her next chronic visit in 05/2023

## 2022-12-30 NOTE — Progress Notes (Signed)
Carelink Summary Report / Loop Recorder 

## 2023-01-04 ENCOUNTER — Ambulatory Visit (INDEPENDENT_AMBULATORY_CARE_PROVIDER_SITE_OTHER)
Admission: RE | Admit: 2023-01-04 | Discharge: 2023-01-04 | Disposition: A | Discharge status: Home / Self Care | Payer: Medicare Other | Source: Ambulatory Visit | Attending: Gastroenterology | Admitting: Gastroenterology

## 2023-01-04 ENCOUNTER — Ambulatory Visit (INDEPENDENT_AMBULATORY_CARE_PROVIDER_SITE_OTHER): Payer: Medicare Other | Admitting: Gastroenterology

## 2023-01-04 ENCOUNTER — Encounter: Payer: Self-pay | Admitting: Gastroenterology

## 2023-01-04 VITALS — BP 118/68 | HR 71 | Ht 63.0 in | Wt 130.0 lb

## 2023-01-04 DIAGNOSIS — K449 Diaphragmatic hernia without obstruction or gangrene: Secondary | ICD-10-CM | POA: Diagnosis not present

## 2023-01-04 DIAGNOSIS — J449 Chronic obstructive pulmonary disease, unspecified: Secondary | ICD-10-CM

## 2023-01-04 DIAGNOSIS — Z8709 Personal history of other diseases of the respiratory system: Secondary | ICD-10-CM | POA: Diagnosis not present

## 2023-01-04 DIAGNOSIS — R053 Chronic cough: Secondary | ICD-10-CM | POA: Diagnosis not present

## 2023-01-04 DIAGNOSIS — K219 Gastro-esophageal reflux disease without esophagitis: Secondary | ICD-10-CM

## 2023-01-04 NOTE — Progress Notes (Signed)
Chief Complaint: Abdominal pain.  Referring Provider:  Marianne Sofia, [name]      ASSESSMENT AND PLAN;   #1. GERD with HH with mu. Neg CT AP 09/2018  #2. COPD with H/O remote smoking with occ wheezing.  Plan:  -Change Protonix 40mg  po 1/2 hr before supper -[facility] PA/lat today -Recommend pulm consultation (she will get in touch with Kennon Rounds) -Raise head of the bed 30 to 45 degrees   HPI:    Claudia Rasmussen is a 89 y.o. female  H/O C diff 2012, remote TIA on plavix, asymptomatic cholelithiasis, osteoarthritis, HLD, UTI (followed by Dr. Saddie Benders), COPD  Mostly having problems after supper Would have thick yellow mucus in the throat which she has to clear Feels like that she at times is "choking on mucus" Had recent bronchitis No problems for the rest of the day.  Does get short of breath and has wheezing at times.  "Cannot sing anymore"  Has been given Protonix which she has been taking before breakfast.  Has helped a little.  No fever chills or night sweats. No significant heartburn. No dysphagia to foods. No diarrhea or constipation.    Most recent EGD January 2022 - 3 cm hiatal hernia. - Atrophic gastritis. Bx- neg. - Minimal scalloping of duodenal mucosa ? Importance ( biopsied- NEG for celiac)    Past GI procedures: CT AP with contrast 09/2018 1. Mild diffuse colonic wall thickening suggesting a nonspecific colitis. There is mild increased haziness in the mesentery of the right lower quadrant. Appendix not clearly identified. 2. Cholelithiasis.  Colonoscopy 04/2009: Colonic polyps s/p polypectomy, moderate predominantly left colonic diverticulosis, internal hemorrhoids. Bx- TA/hyperplastic.  No need to repeat d/t age.  SH: Retired Runner, broadcasting/film/video Past Medical History:  Diagnosis Date   Age-related osteoporosis without current pathological fracture    Arthritis    Atrophy of thyroid (acquired)    Benign hypertension 09/09/2020   Blepharitis 2021   Cholelithiases     Clostridium difficile infection    Diverticulitis    Diverticulitis of large intestine without perforation or abscess without bleeding    Family history of colon cancer    Migraine without aura, not intractable, without status migrainosus    Mixed hyperlipidemia    Stroke [facility])    Thyroid disease    UTI (urinary tract infection)    Vaginal prolapse    Vitamin D deficiency, unspecified     Past Surgical History:  Procedure Laterality Date   ABDOMINAL HYSTERECTOMY     APPENDECTOMY  1969   CATARACT EXTRACTION, BILATERAL Bilateral    right- 03/22/2018 and left 05/10/2018   COLONOSCOPY  05/12/2009   Colonic polyp status post polypectomy. Moderate predominanltly left colonic diverticulosis. Internal hemorrhoids   LEFT HEART CATH AND CORONARY ANGIOGRAPHY N/A 10/14/2020   Procedure: LEFT HEART CATH AND CORONARY ANGIOGRAPHY;  Surgeon: Corky Crafts, MD;  Location: [facility] INVASIVE CV LAB;  Service: Cardiovascular;  Laterality: N/A;   LOOP RECORDER INSERTION N/A 08/29/2020   Procedure: LOOP RECORDER INSERTION;  Surgeon: Lanier Prude, MD;  Location: [facility] INVASIVE CV LAB;  Service: Cardiovascular;  Laterality: N/A;   MOHS SURGERY  2023   Forehead   OOPHORECTOMY Left     Family History  Problem Relation Age of Onset   Breast cancer Mother    Colon cancer Father    Prostate cancer Brother    Coronary artery disease Other    Hypertension Other    Esophageal cancer Neg Hx    Rectal cancer  Neg Hx    Stomach cancer Neg Hx     Social History   Tobacco Use   Smoking status: Former   Smokeless tobacco: Never  Vaping Use   Vaping status: Never Used  Substance Use Topics   Alcohol use: Yes    Comment: Drinks alcohol very infrequently. She typically consumes white wine.   Drug use: Never    Current Outpatient Medications  Medication Sig Dispense Refill   atorvastatin (LIPITOR) 40 MG tablet TAKE 1 TABLET DAILY 90 tablet 3   cetirizine (ZYRTEC) 10 MG tablet TAKE 1 TABLET AT  BEDTIME 90 tablet 3   Cholecalciferol (VITAMIN D3) 25 MCG (1000 UT) CAPS Take 1,000 Units by mouth in the morning.     clopidogrel (PLAVIX) 75 MG tablet TAKE 1 TABLET DAILY 90 tablet 3   D-MANNOSE PO Take by mouth in the morning and at bedtime.     denosumab (PROLIA) 60 MG/ML SOSY injection Inject 60 mg into the skin every 6 (six) months.     fluticasone-salmeterol (ADVAIR HFA) 115-21 MCG/ACT inhaler Inhale 2 puffs into the lungs 2 (two) times daily. 1 each 12   levothyroxine (SYNTHROID) 75 MCG tablet TAKE 1 TABLET DAILY 90 tablet 3   MAGNESIUM PO Take by mouth 3 (three) times daily. Pt says she takes 1 cap in morning and 2 caps at night     Melatonin-Pyridoxine (MELATIN PO) Take by mouth.     nebivolol (BYSTOLIC) 2.5 MG tablet TAKE 1 TABLET(2.5 MG) BY MOUTH DAILY 90 tablet 3   pantoprazole (PROTONIX) 40 MG tablet TAKE 1 TABLET(40 MG) BY MOUTH DAILY 90 tablet 0   Polyvinyl Alcohol-Povidone (REFRESH OP) Place 1 drop into both eyes in the morning and at bedtime.     Probiotic Product (PROBIOTIC-10 PO) Take 1 capsule by mouth daily.     nitroGLYCERIN (NITROSTAT) 0.4 MG SL tablet Place 1 tablet (0.4 mg total) under the tongue every 5 (five) minutes as needed for chest pain. 25 tablet 3   No current facility-administered medications for this visit.    Allergies  Allergen Reactions   Shellfish Allergy Other (See Comments)    Itching, Hives Itching, Hives   Cephalosporins Other (See Comments)    Itching    Levaquin [Levofloxacin]    Nitrofuran Derivatives Other (See Comments)    Made her not feel good, caused tremors, increased creatnine   Azithromycin Rash   Moxifloxacin Hcl Rash   Phenobarbital Rash    Review of Systems:  neg     Physical Exam:    BP 118/68   Pulse 71   Ht 5\' 3"  (1.6 m)   Wt 130 lb (59 kg)   BMI 23.03 kg/m  Wt Readings from Last 3 Encounters:  01/04/23 130 lb (59 kg)  11/29/22 127 lb 9.6 oz (57.9 kg)  10/05/22 129 lb 6.4 oz (58.7 kg)   Constitutional:   Well-developed, in no acute distress. Psychiatric: Normal mood and affect. Behavior is normal. HEENT: Pupils normal.  Conjunctivae are normal. No scleral icterus. Cardiovascular: Normal rate, regular rhythm. No edema Pulmonary/chest: Bilateral decreased breath sounds with occasional wheezing. Abdominal: Soft, nondistended. Nontender. Bowel sounds active throughout. There are no masses palpable. No hepatomegaly. Rectal: Deferred Neurological: Alert and oriented to person place and time. Skin: Skin is warm and dry. No rashes noted.  Data Reviewed: I have personally reviewed following labs and imaging studies  CBC:    Latest Ref Rng & Units 05/20/2022    9:43 AM 11/20/2021  9:01 AM 10/06/2021   10:23 AM  CBC  WBC 3.4 - 10.8 x10E3/uL 6.2  6.3  6.4   Hemoglobin 11.1 - 15.9 g/dL 16.1  09.6  04.5   Hematocrit 34.0 - 46.6 % 41.8  40.8  39.3   Platelets 150 - 450 x10E3/uL 174  183  219       Edman Circle, MD 01/04/2023, 11:39 AM  Cc: Claudia Sofia, [name]

## 2023-01-04 NOTE — Patient Instructions (Addendum)
_______________________________________________________  If your blood pressure at your visit was 140/90 or greater, please contact your primary care physician to follow up on this.  _______________________________________________________  If you are age 87 or older, your body mass index should be between 23-30. Your Body mass index is 23.03 kg/m. If this is out of the aforementioned range listed, please consider follow up with your Primary Care Provider.  If you are age 29 or younger, your body mass index should be between 19-25. Your Body mass index is 23.03 kg/m. If this is out of the aformentioned range listed, please consider follow up with your Primary Care Provider.   ________________________________________________________  The [facility] providers would like to encourage you to use [facility] to communicate with providers for non-urgent requests or questions.  Due to long hold times on the telephone, sending your provider a message by [facility] may be a faster and more efficient way to get a response.  Please allow 48 business hours for a response.  Please remember that this is for non-urgent requests.  _______________________________________________________  Continue protonix 40mg  daily  Your provider has requested that you have an abdominal x ray before leaving today. Please go to the basement floor to our Radiology department for the test.  Please look into a pulmonologist  Thank you,  Dr. Lynann Bologna

## 2023-01-06 ENCOUNTER — Telehealth: Payer: Self-pay

## 2023-01-06 NOTE — Telephone Encounter (Signed)
Please call pt regarding this issue --- I see in GI note pt was supposed to contact our office about a referral - what symptoms is she having and who does she want to see

## 2023-01-06 NOTE — Telephone Encounter (Signed)
Copied from CRM [id number]. Topic: Referral - Question >> Jan 06, 2023 12:13 PM Herbert Seta B wrote: Reason for CRM: Patient states that PCP was supposed to refer to pulmonology per recent imaging?

## 2023-01-10 ENCOUNTER — Other Ambulatory Visit: Payer: Self-pay | Admitting: Physician Assistant

## 2023-01-10 DIAGNOSIS — R053 Chronic cough: Secondary | ICD-10-CM

## 2023-01-10 NOTE — Telephone Encounter (Signed)
Will make referral. 

## 2023-01-10 NOTE — Telephone Encounter (Signed)
Patient stated that she went to see GI for coughing that she is having after eating, he stated that he think it is something to do with her lungs. Patient would like to see someone [facility], would like to go to [place] but if there is no one in [place] she is okay with [place].

## 2023-01-17 ENCOUNTER — Ambulatory Visit (INDEPENDENT_AMBULATORY_CARE_PROVIDER_SITE_OTHER): Payer: Medicare Other

## 2023-01-17 DIAGNOSIS — Z8673 Personal history of transient ischemic attack (TIA), and cerebral infarction without residual deficits: Secondary | ICD-10-CM

## 2023-01-19 LAB — CUP PACEART REMOTE DEVICE CHECK
Date Time Interrogation Session: 20241126110138
Implantable Pulse Generator Implant Date: 20220708

## 2023-01-24 ENCOUNTER — Encounter: Payer: Self-pay | Admitting: Physician Assistant

## 2023-02-07 ENCOUNTER — Other Ambulatory Visit: Payer: Self-pay | Admitting: Physician Assistant

## 2023-02-14 NOTE — Progress Notes (Signed)
Carelink Summary Report / Loop Recorder 

## 2023-02-14 NOTE — Addendum Note (Signed)
Addended by: Geralyn Flash D on: 02/14/2023 11:33 AM   Modules accepted: Orders

## 2023-02-21 ENCOUNTER — Ambulatory Visit (INDEPENDENT_AMBULATORY_CARE_PROVIDER_SITE_OTHER): Payer: Medicare Other

## 2023-02-21 DIAGNOSIS — Z8673 Personal history of transient ischemic attack (TIA), and cerebral infarction without residual deficits: Secondary | ICD-10-CM | POA: Diagnosis not present

## 2023-02-22 LAB — CUP PACEART REMOTE DEVICE CHECK
Date Time Interrogation Session: 20241229230721
Implantable Pulse Generator Implant Date: 20220708

## 2023-02-28 ENCOUNTER — Other Ambulatory Visit: Payer: Self-pay | Admitting: Physician Assistant

## 2023-03-01 ENCOUNTER — Telehealth: Payer: Self-pay

## 2023-03-01 NOTE — Telephone Encounter (Signed)
   Patient: Claudia Rasmussen  DOB: 1931/08/29  MRN: [id number]  Prolia  benefits verified by [employer] on: 02/25/23 Coverage Available: Buy & Bill Prior authorization NOT REQUIRED Deductible: $0 of $257 met Benefit Details: : For the Primary MD purchase access option, benefits are subject to a $257 deductible ($0 met) and 20% co-insurance for the administration and cost of Prolia .   Next Prolia  injection is due 03/18/2023  Patient agreed to proceed with ordering and will be scheduled once it has arrived in the office.  Suzen CHRISTELLA Sharps, LPN

## 2023-03-10 DIAGNOSIS — N8110 Cystocele, unspecified: Secondary | ICD-10-CM | POA: Diagnosis not present

## 2023-03-10 DIAGNOSIS — N390 Urinary tract infection, site not specified: Secondary | ICD-10-CM | POA: Diagnosis not present

## 2023-03-10 DIAGNOSIS — R339 Retention of urine, unspecified: Secondary | ICD-10-CM | POA: Diagnosis not present

## 2023-03-16 ENCOUNTER — Encounter: Payer: Self-pay | Admitting: Internal Medicine

## 2023-03-16 ENCOUNTER — Ambulatory Visit: Payer: Medicare Other | Admitting: Internal Medicine

## 2023-03-16 VITALS — BP 136/89 | HR 76 | Ht 63.0 in | Wt 131.0 lb

## 2023-03-16 DIAGNOSIS — R053 Chronic cough: Secondary | ICD-10-CM | POA: Diagnosis not present

## 2023-03-16 MED ORDER — ALBUTEROL SULFATE HFA 108 (90 BASE) MCG/ACT IN AERS: INHALATION_SPRAY | RESPIRATORY_TRACT | 2 refills | Status: DC

## 2023-03-16 NOTE — Assessment & Plan Note (Addendum)
Onset ? Winter 2024 p onset of sob ? While on macrodantin chronically  during 2022  - assoc with meals but no clear aspiration events >  MBS >>>  - HRCT to r/o ILD vs bronchiectasis  03/16/2023  - max rx for gerd 03/16/2023 >>>   The most common causes of chronic cough in immunocompetent adults include the following: upper airway cough syndrome (UACS), previously referred to as postnasal drip syndrome (PNDS), which is caused by variety of rhinosinus conditions; (2) asthma; (3) GERD; (4) chronic bronchitis from cigarette smoking or other inhaled environmental irritants; (5) nonasthmatic eosinophilic bronchitis; and (6) bronchiectasis.   These conditions, singly or in combination, have accounted for up to 94% of the causes of chronic cough in prospective studies.   Other conditions have constituted no >6% of the causes in prospective studies These have included bronchogenic carcinoma, chronic interstitial pneumonia, sarcoidosis, left ventricular failure, ACEI-induced cough, and aspiration from a condition associated with pharyngeal dysfunction.    Chronic cough is often simultaneously caused by more than one condition. A single cause has been found from 38 to 82% of the time, multiple causes from 18 to 62%. Multiply caused cough has been the result of three diseases up to 42% of the time.   Most likely this is mulifactorial cough with above w/u initiated but in meantime:  Of the three most common causes of  Sub-acute / recurrent or chronic cough, only one (GERD)  can actually contribute to/ trigger  the other two (asthma and post nasal drip syndrome)  and perpetuate the cylce of cough.  While not intuitively obvious, many patients with chronic low grade reflux do not cough until there is a primary insult that disturbs the protective epithelial barrier and exposes sensitive nerve endings.   This is typically viral but can due to PNDS and  either may apply here.     >>> The point is that once this  occurs, it is difficult to eliminate the cycle  using anything but a maximally effective acid suppression regimen at least in the short run, accompanied by an appropriate diet to address non acid GERD and control /   For cough > Mucinex dm 1200 mg every 12 hours as needed for cough/ congestion ad use flutter valve as much possible  >>> leave off advair for now as can actually trigger upper airway coughing and replaced with prn saba  >>> The proper method of use, as well as anticipated side effects, of a metered-dose inhaler were discussed and demonstrated to the patient using teach back method and an empty inhaler device  F/u  6 weeks, sooner if needed.           Each maintenance medication was reviewed in detail including emphasizing most importantly the difference between maintenance and prns and under what circumstances the prns are to be triggered using an action plan format where appropriate.  Total time for H and P, chart review, counseling, reviewing hfa/flutter  device(s) and generating customized AVS unique to this office visit / same day charting   > 60 min new pt eval  for   refractory respiratory  symptoms of uncertain etiology

## 2023-03-16 NOTE — Progress Notes (Signed)
Chikamso Almasy, female    DOB: [birth year]   MRN: [id number]   Brief patient profile:  74 yowf [place]  [place] smoker in house and remembers having lots of ear infections but  quit smoking in her 38s with really no apparent sequelae and   referred to pulmonary clinic 03/16/2023 by Dr Chales Abrahams  for wheezing / sob   ?winter of 2023  > placed on advair  did not seem to help breathing then started cough daily which is really her greatest concern.   Exp to macrodantin stopped ? 2022     History of Present Illness  03/16/2023  Pulmonary/ 1st office eval/Evyn Putzier  Chief Complaint  Patient presents with   Consult    Pt states she has had a cough for over a year has coughed up a  pale yellow substance after eating or laying down.   Dyspnea:  worse since onset 2 y prior to OV , esp exertion walking back from dinner slt up hill whereas prev no trouble doing the walk Cough: esp p or while eating > gobs of mucus but no food or anything obviously purulent  Sleep: immediately on lying down > pale yellow bed is flat with two pillows/ cough resolves p 3-4 minutes  with freq urinary incont SABA use: rescue inhaler helped  02 [id number]    No obvious day to day or daytime pattern/variability or assoc excess/ purulent sputum or mucus plugs or hemoptysis or cp or chest tightness, subjective wheeze or overt sinus or hb symptoms.    Also denies any obvious fluctuation of symptoms with weather or environmental changes or other aggravating or alleviating factors except as outlined above   No unusual exposure hx or h/o childhood pna/ asthma or knowledge of premature birth.  Current Allergies, Complete Past Medical History, Past Surgical History, Family History, and Social History were reviewed in [employer] record.  ROS  The following are not active complaints unless bolded Hoarseness, sore throat, dysphagia, dental problems, itching, sneezing,  nasal congestion or discharge of excess mucus  or purulent secretions, ear ache,   fever, chills, sweats, unintended wt loss or wt gain, classically pleuritic or exertional cp,  orthopnea pnd or arm/hand swelling  or leg swelling, presyncope, palpitations, abdominal pain, anorexia, nausea, vomiting, diarrhea  or change in bowel habits or change in bladder habits, change in stools or change in urine, dysuria, hematuria,  rash, arthralgias, visual complaints, headache, numbness, weakness or ataxia or problems with walking or coordination,  change in mood or  memory.             Outpatient Medications Prior to Visit  Medication Sig Dispense Refill   atorvastatin (LIPITOR) 40 MG tablet TAKE 1 TABLET DAILY 90 tablet 1   cetirizine (ZYRTEC) 10 MG tablet TAKE 1 TABLET AT BEDTIME 90 tablet 3   Cholecalciferol (VITAMIN D3) 25 MCG (1000 UT) CAPS Take 1,000 Units by mouth in the morning.     clopidogrel (PLAVIX) 75 MG tablet TAKE 1 TABLET DAILY 90 tablet 3   D-MANNOSE PO Take by mouth in the morning and at bedtime.     denosumab (PROLIA) 60 MG/ML SOSY injection Inject 60 mg into the skin every 6 (six) months.     fluticasone-salmeterol (ADVAIR HFA) 115-21 MCG/ACT inhaler Inhale 2 puffs into the lungs 2 (two) times daily. 1 each 12   levothyroxine (SYNTHROID) 75 MCG tablet TAKE 1 TABLET DAILY 90 tablet 1   MAGNESIUM PO Take by  mouth 3 (three) times daily. Pt says she takes 1 cap in morning and 2 caps at night     Melatonin-Pyridoxine (MELATIN PO) Take by mouth.     nebivolol (BYSTOLIC) 2.5 MG tablet TAKE 1 TABLET(2.5 MG) BY MOUTH DAILY 90 tablet 3   pantoprazole (PROTONIX) 40 MG tablet TAKE 1 TABLET(40 MG) BY MOUTH DAILY 90 tablet 0   Polyvinyl Alcohol-Povidone (REFRESH OP) Place 1 drop into both eyes in the morning and at bedtime.     Probiotic Product (PROBIOTIC-10 PO) Take 1 capsule by mouth daily.     nitroGLYCERIN (NITROSTAT) 0.4 MG SL tablet Place 1 tablet (0.4 mg total) under the tongue every 5 (five) minutes as needed for chest pain. 25 tablet  3   No facility-administered medications prior to visit.    Past Medical History:  Diagnosis Date   Age-related osteoporosis without current pathological fracture    Arthritis    Atrophy of thyroid (acquired)    Benign hypertension 09/09/2020   Blepharitis 2021   Cholelithiases    Clostridium difficile infection    Diverticulitis    Diverticulitis of large intestine without perforation or abscess without bleeding    Family history of colon cancer    Migraine without aura, not intractable, without status migrainosus    Mixed hyperlipidemia    Stroke [facility])    Thyroid disease    UTI (urinary tract infection)    Vaginal prolapse    Vitamin D deficiency, unspecified       Objective:     BP 136/89 (BP Location: Left Arm, Patient Position: Sitting, Cuff Size: Normal)   Pulse 76   Ht 5\' 3"  (1.6 m)   Wt 131 lb (59.4 kg)   SpO2 95% Comment: room air  BMI 23.21 kg/m   SpO2: 95 % (room air) RA  Pleasant amb wf/ congested cough    HEENT : Oropharynx  clear      Nasal turbinates nl    NECK :  without  apparent JVD/ palpable Nodes/TM    LUNGS: no acc muscle use,  Nl contour chest which is clear to A and P bilaterally without cough on insp or exp maneuvers   CV:  RRR  no s3 or murmur or increase in P2, and no edema   ABD:  soft and nontender   MS:  Gait nl   ext warm without deformities Or obvious joint restrictions  calf tenderness, cyanosis or clubbing    SKIN: warm and dry without lesions    NEURO:  alert, approp, nl sensorium with  no motor or cerebellar deficits apparent.      I personally reviewed images and agree with radiology impression as follows:  CXR:   pa and lateral   01/04/23 COPD changes. Bibasilar interstitial prominence.  My review:  very little ILD if any    Assessment   Chronic cough Onset ? Winter 2024 p onset of sob ? While on macrodantin chronically  during 2022  - assoc with meals but no clear aspiration events >  MBS >>>  - HRCT to  r/o ILD vs bronchiectasis  03/16/2023  - max rx for gerd 03/16/2023 >>>   The most common causes of chronic cough in immunocompetent adults include the following: upper airway cough syndrome (UACS), previously referred to as postnasal drip syndrome (PNDS), which is caused by variety of rhinosinus conditions; (2) asthma; (3) GERD; (4) chronic bronchitis from cigarette smoking or other inhaled environmental irritants; (5) nonasthmatic eosinophilic bronchitis; and (6)  bronchiectasis.   These conditions, singly or in combination, have accounted for up to 94% of the causes of chronic cough in prospective studies.   Other conditions have constituted no >6% of the causes in prospective studies These have included bronchogenic carcinoma, chronic interstitial pneumonia, sarcoidosis, left ventricular failure, ACEI-induced cough, and aspiration from a condition associated with pharyngeal dysfunction.    Chronic cough is often simultaneously caused by more than one condition. A single cause has been found from 38 to 82% of the time, multiple causes from 18 to 62%. Multiply caused cough has been the result of three diseases up to 42% of the time.   Most likely this is mulifactorial cough with above w/u initiated but in meantime:  Of the three most common causes of  Sub-acute / recurrent or chronic cough, only one (GERD)  can actually contribute to/ trigger  the other two (asthma and post nasal drip syndrome)  and perpetuate the cylce of cough.  While not intuitively obvious, many patients with chronic low grade reflux do not cough until there is a primary insult that disturbs the protective epithelial barrier and exposes sensitive nerve endings.   This is typically viral but can due to PNDS and  either may apply here.     >>> The point is that once this occurs, it is difficult to eliminate the cycle  using anything but a maximally effective acid suppression regimen at least in the short run, accompanied by an  appropriate diet to address non acid GERD and control /   For cough > Mucinex dm 1200 mg every 12 hours as needed for cough/ congestion ad use flutter valve as much possible  >>> leave off advair for now as can actually trigger upper airway coughing and replaced with prn saba  >>> The proper method of use, as well as anticipated side effects, of a metered-dose inhaler were discussed and demonstrated to the patient using teach back method and an empty inhaler device  F/u  6 weeks, sooner if needed.           Each maintenance medication was reviewed in detail including emphasizing most importantly the difference between maintenance and prns and under what circumstances the prns are to be triggered using an action plan format where appropriate.  Total time for H and P, chart review, counseling, reviewing hfa/flutter  device(s) and generating customized AVS unique to this office visit / same day charting   > 60 min new pt eval  for   refractory respiratory  symptoms of uncertain etiology               Sandrea Hughs, MD 03/16/2023

## 2023-03-16 NOTE — Patient Instructions (Addendum)
Stop advair   Ok to use albuterol up to 4 hours as needed if you feel it's helpful  Pantoprazole (protonix) 40 mg   Take  30-60 min before first meal of the day and Pepcid (famotidine)  20 mg after supper until return to office - this is the best way to tell whether stomach acid is contributing to your problem.    GERD (REFLUX)  is an extremely common cause of respiratory symptoms just like yours , many times with no obvious heartburn at all.    It can be treated with medication, but also with lifestyle changes including elevation of the head of your bed (ideally with 6 -8inch risers  under the headboard of your bed),  Smoking cessation, avoidance of late meals, excessive alcohol, and avoid fatty foods, chocolate, peppermint, colas, red wine, and acidic juices such as orange juice.  NO MINT OR MENTHOL PRODUCTS SO NO COUGH DROPS (Luden's is ok)  USE SUGARLESS CANDY INSTEAD (Jolley ranchers or Stover's or Life Savers) or even ice chips will also do - the key is to swallow to prevent all throat clearing. NO OIL BASED VITAMINS - use powdered substitutes.  Avoid fish oil when coughing.   For cough > Mucinex dm 1200 mg every 12 hours as needed for cough/ congestion ad use flutter valve as much possible  My office will be contacting you by phone for referral to High resolution CT chest and Modified Barium swallow  - if you don't hear back from my office within one week please call us back or notify us thru MyChart and we'll address it right away.   Please schedule a follow up office visit in 6 weeks, call sooner if needed

## 2023-03-17 ENCOUNTER — Other Ambulatory Visit (HOSPITAL_COMMUNITY): Payer: Self-pay | Admitting: *Deleted

## 2023-03-17 DIAGNOSIS — R059 Cough, unspecified: Secondary | ICD-10-CM

## 2023-03-17 DIAGNOSIS — R1310 Dysphagia, unspecified: Secondary | ICD-10-CM

## 2023-03-18 ENCOUNTER — Other Ambulatory Visit: Payer: Self-pay | Admitting: Physician Assistant

## 2023-03-18 ENCOUNTER — Ambulatory Visit (INDEPENDENT_AMBULATORY_CARE_PROVIDER_SITE_OTHER): Payer: Medicare Other

## 2023-03-18 DIAGNOSIS — K219 Gastro-esophageal reflux disease without esophagitis: Secondary | ICD-10-CM

## 2023-03-18 DIAGNOSIS — M818 Other osteoporosis without current pathological fracture: Secondary | ICD-10-CM | POA: Diagnosis not present

## 2023-03-18 MED ORDER — DENOSUMAB 60 MG/ML ~~LOC~~ SOSY
60.0000 mg | PREFILLED_SYRINGE | Freq: Once | SUBCUTANEOUS | Status: AC
  Administered 2023-03-18: 60 mg via SUBCUTANEOUS

## 2023-03-21 ENCOUNTER — Other Ambulatory Visit: Payer: Self-pay

## 2023-03-21 DIAGNOSIS — K219 Gastro-esophageal reflux disease without esophagitis: Secondary | ICD-10-CM

## 2023-03-21 MED ORDER — PANTOPRAZOLE SODIUM 40 MG PO TBEC: 40.0000 mg | DELAYED_RELEASE_TABLET | Freq: Every day | ORAL | 0 refills | Status: DC

## 2023-03-22 ENCOUNTER — Other Ambulatory Visit: Payer: Self-pay | Admitting: Physician Assistant

## 2023-03-28 ENCOUNTER — Ambulatory Visit: Payer: Medicare Other

## 2023-03-28 DIAGNOSIS — Z8673 Personal history of transient ischemic attack (TIA), and cerebral infarction without residual deficits: Secondary | ICD-10-CM

## 2023-03-28 LAB — CUP PACEART REMOTE DEVICE CHECK
Date Time Interrogation Session: 20250202230628
Implantable Pulse Generator Implant Date: 20220708

## 2023-03-29 ENCOUNTER — Telehealth: Payer: Self-pay

## 2023-03-29 NOTE — Telephone Encounter (Signed)
 ILR ALERT: 2 pause detections, both occurring on 03/25/23, longest 9 seconds.  Patient states that these events occurred on Friday of last week when she was very sick with the norovirus. Both times she feels like she was violently vomiting at the times and recalls nearly passing out with the 1149 event.   She has recovered from the virus and is doing very well.  No further episodes.   Forwarding to Dr. Cindie for review:

## 2023-03-29 NOTE — Telephone Encounter (Signed)
 Reviewed with Dr. Waddell as Dr. Cindie is in the hospital today.   Dr. Waddell:   Event occurred during violent vomiting, patient did not pass out but recalls nearly losing consciousness. Nothing different at present, review with Dr. Cindie if anything different. Continue to monitor.

## 2023-03-30 ENCOUNTER — Ambulatory Visit (HOSPITAL_BASED_OUTPATIENT_CLINIC_OR_DEPARTMENT_OTHER)
Admission: RE | Admit: 2023-03-30 | Discharge: 2023-03-30 | Disposition: A | Discharge status: Home / Self Care | Payer: Medicare Other | Source: Ambulatory Visit | Attending: Internal Medicine | Admitting: Internal Medicine

## 2023-03-30 DIAGNOSIS — R918 Other nonspecific abnormal finding of lung field: Secondary | ICD-10-CM | POA: Diagnosis not present

## 2023-03-30 DIAGNOSIS — J479 Bronchiectasis, uncomplicated: Secondary | ICD-10-CM | POA: Diagnosis not present

## 2023-03-30 DIAGNOSIS — R053 Chronic cough: Secondary | ICD-10-CM | POA: Diagnosis not present

## 2023-04-02 ENCOUNTER — Encounter: Payer: Self-pay | Admitting: Cardiology

## 2023-04-08 ENCOUNTER — Ambulatory Visit (HOSPITAL_COMMUNITY)
Admission: RE | Admit: 2023-04-08 | Discharge: 2023-04-08 | Disposition: A | Discharge status: Home / Self Care | Payer: Medicare Other | Source: Ambulatory Visit | Attending: Physician Assistant | Admitting: Physician Assistant

## 2023-04-08 DIAGNOSIS — R1310 Dysphagia, unspecified: Secondary | ICD-10-CM | POA: Diagnosis not present

## 2023-04-08 DIAGNOSIS — R638 Other symptoms and signs concerning food and fluid intake: Secondary | ICD-10-CM | POA: Diagnosis not present

## 2023-04-08 DIAGNOSIS — R053 Chronic cough: Secondary | ICD-10-CM | POA: Diagnosis present

## 2023-04-08 DIAGNOSIS — R059 Cough, unspecified: Secondary | ICD-10-CM

## 2023-05-02 ENCOUNTER — Ambulatory Visit (INDEPENDENT_AMBULATORY_CARE_PROVIDER_SITE_OTHER): Payer: Medicare Other

## 2023-05-02 DIAGNOSIS — Z8673 Personal history of transient ischemic attack (TIA), and cerebral infarction without residual deficits: Secondary | ICD-10-CM | POA: Diagnosis not present

## 2023-05-02 LAB — CUP PACEART REMOTE DEVICE CHECK
Date Time Interrogation Session: 20250309230637
Implantable Pulse Generator Implant Date: 20220708

## 2023-05-04 NOTE — Progress Notes (Signed)
 [facility] Summary Report / Loop Recorder

## 2023-05-05 ENCOUNTER — Encounter: Payer: Self-pay | Admitting: Cardiology

## 2023-05-12 NOTE — Progress Notes (Unsigned)
 Claudia Rasmussen, female    DOB: 02-16-1932   MRN: [id number]   Brief patient profile:  16 yowf [place]  [place] smoker in house and remembers having lots of ear infections but  quit smoking in her 87s with really no apparent sequelae and   referred to pulmonary clinic 03/16/2023 by Dr Chales Abrahams  for wheezing / sob   ?winter of 2023  > placed on advair  did not seem to help breathing then started cough daily which is really her greatest concern.   Exp to macrodantin stopped ? 2022     History of Present Illness  03/16/2023  Pulmonary/ 1st office eval/Claudia Rasmussen  Chief Complaint  Patient presents with   Consult    Pt states she has had a cough for over a year has coughed up a  pale yellow substance after eating or laying down.   Dyspnea:  worse since onset 2 y prior to OV , esp exertion walking back from dinner slt up hill whereas prev no trouble doing the walk Cough: esp p or while eating > gobs of mucus but no food or anything obviously purulent  Sleep: immediately on lying down > pale yellow bed is flat with two pillows/ cough resolves p 3-4 minutes  with freq urinary incont SABA use: rescue inhaler helped  02 [id number]  Rec Stop advair  Ok to use albuterol up to 4 hours as needed if you feel it's helpful Pantoprazole (protonix) 40 mg   Take  30-60 min before first meal of the day and Pepcid (famotidine)  20 mg after supper until return to office  GERD diet reviewed, bed blocks rec  For cough > Mucinex dm 1200 mg every 12 hours as needed for cough/ congestion ad use flutter valve as much possible   MBS  ok  04/08/23  HRCT with muliplte findings but no ILD 03/30/23      Please schedule a follow up office visit in 6 weeks, call sooner if needed   05/13/2023  f/u ov/Claudia Rasmussen re: doe/cough  maint on ppi qam  and proair bid (not prn) only uses p ex/ never prior   Chief Complaint  Patient presents with   Follow-up    Cough is much improved.   Dyspnea:  no change doe up hills with assoc  wheeze Cough: none  Sleeping: ok flat  resp cc  SABA use: bid saba/ not prn  02: none       No obvious day to day or daytime variability or assoc excess/ purulent sputum or mucus plugs or hemoptysis or cp or chest tightness,   or overt sinus or hb symptoms.    Also denies any obvious fluctuation of symptoms with weather or environmental changes or other aggravating or alleviating factors except as outlined above   No unusual exposure hx or h/o childhood pna/ asthma or knowledge of premature birth.  Current Allergies, Complete Past Medical History, Past Surgical History, Family History, and Social History were reviewed in [employer] record.  ROS  The following are not active complaints unless bolded Hoarseness, sore throat, dysphagia, dental problems, itching, sneezing,  nasal congestion or discharge of excess mucus or purulent secretions, ear ache,   fever, chills, sweats, unintended wt loss or wt gain, classically pleuritic or exertional cp,  orthopnea pnd or arm/hand swelling  or leg swelling, presyncope, palpitations, abdominal pain, anorexia, nausea, vomiting, diarrhea  or change in bowel habits or change in bladder habits, change in stools  or change in urine, dysuria, hematuria,  rash, arthralgias, visual complaints, headache, numbness, weakness or ataxia or problems with walking or coordination,  change in mood or  memory.        Current Meds  Medication Sig   atorvastatin (LIPITOR) 40 MG tablet TAKE 1 TABLET DAILY   cetirizine (ZYRTEC) 10 MG tablet TAKE 1 TABLET AT BEDTIME   Cholecalciferol (VITAMIN D3) 25 MCG (1000 UT) CAPS Take 1,000 Units by mouth in the morning.   clopidogrel (PLAVIX) 75 MG tablet TAKE 1 TABLET DAILY   D-MANNOSE PO Take by mouth in the morning and at bedtime.   denosumab (PROLIA) 60 MG/ML SOSY injection Inject 60 mg into the skin every 6 (six) months.   famotidine (PEPCID) 20 MG tablet One after supper   levothyroxine (SYNTHROID) 75  MCG tablet TAKE 1 TABLET DAILY   MAGNESIUM PO Take by mouth 3 (three) times daily. Pt says she takes 1 cap in morning and 2 caps at night   Melatonin-Pyridoxine (MELATIN PO) Take by mouth.   nebivolol (BYSTOLIC) 2.5 MG tablet TAKE 1 TABLET(2.5 MG) BY MOUTH DAILY   pantoprazole (PROTONIX) 40 MG tablet Take 30-60 min before first meal of the day   Polyvinyl Alcohol-Povidone (REFRESH OP) Place 1 drop into both eyes in the morning and at bedtime.   Probiotic Product (PROBIOTIC-10 PO) Take 1 capsule by mouth daily.   [DISCONTINUED] albuterol (PROAIR HFA) 108 (90 Base) MCG/ACT inhaler 2 puffs every 4 hours as needed only  if your can't catch your breath          Past Medical History:  Diagnosis Date   Age-related osteoporosis without current pathological fracture    Arthritis    Atrophy of thyroid (acquired)    Benign hypertension 09/09/2020   Blepharitis 2021   Cholelithiases    Clostridium difficile infection    Diverticulitis    Diverticulitis of large intestine without perforation or abscess without bleeding    Family history of colon cancer    Migraine without aura, not intractable, without status migrainosus    Mixed hyperlipidemia    Stroke [facility])    Thyroid disease    UTI (urinary tract infection)    Vaginal prolapse    Vitamin D deficiency, unspecified       Objective:    Wts   05/13/2023       127   03/16/23 131 lb (59.4 kg)  01/04/23 130 lb (59 kg)  11/29/22 127 lb 9.6 oz (57.9 kg)     Vital signs reviewed  05/13/2023  - Note at rest 02 sats  95% on RA    General appearance:    amb eldery wf nad   hfa   75%   HEENT : Oropharynx  clear   Nasal turbinates nl    NECK :  without  apparent JVD/ palpable Nodes/TM    LUNGS: no acc muscle use,  Min barrel  contour chest wall with bilateral  slightly decreased bs s audible wheeze and  without cough on insp or exp maneuvers and min  Hyperresonant  to  percussion bilaterally    CV:  RRR  no s3 or murmur or increase in  P2, and no edema   ABD:  soft and nontender with pos end  insp Hoover's  in the supine position.  No bruits or organomegaly appreciated   MS:  Nl gait/ ext warm without deformities Or obvious joint restrictions  calf tenderness, cyanosis or clubbing  SKIN: warm and dry without lesions    NEURO:  alert, approp, nl sensorium with  no motor or cerebellar deficits apparent.       Assessment

## 2023-05-13 ENCOUNTER — Encounter: Payer: Self-pay | Admitting: Internal Medicine

## 2023-05-13 ENCOUNTER — Ambulatory Visit: Payer: Medicare Other | Admitting: Internal Medicine

## 2023-05-13 VITALS — BP 128/78 | HR 67 | Ht 63.0 in | Wt 127.8 lb

## 2023-05-13 DIAGNOSIS — R053 Chronic cough: Secondary | ICD-10-CM

## 2023-05-13 DIAGNOSIS — Z87891 Personal history of nicotine dependence: Secondary | ICD-10-CM | POA: Diagnosis not present

## 2023-05-13 DIAGNOSIS — R0609 Other forms of dyspnea: Secondary | ICD-10-CM | POA: Diagnosis not present

## 2023-05-13 MED ORDER — ALBUTEROL SULFATE HFA 108 (90 BASE) MCG/ACT IN AERS: INHALATION_SPRAY | RESPIRATORY_TRACT | 2 refills | Status: AC

## 2023-05-13 MED ORDER — PANTOPRAZOLE SODIUM 40 MG PO TBEC
DELAYED_RELEASE_TABLET | ORAL | 2 refills | Status: AC
Start: 2023-05-13 — End: ?

## 2023-05-13 MED ORDER — FAMOTIDINE 20 MG PO TABS
ORAL_TABLET | ORAL | 2 refills | Status: AC
Start: 2023-05-13 — End: ?

## 2023-05-13 NOTE — Assessment & Plan Note (Addendum)
 Onset ? Winter 2024 p onset of sob ? While on macrodantin chronically  during 2022  - assoc with meals but no clear aspiration events >  MBS >>>  - HRCT to r/o ILD vs bronchiectasis  03/16/2023  - max rx for gerd 03/16/2023 >>>  - [facility]  04/08/23 recs Swallowing Evaluation Recommendations  Regular; Thin liquids (Level 0) Liquid Administration via: Cup; Straw Medication Administration: Whole meds with liquid Supervision: Patient able to self-feed - HRCT   03/30/23  1. Severe emphysema. 2. Minimal, bland appearing scarring in the bilateral lung bases. Minimal tubular bronchiectasis in the lung bases. No evidence of fibrotic interstitial lung disease. 3. Soft tissue attenuation lesion of the anterior midportion of the left kidney measuring 2.8 x 2.8 cm, concerning for mass. Recommend dedicated renal protocol CT or MRI to further evaluate if clinically appropriate given patient > directed to her kidney doctor 05/13/2023 >>>  4. Enlargement of the tubular ascending thoracic aorta, measuring up to 4.4 x 4.3 cm.  > directed to her cardiologist  05/13/2023 >>>  5.Coronary artery disease. 6. Cholelithiasis.   Aortic Atherosclerosis (ICD10-I70.0) and Emphysema (ICD10-J43.9)  Cough has resolved to her satisfaction but she does have mild bronchiectasis so rec:  For cough/ congestion > mucinex or mucinex dm  up to maximum of  1200 mg every 12 hours and use the flutter valve as much as you can    Resume ppi q am and h2 q pm x next 3 month gauge response or not.

## 2023-05-13 NOTE — Assessment & Plan Note (Signed)
 Onset 2003 doe up hill [facility] - [facility]   Walked on RA  x  3  lap(s) =  approx 750  ft  @  mod  pace, stopped due to end of study  with lowest 02 sats 95% s sob - 05/13/2023  After extensive coaching inhaler device,  effectiveness =    60% from a baseline of 40% > continue proair bu use it up to q 4 h prn    Re SABA :  I spent extra time with pt today reviewing appropriate use of albuterol for prn use on exertion with the following points: 1) saba is for relief of sob that does not improve by walking a slower pace or resting but rather if the pt does not improve after trying this first. 2) If the pt is convinced, as many are, that saba helps recover from activity faster then it's easy to tell if this is the case by re-challenging : ie stop, take the inhaler, then p 5 minutes try the exact same activity (intensity of workload) that just caused the symptoms and see if they are substantially diminished or not after saba 3) if there is an activity that reproducibly causes the symptoms, try the saba 15 min before the activity on alternate days   If in fact the saba really does help, then fine to continue to use it prn but advised may need to look closer at the maintenance regimen being used to achieve better control of airways disease with exertion.   F/u  35m sooner prn   Each maintenance medication was reviewed in detail including emphasizing most importantly the difference between maintenance and prns and under what circumstances the prns are to be triggered using an action plan format where appropriate.  Total time for H and P, chart review, counseling, reviewing hfa device(s) , directly observing portions of ambulatory 02 saturation study/ and generating customized AVS unique to this office visit / same day charting = 33 min

## 2023-05-13 NOTE — Patient Instructions (Addendum)
 Resume  Pantoprazole (protonix) 40 mg   Take  30-60 min before first meal of the day and Pepcid (famotidine)  20 mg after supper until return to office - this is the best way to tell whether stomach acid is contributing to your problem.    For cough > Mucinex dm 1200 mg every 12 hours as needed for cough/ congestion ad use flutter valve as much possible  For shortness of breath  / wheezing > Proair (red) up to 2 puffs every hours as needed  Also  Ok to try albuterol 15 min before an activity (on alternating days)  that you know would usually make you short of breath and see if it makes any difference and if makes none then don't take albuterol after activity unless you can't catch your breath as this means it's the resting that helps, not the albuterol.        Discuss your kidney scan with your kidney doctor and your aorta with your heart doctor   Please schedule a follow up visit in 3 months but call sooner if needed

## 2023-05-19 DIAGNOSIS — N1832 Chronic kidney disease, stage 3b: Secondary | ICD-10-CM | POA: Diagnosis not present

## 2023-05-23 DIAGNOSIS — N289 Disorder of kidney and ureter, unspecified: Secondary | ICD-10-CM | POA: Diagnosis not present

## 2023-05-23 DIAGNOSIS — N1832 Chronic kidney disease, stage 3b: Secondary | ICD-10-CM | POA: Diagnosis not present

## 2023-05-23 DIAGNOSIS — I129 Hypertensive chronic kidney disease with stage 1 through stage 4 chronic kidney disease, or unspecified chronic kidney disease: Secondary | ICD-10-CM | POA: Diagnosis not present

## 2023-05-23 DIAGNOSIS — R600 Localized edema: Secondary | ICD-10-CM | POA: Diagnosis not present

## 2023-05-25 DIAGNOSIS — Z23 Encounter for immunization: Secondary | ICD-10-CM | POA: Diagnosis not present

## 2023-05-30 DIAGNOSIS — N281 Cyst of kidney, acquired: Secondary | ICD-10-CM | POA: Diagnosis not present

## 2023-05-30 DIAGNOSIS — K8020 Calculus of gallbladder without cholecystitis without obstruction: Secondary | ICD-10-CM | POA: Diagnosis not present

## 2023-05-30 DIAGNOSIS — I700 Atherosclerosis of aorta: Secondary | ICD-10-CM | POA: Diagnosis not present

## 2023-05-30 DIAGNOSIS — N289 Disorder of kidney and ureter, unspecified: Secondary | ICD-10-CM | POA: Diagnosis not present

## 2023-05-31 ENCOUNTER — Encounter: Payer: Self-pay | Admitting: Physician Assistant

## 2023-05-31 ENCOUNTER — Ambulatory Visit (INDEPENDENT_AMBULATORY_CARE_PROVIDER_SITE_OTHER): Payer: Medicare Other | Admitting: Physician Assistant

## 2023-05-31 VITALS — BP 110/70 | HR 66 | Temp 97.8°F | Resp 18 | Ht 63.0 in | Wt 128.4 lb

## 2023-05-31 DIAGNOSIS — I2510 Atherosclerotic heart disease of native coronary artery without angina pectoris: Secondary | ICD-10-CM

## 2023-05-31 DIAGNOSIS — M818 Other osteoporosis without current pathological fracture: Secondary | ICD-10-CM

## 2023-05-31 DIAGNOSIS — E782 Mixed hyperlipidemia: Secondary | ICD-10-CM | POA: Diagnosis not present

## 2023-05-31 DIAGNOSIS — I10 Essential (primary) hypertension: Secondary | ICD-10-CM

## 2023-05-31 DIAGNOSIS — N1832 Chronic kidney disease, stage 3b: Secondary | ICD-10-CM

## 2023-05-31 DIAGNOSIS — E038 Other specified hypothyroidism: Secondary | ICD-10-CM

## 2023-05-31 DIAGNOSIS — N8110 Cystocele, unspecified: Secondary | ICD-10-CM | POA: Diagnosis not present

## 2023-05-31 DIAGNOSIS — E559 Vitamin D deficiency, unspecified: Secondary | ICD-10-CM | POA: Diagnosis not present

## 2023-05-31 DIAGNOSIS — J438 Other emphysema: Secondary | ICD-10-CM

## 2023-05-31 DIAGNOSIS — I129 Hypertensive chronic kidney disease with stage 1 through stage 4 chronic kidney disease, or unspecified chronic kidney disease: Secondary | ICD-10-CM | POA: Diagnosis not present

## 2023-05-31 NOTE — Progress Notes (Signed)
 Established Patient Office Visit  Subjective:  Patient ID: Claudia Rasmussen, female    DOB: 16-Nov-1931  Age: 89 y.o. MRN: [id number]  CC:  Chief Complaint  Patient presents with   Medical Management of Chronic Issues    HPI Claudia Rasmussen presents for follow up hypothyroidism and chronic medical issues  Claudia Rasmussen presents with atrophy of thyroid (acquired).  Date of diagnosis 2002.  She is currently taking Synthroid, 75 mcg daily. Is due for labwork today    Pt presents with hyperlipidemia.  Date of diagnosis 09/2009.  Current treatment includes lipitor 40mg  qd Compliance with treatment has been good; she takes her medication as directed, maintains her low cholesterol diet, follows up as directed, and maintains her exercise regimen.  She denies experiencing any hypercholesterolemia related symptoms  Dx with age-related osteoporosis without current pathological fracture; this has been a problem for the past several years.  She describes the intensity of arthralgias as moderate.She is currently on Prolia injections   Pt with history of Chronic Kidney disease - is following with nephrology now on a every 6 month basis- states everything has been stable recently with most recent visit yesterday having MRI for a renal lesion- had recent cbc, renal panel  and vit D levels done which were stable  Pt with history of emphysema - currently on proair as needed and symptoms stable- denies chest pain or shortness of breath Pt follows regularly with Dr Sherene Sires every 6 months  Pt with history of CAD and having a loop recorder - she follows with Dr Servando Salina regularly and next appt in August Denies chest pain/sob  Pt follows with Dr Saddie Benders quarterly for recurrent UTIs - no symptoms today She also has vaginal prolapse and has a pessary he manages   .  Past Medical History:  Diagnosis Date   Age-related osteoporosis without current pathological fracture    Arthritis    Atrophy of thyroid (acquired)     Benign hypertension 09/09/2020   Blepharitis 2021   Cholelithiases    Clostridium difficile infection    Diverticulitis    Diverticulitis of large intestine without perforation or abscess without bleeding    Family history of colon cancer    Migraine without aura, not intractable, without status migrainosus    Mixed hyperlipidemia    Stroke [facility])    Thyroid disease    UTI (urinary tract infection)    Vaginal prolapse    Vitamin D deficiency, unspecified     Past Surgical History:  Procedure Laterality Date   ABDOMINAL HYSTERECTOMY     APPENDECTOMY  1969   CATARACT EXTRACTION, BILATERAL Bilateral    right- 03/22/2018 and left 05/10/2018   COLONOSCOPY  05/12/2009   Colonic polyp status post polypectomy. Moderate predominanltly left colonic diverticulosis. Internal hemorrhoids   LEFT HEART CATH AND CORONARY ANGIOGRAPHY N/A 10/14/2020   Procedure: LEFT HEART CATH AND CORONARY ANGIOGRAPHY;  Surgeon: Corky Crafts, MD;  [facility]VAS[facility] CV LAB;  Service: Cardiovascular;  Laterality: N/A;   LOOP RECORDER INSERTION N/A 08/29/2020   Procedure: LOOP RECORDER INSERTION;  Surgeon: Lanier Prude, MD;  Location: [facility] INVASIVE CV LAB;  Service: Cardiovascular;  Laterality: N/A;   MOHS SURGERY  2023   Forehead   OOPHORECTOMY Left     Family History  Problem Relation Age of Onset   Breast cancer Mother    Colon cancer Father    Prostate cancer Brother    Coronary artery disease Other    Hypertension Other  Esophageal cancer Neg Hx    Rectal cancer Neg Hx    Stomach cancer Neg Hx     Social History   Socioeconomic History   Marital status: Widowed    Spouse name: Not on file   Number of children: 3   Years of education: Not on file   Highest education level: Bachelor's degree (e.g., BA, AB, BS)  Occupational History   Not on file  Tobacco Use   Smoking status: Former   Smokeless tobacco: Never  Vaping Use   Vaping status: Never Used  Substance and Sexual  Activity   Alcohol use: Yes    Comment: Drinks alcohol very infrequently. She typically consumes white wine.   Drug use: Never   Sexual activity: Not on file  Other Topics Concern   Not on file  Social History Narrative   [facility] at [facility]   Social Drivers of Health   Financial Resource Strain: Low Risk  (05/27/2023)   Overall Financial Resource Strain (CARDIA)    Difficulty of Paying Living Expenses: Not hard at all  Food Insecurity: No Food Insecurity (05/27/2023)   Hunger Vital Sign    Worried About Running Out of Food in the Last Year: Never true    Ran Out of Food in the Last Year: Never true  Transportation Needs: No Transportation Needs (05/27/2023)   PRAPARE - Administrator, Civil Service (Medical): No    Lack of Transportation (Non-Medical): No  Physical Activity: Inactive (05/27/2023)   Exercise Vital Sign    Days of Exercise per Week: 0 days    Minutes of Exercise per Session: 10 min  Stress: No Stress Concern Present (05/27/2023)   [employer] of Occupational Health - Occupational Stress Questionnaire    Feeling of Stress : Not at all  Social Connections: Moderately Integrated (05/27/2023)   Social Connection and Isolation Panel [NHANES]    Frequency of Communication with Friends and Family: More than three times a week    Frequency of Social Gatherings with Friends and Family: More than three times a week    Attends Religious Services: More than 4 times per year    Active Member of [employer] or Organizations: Yes    Attends Banker Meetings: More than 4 times per year    Marital Status: Widowed  Catering manager Violence: Not on file     Current Outpatient Medications:    albuterol (PROAIR HFA) 108 (90 Base) MCG/ACT inhaler, 2 puffs every 4 hours as needed only  if your can't catch your breath, Disp: 59.5 g, Rfl: 2   atorvastatin (LIPITOR) 40 MG tablet, TAKE 1 TABLET DAILY, Disp: 90 tablet, Rfl: 1   cetirizine (ZYRTEC) 10 MG  tablet, TAKE 1 TABLET AT BEDTIME, Disp: 90 tablet, Rfl: 3   Cholecalciferol (VITAMIN D3) 25 MCG (1000 UT) CAPS, Take 1,000 Units by mouth in the morning., Disp: , Rfl:    clopidogrel (PLAVIX) 75 MG tablet, TAKE 1 TABLET DAILY, Disp: 90 tablet, Rfl: 1   D-MANNOSE PO, Take by mouth in the morning and at bedtime., Disp: , Rfl:    denosumab (PROLIA) 60 MG/ML SOSY injection, Inject 60 mg into the skin every 6 (six) months., Disp: , Rfl:    famotidine (PEPCID) 20 MG tablet, One after supper, Disp: 90 tablet, Rfl: 2   fluticasone (FLONASE) 50 MCG/ACT nasal spray, Place into the nose., Disp: , Rfl:    furosemide (LASIX) 20 MG tablet, Take by mouth., Disp: ,  Rfl:    levothyroxine (SYNTHROID) 75 MCG tablet, TAKE 1 TABLET DAILY, Disp: 90 tablet, Rfl: 1   MAGNESIUM PO, Take by mouth 3 (three) times daily. Pt says she takes 1 cap in morning and 2 caps at night, Disp: , Rfl:    Melatonin-Pyridoxine (MELATIN PO), Take by mouth., Disp: , Rfl:    nebivolol (BYSTOLIC) 2.5 MG tablet, TAKE 1 TABLET(2.5 MG) BY MOUTH DAILY, Disp: 90 tablet, Rfl: 3   pantoprazole (PROTONIX) 40 MG tablet, Take 30-60 min before first meal of the day, Disp: 90 tablet, Rfl: 2   Polyvinyl Alcohol-Povidone (REFRESH OP), Place 1 drop into both eyes in the morning and at bedtime., Disp: , Rfl:    Probiotic Product (PROBIOTIC-10 PO), Take 1 capsule by mouth daily., Disp: , Rfl:    nitroGLYCERIN (NITROSTAT) 0.4 MG SL tablet, Place 1 tablet (0.4 mg total) under the tongue every 5 (five) minutes as needed for chest pain., Disp: 25 tablet, Rfl: 3   Allergies  Allergen Reactions   Shellfish Allergy Other (See Comments)    Itching, Hives Itching, Hives   Cephalosporins Other (See Comments)    Itching    Levaquin [Levofloxacin]    Nitrofuran Derivatives Other (See Comments)    Made her not feel good, caused tremors, increased creatnine   Azithromycin Rash   Moxifloxacin Hcl Rash   Phenobarbital Rash   CONSTITUTIONAL: Negative for chills,  fatigue, fever, unintentional weight gain and unintentional weight loss.  E/N/T: Negative for ear pain, nasal congestion and sore throat.  CARDIOVASCULAR: Negative for chest pain, dizziness, palpitations and pedal edema.  RESPIRATORY: Negative for recent cough and dyspnea.  GASTROINTESTINAL: Negative for abdominal pain, acid reflux symptoms, constipation, diarrhea, nausea and vomiting.  MSK: Negative for arthralgias and myalgias.  INTEGUMENTARY: Negative for rash.  NEUROLOGICAL: Negative for dizziness and headaches.  PSYCHIATRIC: Negative for sleep disturbance and to question depression screen.  Negative for depression, negative for anhedonia.        Objective:  PHYSICAL EXAM:   VS: BP 110/70   Pulse 66   Temp 97.8 F (36.6 C) (Temporal)   Resp 18   Ht 5\' 3"  (1.6 m)   Wt 128 lb 6.4 oz (58.2 kg)   SpO2 96%   BMI 22.75 kg/m   GEN: Well nourished, well developed, in no acute distress  HEENT: normal external ears and nose - normal external auditory canals and TMS -  - Lips, Teeth and Gums - normal  Oropharynx - normal mucosa, palate, and posterior pharynx  Cardiac: RRR; no murmurs, rubs, or gallops,no edema -  Respiratory:  normal respiratory rate and pattern with no distress - normal breath sounds with no rales, rhonchi, wheezes or rubs  Skin: warm and dry, no rash  Neuro:  Alert and Oriented x 3,- CN II-Xii grossly intact Psych: euthymic mood, appropriate affect and demeanor   No visits with results within 1 Day(s) from this visit.  Latest known visit with results is:  Appointment on 05/02/2023  Component Date Value Ref Range Status   Date Time Interrogation Session 05/01/2023 [id number]   Final   Pulse Generator Manufacturer 05/01/2023 MERM   Final   Pulse Gen Model 05/01/2023 [id number]   Final   Pulse Gen Serial Number 05/01/2023 [id number]   Final   Clinic Name 05/01/2023 [facility]   Final   Implantable Pulse Generator Type 05/01/2023 ICM/ILR   Final    Implantable Pulse Generator Implan* 05/01/2023 [id number]   Final  Health Maintenance Due  Topic Date Due   DTaP/Tdap/Td (1 - Tdap) Never done     There are no preventive care reminders to display for this patient.  Lab Results  Component Value Date   TSH 2.470 11/29/2022   Lab Results  Component Value Date   WBC 6.2 05/20/2022   HGB 13.6 05/20/2022   HCT 41.8 05/20/2022   MCV 94 05/20/2022   PLT 174 05/20/2022   Lab Results  Component Value Date   NA 142 11/29/2022   K 5.5 (H) 11/29/2022   CO2 20 11/29/2022   GLUCOSE 97 11/29/2022   BUN 33 (H) 11/29/2022   CREATININE 1.26 (H) 11/29/2022   BILITOT 0.7 11/29/2022   ALKPHOS 116 11/29/2022   AST 42 (H) 11/29/2022   ALT 37 (H) 11/29/2022   PROT 6.9 11/29/2022   ALBUMIN 3.9 11/29/2022   CALCIUM 9.5 11/29/2022   ANIONGAP 9 10/06/2021   EGFR 41 (L) 11/29/2022   Lab Results  Component Value Date   CHOL 166 11/29/2022   Lab Results  Component Value Date   HDL 80 11/29/2022   Lab Results  Component Value Date   LDLCALC 75 11/29/2022   Lab Results  Component Value Date   TRIG 57 11/29/2022   Lab Results  Component Value Date   CHOLHDL 2.1 11/29/2022   Lab Results  Component Value Date   HGBA1C 5.4 08/28/2020      Assessment & Plan:   Problem List Items Addressed This Visit       Endocrine   Other specified hypothyroidism - Primary   Relevant Orders   TSH Continue current meds     Genitourinary   Stage 3b chronic kidney disease (HCC) Continue follow up with nephrology     Other   Mixed hyperlipidemia   Relevant Orders   Lipid panel Continue meds   Vit D def Recent lab normal Continue supplement  Emphysema Continue proventil prn  Hx CAD and loop recorder Follow up with cardiology as directed      Osteoporosis Continue prolia  GERD Continue protonix                   No orders of the defined types were placed in this encounter.    Follow-up: Return in about 6 months  (around 11/30/2023) for chronic fasting follow-up.    SARA R Letanya Froh, [name]

## 2023-06-01 ENCOUNTER — Encounter: Payer: Self-pay | Admitting: Family Medicine

## 2023-06-01 LAB — COMPREHENSIVE METABOLIC PANEL WITH GFR
ALT: 26 IU/L (ref 0–32)
AST: 29 IU/L (ref 0–40)
Albumin: 4 g/dL (ref 3.7–4.7)
Alkaline Phosphatase: 93 IU/L (ref 44–121)
BUN/Creatinine Ratio: 23 (ref 12–28)
BUN: 31 mg/dL — ABNORMAL HIGH (ref 8–27)
Bilirubin Total: 0.7 mg/dL (ref 0.0–1.2)
CO2: 20 mmol/L (ref 20–29)
Calcium: 10 mg/dL (ref 8.7–10.3)
Chloride: 107 mmol/L — ABNORMAL HIGH (ref 96–106)
Creatinine, Ser: 1.36 mg/dL — ABNORMAL HIGH (ref 0.57–1.00)
Globulin, Total: 2.7 g/dL (ref 1.5–4.5)
Glucose: 95 mg/dL (ref 70–99)
Potassium: 5 mmol/L (ref 3.5–5.2)
Sodium: 142 mmol/L (ref 134–144)
Total Protein: 6.7 g/dL (ref 6.0–8.5)
eGFR: 37 mL/min/{1.73_m2} — ABNORMAL LOW (ref >59)

## 2023-06-01 LAB — LIPID PANEL
Chol/HDL Ratio: 2.3 ratio (ref 0.0–4.4)
Cholesterol, Total: 160 mg/dL (ref 100–199)
HDL: 70 mg/dL (ref >39)
LDL Chol Calc (NIH): 78 mg/dL (ref 0–99)
Triglycerides: 62 mg/dL (ref 0–149)
VLDL Cholesterol Cal: 12 mg/dL (ref 5–40)

## 2023-06-01 LAB — TSH: TSH: 5.8 u[IU]/mL — ABNORMAL HIGH (ref 0.450–4.500)

## 2023-06-01 LAB — MAGNESIUM: Magnesium: 2.1 mg/dL (ref 1.6–2.3)

## 2023-06-02 ENCOUNTER — Encounter: Payer: Self-pay | Admitting: Physician Assistant

## 2023-06-02 ENCOUNTER — Other Ambulatory Visit: Payer: Self-pay | Admitting: Family Medicine

## 2023-06-02 MED ORDER — LEVOTHYROXINE SODIUM 50 MCG PO TABS: 50.0000 ug | ORAL_TABLET | Freq: Every day | ORAL | 0 refills | Status: DC

## 2023-06-06 ENCOUNTER — Ambulatory Visit (INDEPENDENT_AMBULATORY_CARE_PROVIDER_SITE_OTHER): Payer: Medicare Other

## 2023-06-06 DIAGNOSIS — Z8673 Personal history of transient ischemic attack (TIA), and cerebral infarction without residual deficits: Secondary | ICD-10-CM

## 2023-06-06 LAB — CUP PACEART REMOTE DEVICE CHECK
Date Time Interrogation Session: 20250413230653
Implantable Pulse Generator Implant Date: 20220708

## 2023-06-06 NOTE — Progress Notes (Signed)
 [facility] Summary Report / Loop Recorder

## 2023-06-07 ENCOUNTER — Encounter: Payer: Self-pay | Admitting: Cardiology

## 2023-06-07 ENCOUNTER — Other Ambulatory Visit (HOSPITAL_BASED_OUTPATIENT_CLINIC_OR_DEPARTMENT_OTHER): Payer: Self-pay | Admitting: Family

## 2023-06-07 DIAGNOSIS — I719 Aortic aneurysm of unspecified site, without rupture: Secondary | ICD-10-CM

## 2023-06-07 DIAGNOSIS — Q2543 Congenital aneurysm of aorta: Secondary | ICD-10-CM

## 2023-06-21 ENCOUNTER — Ambulatory Visit (HOSPITAL_BASED_OUTPATIENT_CLINIC_OR_DEPARTMENT_OTHER)
Admission: RE | Admit: 2023-06-21 | Discharge: 2023-06-21 | Disposition: A | Discharge status: Home / Self Care | Source: Ambulatory Visit | Attending: Family | Admitting: Family

## 2023-06-21 DIAGNOSIS — Q2543 Congenital aneurysm of aorta: Secondary | ICD-10-CM | POA: Diagnosis not present

## 2023-06-21 LAB — ECHOCARDIOGRAM COMPLETE
AR max vel: 1.54 cm2
AV Area VTI: 1.52 cm2
AV Area mean vel: 1.41 cm2
AV Mean grad: 4 mmHg
AV Peak grad: 6.9 mmHg
AV Vena cont: 0.3 cm
Ao pk vel: 1.31 m/s
Area-P 1/2: 2.13 cm2
Calc EF: 61.4 %
MV M vel: 4.59 m/s
MV Peak grad: 84.3 mmHg
MV Vena cont: 0.1 cm
P 1/2 time: 584 ms
Radius: 0.25 cm
S' Lateral: 3.1 cm
Single Plane A2C EF: 60.4 %
Single Plane A4C EF: 62.9 %

## 2023-06-22 ENCOUNTER — Encounter (HOSPITAL_BASED_OUTPATIENT_CLINIC_OR_DEPARTMENT_OTHER): Payer: Self-pay

## 2023-06-28 ENCOUNTER — Telehealth: Payer: Self-pay

## 2023-06-28 NOTE — Telephone Encounter (Signed)
 Called patient to schedule Medicare Annual Wellness Visit (AWV). Left message for patient to call back and schedule Medicare Annual Wellness Visit (AWV).   Last date of AWV: 12/27/2022   Please schedule this appointment with Delford Felling, FNP

## 2023-07-08 LAB — CUP PACEART REMOTE DEVICE CHECK
Date Time Interrogation Session: 20250515005323
Implantable Pulse Generator Implant Date: 20220708

## 2023-07-10 ENCOUNTER — Ambulatory Visit: Payer: Self-pay | Admitting: Cardiology

## 2023-07-11 ENCOUNTER — Ambulatory Visit (INDEPENDENT_AMBULATORY_CARE_PROVIDER_SITE_OTHER): Payer: Medicare Other

## 2023-07-11 DIAGNOSIS — N8110 Cystocele, unspecified: Secondary | ICD-10-CM | POA: Diagnosis not present

## 2023-07-11 DIAGNOSIS — Z8673 Personal history of transient ischemic attack (TIA), and cerebral infarction without residual deficits: Secondary | ICD-10-CM

## 2023-07-11 DIAGNOSIS — N390 Urinary tract infection, site not specified: Secondary | ICD-10-CM | POA: Diagnosis not present

## 2023-07-11 DIAGNOSIS — R339 Retention of urine, unspecified: Secondary | ICD-10-CM | POA: Diagnosis not present

## 2023-07-26 NOTE — Progress Notes (Signed)
 [facility] Summary Report / Loop Recorder

## 2023-08-04 ENCOUNTER — Other Ambulatory Visit: Payer: Self-pay | Admitting: Physician Assistant

## 2023-08-08 ENCOUNTER — Ambulatory Visit (INDEPENDENT_AMBULATORY_CARE_PROVIDER_SITE_OTHER)

## 2023-08-08 DIAGNOSIS — Z8673 Personal history of transient ischemic attack (TIA), and cerebral infarction without residual deficits: Secondary | ICD-10-CM

## 2023-08-08 LAB — CUP PACEART REMOTE DEVICE CHECK
Date Time Interrogation Session: 20250614231924
Implantable Pulse Generator Implant Date: 20220708

## 2023-08-09 ENCOUNTER — Ambulatory Visit: Payer: Self-pay | Admitting: Cardiology

## 2023-08-15 ENCOUNTER — Other Ambulatory Visit: Payer: Self-pay

## 2023-08-15 DIAGNOSIS — M818 Other osteoporosis without current pathological fracture: Secondary | ICD-10-CM

## 2023-08-15 MED ORDER — DENOSUMAB 60 MG/ML ~~LOC~~ SOSY
60.0000 mg | PREFILLED_SYRINGE | Freq: Once | SUBCUTANEOUS | Status: AC
  Administered 2023-09-21: 60 mg via SUBCUTANEOUS

## 2023-08-29 NOTE — Progress Notes (Unsigned)
 Claudia Rasmussen, female    DOB: June 08, 1931   MRN: [id number]   Brief patient profile:  65 yowf grew up  in [place] smoker in house and remembers having lots of ear infections but  quit smoking in her 34s with really no apparent sequelae and referred to pulmonary clinic 03/16/2023 by Dr Charlanne  for wheezing / sob   ? winter of 2023  > placed on advair  did not seem to help breathing then started cough daily which is really her greatest concern.   Exp to macrodantin stopped ? 2022    History of Present Illness  03/16/2023  Pulmonary/ 1st office eval/Tersa Fotopoulos  Chief Complaint  Patient presents with   Consult    Pt states she has had a cough for over a year has coughed up a  pale yellow substance after eating or laying down.   Dyspnea:  worse since onset 2 y prior to OV , esp exertion walking back from dinner slt up hill whereas prev no trouble doing the walk Cough: esp p or while eating > gobs of mucus but no food or anything obviously purulent  Sleep: immediately on lying down > pale yellow bed is flat with two pillows/ cough resolves p 3-4 minutes  with freq urinary incont SABA use: rescue inhaler helped  02 [id number]  Rec Stop advair  Ok to use albuterol  up to 4 hours as needed if you feel it's helpful Pantoprazole  (protonix ) 40 mg   Take  30-60 min before first meal of the day and Pepcid  (famotidine )  20 mg after supper until return to office  GERD diet reviewed, bed blocks rec  For cough > Mucinex dm 1200 mg every 12 hours as needed for cough/ congestion ad use flutter valve as much possible   MBS  ok  04/08/23  HRCT with multiple  findings but no ILD 03/30/23     05/13/2023  f/u ov/Rheba Diamond re: doe/cough  maint on ppi qam  and proair  bid (not prn) only uses p ex/ never prior   Chief Complaint  Patient presents with   Follow-up    Cough is much improved.   Dyspnea:  no change doe up hills with assoc wheeze Cough: none  Sleeping: ok flat  resp cc  SABA use: bid saba/ not prn  02: none   Rec Resume  Pantoprazole  (protonix ) 40 mg   Take  30-60 min before first meal of the day and Pepcid  (famotidine )  20 mg after supper until return to office  .   For cough > Mucinex dm 1200 mg every 12 hours as needed for cough/ congestion ad use flutter valve as much possible  For shortness of breath  / wheezing > Proair  (red) up to 2 puffs every hours as needed Also  Ok to try albuterol  15 min before an activity (on alternating days)  that you know would usually make you short of breath  Discuss your kidney scan with your kidney doctor and your aorta with your heart doctor   08/30/2023  f/u ov/Jeramyah Goodpasture re: doe   maint on ppi q am  can't remember to take pepcid    Chief Complaint  Patient presents with   Follow-up    Emphysema and Chronic Cough    Dysp[place]:  [place] to [place] s limiting doe  Cough: still urge to clear throat/no longer having trouble with meals  Sleeping:6-8 in slides down but no noct cough  SABA use: once  a month     No obvious day to day or daytime variability or assoc excess/ purulent sputum or mucus plugs or hemoptysis or cp or chest tightness, subjective wheeze or overt sinus or hb symptoms.    Also denies any obvious fluctuation of symptoms with weather or environmental changes or other aggravating or alleviating factors except as outlined above   No unusual exposure hx or h/o childhood pna/ asthma or knowledge of premature birth.  Current Allergies, Complete Past Medical History, Past Surgical History, Family History, and Social History were reviewed in [employer] record.  ROS  The following are not active complaints unless bolded Hoarseness, sore throat/sensation of pnds /globus, dysphagia, dental problems, itching, sneezing,  nasal congestion or discharge of excess mucus or purulent secretions, ear ache,   fever, chills, sweats, unintended wt loss or wt gain, classically pleuritic or exertional cp,  orthopnea pnd  or arm/hand swelling  or leg swelling, presyncope, palpitations, abdominal pain, anorexia, nausea, vomiting, diarrhea  or change in bowel habits or change in bladder habits, change in stools or change in urine, dysuria, hematuria,  rash, arthralgias, visual complaints, headache, numbness, weakness or ataxia or problems with walking or coordination,  change in mood or  memory.        Current Meds  Medication Sig   albuterol  (PROAIR  HFA) 108 (90 Base) MCG/ACT inhaler 2 puffs every 4 hours as needed only  if your can't catch your breath   atorvastatin  (LIPITOR) 40 MG tablet TAKE 1 TABLET DAILY   cetirizine  (ZYRTEC ) 10 MG tablet TAKE 1 TABLET AT BEDTIME   Cholecalciferol (VITAMIN D3) 25 MCG (1000 UT) CAPS Take 1,000 Units by mouth in the morning.   clopidogrel  (PLAVIX ) 75 MG tablet TAKE 1 TABLET DAILY   D-MANNOSE PO Take by mouth in the morning and at bedtime.   denosumab  (PROLIA ) 60 MG/ML SOSY injection Inject 60 mg into the skin every 6 (six) months.   famotidine  (PEPCID ) 20 MG tablet One after supper   fluticasone  (FLONASE ) 50 MCG/ACT nasal spray USE 2 SPRAYS IN EACH NOSTRIL DAILY   furosemide (LASIX) 20 MG tablet Take by mouth.   levothyroxine  (SYNTHROID ) 50 MCG tablet Take 1 tablet (50 mcg total) by mouth daily.   MAGNESIUM PO Take by mouth 3 (three) times daily. Pt says she takes 1 cap in morning and 2 caps at night   Melatonin-Pyridoxine (MELATIN PO) Take by mouth.   nebivolol  (BYSTOLIC ) 2.5 MG tablet TAKE 1 TABLET(2.5 MG) BY MOUTH DAILY   nitroGLYCERIN  (NITROSTAT ) 0.4 MG SL tablet Place 1 tablet (0.4 mg total) under the tongue every 5 (five) minutes as needed for chest pain.   pantoprazole  (PROTONIX ) 40 MG tablet Take 30-60 min before first meal of the day   Polyvinyl Alcohol-Povidone (REFRESH OP) Place 1 drop into both eyes in the morning and at bedtime.   Probiotic Product (PROBIOTIC-10 PO) Take 1 capsule by mouth daily.   Current Facility-Administered Medications for the 08/30/23  encounter (Office Visit) with Darlean Ozell NOVAK, MD  Medication   denosumab  (PROLIA ) injection 60 mg            Past Medical History:  Diagnosis Date   Age-related osteoporosis without current pathological fracture    Arthritis    Atrophy of thyroid  (acquired)    Benign hypertension 09/09/2020   Blepharitis 2021   Cholelithiases    Clostridium difficile infection    Diverticulitis    Diverticulitis of large intestine without perforation or abscess without  bleeding    Family history of colon cancer    Migraine without aura, not intractable, without status migrainosus    Mixed hyperlipidemia    Stroke [facility])    Thyroid  disease    UTI (urinary tract infection)    Vaginal prolapse    Vitamin D  deficiency, unspecified       Objective:    Wts  08/30/2023          129   05/13/2023       127   03/16/23 131 lb (59.4 kg)  01/04/23 130 lb (59 kg)  11/29/22 127 lb 9.6 oz (57.9 kg)    Vital signs reviewed  08/30/2023  - Note at rest 02 sats  94% on RA   General appearance:    amb pleasant wf/ frequent throat clearing     HEENT : Oropharynx  pristine  Nasal turbinates nl    NECK :  without  apparent JVD/ palpable Nodes/TM    LUNGS: no acc muscle use,  Min barrel  contour chest wall with bilateral  slightly decreased bs s audible wheeze and  without cough on insp or exp maneuvers and min  Hyperresonant  to  percussion bilaterally    CV:  RRR  no s3 or murmur or increase in P2, and no edema   ABD:  soft and nontender    MS:  Nl gait/ ext warm without deformities Or obvious joint restrictions  calf tenderness, cyanosis or clubbing     SKIN: warm and dry without lesions    NEURO:  alert, approp, nl sensorium with  no motor or cerebellar deficits apparent.              Assessment

## 2023-08-30 ENCOUNTER — Ambulatory Visit (INDEPENDENT_AMBULATORY_CARE_PROVIDER_SITE_OTHER): Admitting: Internal Medicine

## 2023-08-30 ENCOUNTER — Encounter: Payer: Self-pay | Admitting: Internal Medicine

## 2023-08-30 VITALS — BP 114/70 | HR 67 | Temp 97.4°F | Ht 63.0 in | Wt 129.4 lb

## 2023-08-30 DIAGNOSIS — Z87891 Personal history of nicotine dependence: Secondary | ICD-10-CM

## 2023-08-30 DIAGNOSIS — R053 Chronic cough: Secondary | ICD-10-CM | POA: Diagnosis not present

## 2023-08-30 LAB — CBC WITH DIFFERENTIAL/PLATELET
Basophils Absolute: 0.1 K/uL (ref 0.0–0.1)
Basophils Relative: 1 % (ref 0.0–3.0)
Eosinophils Absolute: 0.2 K/uL (ref 0.0–0.7)
Eosinophils Relative: 3.1 % (ref 0.0–5.0)
HCT: 42.2 % (ref 36.0–46.0)
Hemoglobin: 13.9 g/dL (ref 12.0–15.0)
Lymphocytes Relative: 25.6 % (ref 12.0–46.0)
Lymphs Abs: 1.4 K/uL (ref 0.7–4.0)
MCHC: 32.9 g/dL (ref 30.0–36.0)
MCV: 94 fl (ref 78.0–100.0)
Monocytes Absolute: 0.5 K/uL (ref 0.1–1.0)
Monocytes Relative: 8.9 % (ref 3.0–12.0)
Neutro Abs: 3.5 K/uL (ref 1.4–7.7)
Neutrophils Relative %: 61.4 % (ref 43.0–77.0)
Platelets: 173 K/uL (ref 150.0–400.0)
RBC: 4.49 Mil/uL (ref 3.87–5.11)
RDW: 13.9 % (ref 11.5–15.5)
WBC: 5.7 K/uL (ref 4.0–10.5)

## 2023-08-30 NOTE — Patient Instructions (Signed)
 Please remember to go to the lab department   for your tests - we will call you with the results when they are available.      No change in medications.   Pulmonary follow up can be arranged as needed

## 2023-08-30 NOTE — Assessment & Plan Note (Signed)
 Onset ? Winter 2024 p onset of sob ? While on macrodantin chronically  during 2022  - assoc with meals but no clear aspiration events >  MBS >>>  - HRCT to r/o ILD vs bronchiectasis  03/16/2023  - max rx for gerd 03/16/2023 >>>  - [facility]  04/08/23 recs Swallowing Evaluation Recommendations  Regular; Thin liquids (Level 0) Liquid Administration via: Cup; Straw Medication Administration: Whole meds with liquid Supervision: Patient able to self-feed - HRCT   03/30/23  1. Severe emphysema. 2. Minimal, bland appearing scarring in the bilateral lung bases. Minimal tubular bronchiectasis in the lung bases. No evidence of fibrotic interstitial lung disease. 3. Soft tissue attenuation lesion of the anterior midportion of the left kidney measuring 2.8 x 2.8 cm, concerning for mass. Recommend dedicated renal protocol CT or MRI to further evaluate if clinically appropriate given patient age.> directed to her kidney doctor 05/13/2023 >>>  4. Enlargement of the tubular ascending thoracic aorta, measuring up to 4.4 x 4.3 cm.  > directed to her cardiologist  05/13/2023 >>>   5. Coronary artery disease. 6. Cholelithiasis. Aortic Atherosclerosis (ICD10-I70.0) and Emphysema (ICD10-J43.9) - Allergy screen 08/30/2023 >  Eos 0. /  IgE pending  If pos for allergy > allergy consultation  If neg > consider gabapenti starting at 100 mg q hs and ittrating up to max of 1200 mg per day if needed to eliminate throat clearing vs ENT eval next  Discussed in detail all the  indications, usual  risks and alternatives  relative to the benefits with patient who agrees to proceed with w/u and  Rx as outlined.    Pulmonary f/u can be prn            Each maintenance medication was reviewed in detail including emphasizing most importantly the difference between maintenance and prns and under what circumstances the prns are to be triggered using an action plan format where appropriate.  Total time for H and P, chart review,  counseling,  and generating customized AVS unique to this office visit / same day charting = 22 min summary  final f/u ov

## 2023-08-30 NOTE — Progress Notes (Signed)
 [facility] Summary Report / Loop Recorder

## 2023-08-31 ENCOUNTER — Ambulatory Visit: Payer: Self-pay | Admitting: Internal Medicine

## 2023-08-31 LAB — IGE: IgE (Immunoglobulin E), Serum: 33 kU/L (ref <=114)

## 2023-09-01 ENCOUNTER — Telehealth: Payer: Self-pay

## 2023-09-01 ENCOUNTER — Other Ambulatory Visit: Payer: Self-pay

## 2023-09-01 DIAGNOSIS — E038 Other specified hypothyroidism: Secondary | ICD-10-CM

## 2023-09-01 NOTE — Telephone Encounter (Signed)
 LM for patient to return call to schedule her Prolia  shot - she needs to come in 7/25 or after

## 2023-09-01 NOTE — Progress Notes (Signed)
 I called and spoke to Claudia Rasmussen. Claudia Rasmussen informed of Dr Chari note and verbalized understanding. NFN

## 2023-09-01 NOTE — Telephone Encounter (Signed)
 Left voicemail to check TSH that day. Order is in.  Copied from CRM [id number]. Topic: Clinical - Request for Lab/Test Order >> Sep 01, 2023  4:12 PM Ivette P wrote: Reason for CRM: Pt called in because would like to get lab order about thyroid . Pt would like to have the lab done 07/25 during prolia  shot.    Pls follow up with pt [phone]

## 2023-09-02 ENCOUNTER — Other Ambulatory Visit: Payer: Self-pay | Admitting: Family Medicine

## 2023-09-08 ENCOUNTER — Ambulatory Visit (INDEPENDENT_AMBULATORY_CARE_PROVIDER_SITE_OTHER)

## 2023-09-08 DIAGNOSIS — Z8673 Personal history of transient ischemic attack (TIA), and cerebral infarction without residual deficits: Secondary | ICD-10-CM | POA: Diagnosis not present

## 2023-09-08 LAB — CUP PACEART REMOTE DEVICE CHECK
Date Time Interrogation Session: 20250716233021
Implantable Pulse Generator Implant Date: 20220708

## 2023-09-10 ENCOUNTER — Ambulatory Visit: Payer: Self-pay | Admitting: Cardiology

## 2023-09-15 NOTE — Progress Notes (Signed)
 [facility] Summary Report / Loop Recorder

## 2023-09-16 ENCOUNTER — Ambulatory Visit

## 2023-09-19 ENCOUNTER — Ambulatory Visit

## 2023-09-21 ENCOUNTER — Ambulatory Visit (INDEPENDENT_AMBULATORY_CARE_PROVIDER_SITE_OTHER)

## 2023-09-21 ENCOUNTER — Other Ambulatory Visit: Payer: Self-pay | Admitting: Family Medicine

## 2023-09-21 DIAGNOSIS — E038 Other specified hypothyroidism: Secondary | ICD-10-CM | POA: Diagnosis not present

## 2023-09-21 DIAGNOSIS — M818 Other osteoporosis without current pathological fracture: Secondary | ICD-10-CM | POA: Diagnosis not present

## 2023-09-21 MED ORDER — DENOSUMAB 60 MG/ML ~~LOC~~ SOSY: 60.0000 mg | PREFILLED_SYRINGE | Freq: Once | SUBCUTANEOUS | Status: DC

## 2023-09-21 NOTE — Progress Notes (Signed)
   Patient: Claudia Rasmussen  DOB: 1931/10/03  MRN: [id number]    Visit Date: 09/21/2023    [name] presents today for her six month Prolia  injection.  1 x 60 mg Single-Dose Prefilled Syringe was given SQ in the left arm.  Patient tolerated the injection well and has no questions.  The next Prolia  injection will be due in six months.  Administrations This Visit     denosumab  (PROLIA ) injection 60 mg     Admin Date 09/21/2023 Action Given Dose 60 mg Route Subcutaneous Documented By Forest Haskell LABOR, CMA              Avyon Herendeen A Bea Duren, CMA

## 2023-09-22 DIAGNOSIS — S93602A Unspecified sprain of left foot, initial encounter: Secondary | ICD-10-CM | POA: Diagnosis not present

## 2023-09-22 DIAGNOSIS — S92355A Nondisplaced fracture of fifth metatarsal bone, left foot, initial encounter for closed fracture: Secondary | ICD-10-CM | POA: Diagnosis not present

## 2023-09-22 DIAGNOSIS — X58XXXA Exposure to other specified factors, initial encounter: Secondary | ICD-10-CM | POA: Diagnosis not present

## 2023-09-22 LAB — TSH: TSH: 6.28 u[IU]/mL — ABNORMAL HIGH (ref 0.450–4.500)

## 2023-09-25 ENCOUNTER — Ambulatory Visit: Payer: Self-pay | Admitting: Family Medicine

## 2023-09-25 DIAGNOSIS — E038 Other specified hypothyroidism: Secondary | ICD-10-CM

## 2023-09-25 MED ORDER — LEVOTHYROXINE SODIUM 88 MCG PO TABS: 88.0000 ug | ORAL_TABLET | Freq: Every day | ORAL | 0 refills | Status: DC

## 2023-09-26 ENCOUNTER — Telehealth: Payer: Self-pay

## 2023-09-26 NOTE — Telephone Encounter (Signed)
 Pt needs to schedule appt with our office for referral

## 2023-09-26 NOTE — Telephone Encounter (Signed)
 Message sent via [employer]

## 2023-09-26 NOTE — Telephone Encounter (Signed)
 Copied from CRM [id number]. Topic: Referral - Request for Referral >> Sep 23, 2023  9:05 AM Emylou G wrote: Did the patient discuss referral with their provider in the last year? No (If No - schedule appointment) (If Yes - send message)  Appointment offered? No  Type of order/referral and detailed reason for visit: Need Orthopedic referral - she broke her 5th metarcel on the left foot.. unable to move/drive.    Preference of office, provider, location: no  If referral order, have you been seen by this specialty before? No (If Yes, this issue or another issue? When? Where?  Can we respond through MyChart? Yes

## 2023-09-27 ENCOUNTER — Ambulatory Visit (INDEPENDENT_AMBULATORY_CARE_PROVIDER_SITE_OTHER): Admitting: Physician Assistant

## 2023-09-27 VITALS — BP 116/72 | HR 84 | Temp 98.3°F | Ht 63.0 in | Wt 132.6 lb

## 2023-09-27 DIAGNOSIS — S92355D Nondisplaced fracture of fifth metatarsal bone, left foot, subsequent encounter for fracture with routine healing: Secondary | ICD-10-CM | POA: Insufficient documentation

## 2023-09-27 NOTE — Progress Notes (Signed)
 Acute Office Visit  Subjective:    Patient ID: Claudia Rasmussen, female    DOB: Mar 05, 1931, 89 y.o.   MRN: [id number]  Chief Complaint  Patient presents with   Foot Injury    HPI: Patient is in today for referral to ortho for fracture of her left 5th proximal metatarsal.  She would like to see ortho in [place]. She states last Thursday she lost her footing while turning to look another way and fell.  She was seen at Urgent Care and diagnosed with the fracture (confirmed with xray report in chart) She had a boot at home and is wearing that now   Current Outpatient Medications:    albuterol  (PROAIR  HFA) 108 (90 Base) MCG/ACT inhaler, 2 puffs every 4 hours as needed only  if your can't catch your breath, Disp: 59.5 g, Rfl: 2   atorvastatin  (LIPITOR) 40 MG tablet, TAKE 1 TABLET DAILY, Disp: 90 tablet, Rfl: 1   cetirizine  (ZYRTEC ) 10 MG tablet, TAKE 1 TABLET AT BEDTIME, Disp: 90 tablet, Rfl: 3   Cholecalciferol (VITAMIN D3) 25 MCG (1000 UT) CAPS, Take 1,000 Units by mouth in the morning., Disp: , Rfl:    clopidogrel  (PLAVIX ) 75 MG tablet, TAKE 1 TABLET DAILY, Disp: 90 tablet, Rfl: 1   D-MANNOSE PO, Take by mouth in the morning and at bedtime., Disp: , Rfl:    denosumab  (PROLIA ) 60 MG/ML SOSY injection, Inject 60 mg into the skin every 6 (six) months., Disp: , Rfl:    famotidine  (PEPCID ) 20 MG tablet, One after supper, Disp: 90 tablet, Rfl: 2   fluticasone  (FLONASE ) 50 MCG/ACT nasal spray, USE 2 SPRAYS IN EACH NOSTRIL DAILY, Disp: 48 g, Rfl: 3   furosemide (LASIX) 20 MG tablet, Take by mouth., Disp: , Rfl:    levothyroxine  (SYNTHROID ) 88 MCG tablet, Take 1 tablet (88 mcg total) by mouth daily., Disp: 90 tablet, Rfl: 0   MAGNESIUM PO, Take by mouth 3 (three) times daily. Pt says she takes 1 cap in morning and 2 caps at night, Disp: , Rfl:    Melatonin-Pyridoxine (MELATIN PO), Take by mouth., Disp: , Rfl:    nebivolol  (BYSTOLIC ) 2.5 MG tablet, TAKE 1 TABLET(2.5 MG) BY MOUTH DAILY, Disp: 90  tablet, Rfl: 3   nitroGLYCERIN  (NITROSTAT ) 0.4 MG SL tablet, Place 1 tablet (0.4 mg total) under the tongue every 5 (five) minutes as needed for chest pain., Disp: 25 tablet, Rfl: 3   pantoprazole  (PROTONIX ) 40 MG tablet, Take 30-60 min before first meal of the day, Disp: 90 tablet, Rfl: 2   Polyvinyl Alcohol-Povidone (REFRESH OP), Place 1 drop into both eyes in the morning and at bedtime., Disp: , Rfl:    Probiotic Product (PROBIOTIC-10 PO), Take 1 capsule by mouth daily., Disp: , Rfl:   Current Facility-Administered Medications:    [START ON 03/19/2024] denosumab  (PROLIA ) injection 60 mg, 60 mg, Subcutaneous, Once, [name], [name]  Allergies  Allergen Reactions   Shellfish Allergy Other (See Comments)    Itching, Hives Itching, Hives   Nitrofuran Derivatives Other (See Comments)    Made her not feel good, caused tremors, increased creatnine   Azithromycin Rash   Cephalosporins Other (See Comments) and Dermatitis    Itching  Itching   Itching    Itching     Itching   Levofloxacin Dermatitis   Moxifloxacin Hcl Rash   Phenobarbital Rash    ROS CONSTITUTIONAL: Negative for chills, fatigue, fever,  CARDIOVASCULAR: Negative for chest pain, RESPIRATORY: Negative for recent  cough and dyspnea.   MSK: see HPI INTEGUMENTARY: Negative for rash.       Objective:    PHYSICAL EXAM:   BP 116/72   Pulse 84   Temp 98.3 F (36.8 C)   Ht 5' 3 (1.6 m)   Wt 132 lb 9.6 oz (60.1 kg)   SpO2 96%   BMI 23.49 kg/m    GEN: Well nourished, well developed, in no acute distress   Cardiac: RRR; no murmurs, Respiratory:  normal respiratory rate and pattern with no distress - normal breath sounds with no rales, rhonchi, wheezes or rubs  FD:ozqu foot in boot Skin: warm and dry, no rash       Assessment & Plan:    Closed nondisplaced fracture of fifth metatarsal bone of left foot with routine healing, subsequent encounter -     Ambulatory referral to Orthopedic Surgery      Follow-up: Return for as scheduled for next chronic visit.  An After Visit Summary was printed and given to the patient.  [name] [facility] [phone]

## 2023-10-03 ENCOUNTER — Other Ambulatory Visit: Payer: Self-pay | Admitting: Physician Assistant

## 2023-10-10 ENCOUNTER — Ambulatory Visit (INDEPENDENT_AMBULATORY_CARE_PROVIDER_SITE_OTHER)

## 2023-10-10 DIAGNOSIS — Z8673 Personal history of transient ischemic attack (TIA), and cerebral infarction without residual deficits: Secondary | ICD-10-CM | POA: Diagnosis not present

## 2023-10-11 ENCOUNTER — Ambulatory Visit (HOSPITAL_BASED_OUTPATIENT_CLINIC_OR_DEPARTMENT_OTHER)
Admission: RE | Admit: 2023-10-11 | Discharge: 2023-10-11 | Disposition: A | Discharge status: Home / Self Care | Source: Ambulatory Visit

## 2023-10-11 ENCOUNTER — Ambulatory Visit (INDEPENDENT_AMBULATORY_CARE_PROVIDER_SITE_OTHER)

## 2023-10-11 ENCOUNTER — Ambulatory Visit: Admitting: Cardiology

## 2023-10-11 VITALS — BP 130/86 | Ht 63.5 in | Wt 126.0 lb

## 2023-10-11 VITALS — BP 136/84 | HR 62 | Ht 63.5 in | Wt 126.6 lb

## 2023-10-11 DIAGNOSIS — S92355A Nondisplaced fracture of fifth metatarsal bone, left foot, initial encounter for closed fracture: Secondary | ICD-10-CM | POA: Diagnosis not present

## 2023-10-11 DIAGNOSIS — E782 Mixed hyperlipidemia: Secondary | ICD-10-CM | POA: Diagnosis not present

## 2023-10-11 DIAGNOSIS — M2142 Flat foot [pes planus] (acquired), left foot: Secondary | ICD-10-CM | POA: Diagnosis not present

## 2023-10-11 DIAGNOSIS — S92355D Nondisplaced fracture of fifth metatarsal bone, left foot, subsequent encounter for fracture with routine healing: Secondary | ICD-10-CM

## 2023-10-11 DIAGNOSIS — I2510 Atherosclerotic heart disease of native coronary artery without angina pectoris: Secondary | ICD-10-CM | POA: Diagnosis not present

## 2023-10-11 DIAGNOSIS — M25572 Pain in left ankle and joints of left foot: Secondary | ICD-10-CM | POA: Diagnosis not present

## 2023-10-11 LAB — CUP PACEART REMOTE DEVICE CHECK
Date Time Interrogation Session: 20250816231709
Implantable Pulse Generator Implant Date: 20220708

## 2023-10-11 NOTE — Patient Instructions (Signed)

## 2023-10-11 NOTE — Progress Notes (Signed)
 Subjective: Chief complaint: Fracture of the proximal fifth metatarsal  Claudia Rasmussen is a 89 year old female with osteoporosis on prolia  & CKD 3b who reports having an incident on 09/22/2023 where she was walking on the grounds of her retirement home at [place] and while looking sideways had a misstep with her left foot, experiencing immediate pain, and proceeded to limp her way back inside. She did not fall. X-rays at [facility] urgent care that same day: There is a non-displaced fracture involving the proximal fifth metatarsal.  The remaining metatarsal and tarsal bones are intact.   The phalanges appear normal.  The subtalar joint is unremarkable.  There is mild soft tissue swelling about the fracture.  I have reviewed the notes from the [facility] encounter 09/22/23. I am unable to view these images directly. Patient was placed in a short cam boot and sent to PCP for follow-up.  Today she reports using Tylenol  intermittently for pain though still has an ache in the area. Has been compliant with fracture boot during that time with the exception of going to the bathroom at night.  Objective: Physical exam Left foot - No overlying bruising. No obvious deformity. TTP overlying the 5th metatarsal shaft.   Three-view plain from radiographs obtained of the left foot today provide independent evaluation revealing a nondisplaced transverse fracture of the proximal diaphysis of the fifth metatarsal.  No appreciable bony callus formation.  Assessment/plan: Claudia Rasmussen is a very pleasant 89 year old female with osteoporosis & CKD3b presenting for follow up on a 5th metatarsal fracture. She has been compliant with a short fracture boot since the date of injury.  Films today show nondisplaced transverse fracture of fifth metatarsal diaphysis.  Counseled patient on protracted timeline of expected healing.  Given her age, I do not believe she could tolerate crutches and thus a short fx boot is  most appropriate for her at this time.  Provided postop shoe for some semblance of protected weight bearing when going to bathroom at night. Will follow-up in 3 weeks with repeat x-rays to assess interval healing.

## 2023-10-14 ENCOUNTER — Ambulatory Visit: Payer: Self-pay | Admitting: Cardiology

## 2023-10-14 NOTE — Progress Notes (Signed)
 Cardiology Office Note:    Date:  10/14/2023   ID:  Claudia Rasmussen, DOB 10-24-1931, MRN [id number]  PCP:  [name], [name]  Cardiologist:  Teniya Filter, DO  Electrophysiologist:  None   Referring MD: [name], [name]    I am doing well  History of Present Illness:    Claudia Rasmussen is a 89 y.o. female with a hx of nonobstructive CAD, heart disease, vitamin D  deficiency here today for follow-up visit.  Today she tells me that she had had chest pain several months ago took nitroglycerin  and it did not help.  But she has not had any recurrent pain.  What is most bothersome is the fact that the patient tells me she has been experiencing some coughing.  She reports that this happening mostly at after she eats and she gets these extensive cough.  She was started on Pepcid  but it did not seem to help.  We talked to her PCP to hopefully start Protonix  in this patient to see if this can help.  She is very concerned about this.  Past Medical History:  Diagnosis Date   Age-related osteoporosis without current pathological fracture    Arthritis    Atrophy of thyroid  (acquired)    Benign hypertension 09/09/2020   Blepharitis 2021   Cholelithiases    Clostridium difficile infection    Diverticulitis    Diverticulitis of large intestine without perforation or abscess without bleeding    Family history of colon cancer    Migraine without aura, not intractable, without status migrainosus    Mixed hyperlipidemia    Stroke (HCC)    Thyroid  disease    UTI (urinary tract infection)    Vaginal prolapse    Vitamin D  deficiency, unspecified     Past Surgical History:  Procedure Laterality Date   ABDOMINAL HYSTERECTOMY     APPENDECTOMY  1969   CATARACT EXTRACTION, BILATERAL Bilateral    right- 03/22/2018 and left 05/10/2018   COLONOSCOPY  05/12/2009   Colonic polyp status post polypectomy. Moderate predominanltly left colonic diverticulosis. Internal hemorrhoids   LEFT HEART CATH AND  CORONARY ANGIOGRAPHY N/A 10/14/2020   Procedure: LEFT HEART CATH AND CORONARY ANGIOGRAPHY;  Surgeon: Dann Candyce RAMAN, MD;  Location: [facility] INVASIVE CV LAB;  Service: Cardiovascular;  Laterality: N/A;   LOOP RECORDER INSERTION N/A 08/29/2020   Procedure: LOOP RECORDER INSERTION;  Surgeon: Cindie Ole DASEN, MD;  Location: [facility] INVASIVE CV LAB;  Service: Cardiovascular;  Laterality: N/A;   MOHS SURGERY  2023   Forehead   OOPHORECTOMY Left     Current Medications: Current Meds  Medication Sig   albuterol  (PROAIR  HFA) 108 (90 Base) MCG/ACT inhaler 2 puffs every 4 hours as needed only  if your can't catch your breath   atorvastatin  (LIPITOR) 40 MG tablet TAKE 1 TABLET DAILY   cetirizine  (ZYRTEC ) 10 MG tablet TAKE 1 TABLET AT BEDTIME   Cholecalciferol (VITAMIN D3) 25 MCG (1000 UT) CAPS Take 1,000 Units by mouth in the morning.   clopidogrel  (PLAVIX ) 75 MG tablet TAKE 1 TABLET DAILY   D-MANNOSE PO Take by mouth in the morning and at bedtime.   denosumab  (PROLIA ) 60 MG/ML SOSY injection Inject 60 mg into the skin every 6 (six) months.   famotidine  (PEPCID ) 20 MG tablet One after supper   fluticasone  (FLONASE ) 50 MCG/ACT nasal spray USE 2 SPRAYS IN EACH NOSTRIL DAILY   furosemide (LASIX) 20 MG tablet Take by mouth.   levothyroxine  (SYNTHROID ) 88 MCG tablet Take 1  tablet (88 mcg total) by mouth daily.   MAGNESIUM PO Take by mouth 3 (three) times daily. Pt says she takes 1 cap in morning and 2 caps at night   Melatonin-Pyridoxine (MELATIN PO) Take by mouth.   nebivolol  (BYSTOLIC ) 2.5 MG tablet TAKE 1 TABLET(2.5 MG) BY MOUTH DAILY   nitroGLYCERIN  (NITROSTAT ) 0.4 MG SL tablet Place 1 tablet (0.4 mg total) under the tongue every 5 (five) minutes as needed for chest pain.   pantoprazole  (PROTONIX ) 40 MG tablet Take 30-60 min before first meal of the day   Polyvinyl Alcohol-Povidone (REFRESH OP) Place 1 drop into both eyes in the morning and at bedtime.   Probiotic Product (PROBIOTIC-10 PO) Take 1  capsule by mouth daily.   Current Facility-Administered Medications for the 10/11/23 encounter (Office Visit) with Mailyn Steichen, DO  Medication   [START ON 03/19/2024] denosumab  (PROLIA ) injection 60 mg     Allergies:   Shellfish allergy, Nitrofuran derivatives, Azithromycin, Cephalosporins, Levofloxacin, Moxifloxacin hcl, and Phenobarbital   Social History   Socioeconomic History   Marital status: Widowed    Spouse name: Not on file   Number of children: 3   Years of education: Not on file   Highest education level: Bachelor's degree (e.g., BA, AB, BS)  Occupational History   Not on file  Tobacco Use   Smoking status: Former   Smokeless tobacco: Never  Vaping Use   Vaping status: Never Used  Substance and Sexual Activity   Alcohol use: Yes    Comment: Drinks alcohol very infrequently. She typically consumes white wine.   Drug use: Never   Sexual activity: Not on file  Other Topics Concern   Not on file  Social History Narrative   [facility] at [facility]   Social Drivers of Health   Financial Resource Strain: Low Risk  (09/27/2023)   Overall Financial Resource Strain (CARDIA)    Difficulty of Paying Living Expenses: Not very hard  Food Insecurity: No Food Insecurity (09/27/2023)   Hunger Vital Sign    Worried About Running Out of Food in the Last Year: Never true    Ran Out of Food in the Last Year: Never true  Transportation Needs: No Transportation Needs (09/27/2023)   PRAPARE - Administrator, Civil Service (Medical): No    Lack of Transportation (Non-Medical): No  Physical Activity: Insufficiently Active (09/27/2023)   Exercise Vital Sign    Days of Exercise per Week: 6 days    Minutes of Exercise per Session: 10 min  Stress: No Stress Concern Present (09/27/2023)   [employer] of Occupational Health - Occupational Stress Questionnaire    Feeling of Stress: Only a little  Social Connections: Moderately Integrated (09/27/2023)   Social  Connection and Isolation Panel    Frequency of Communication with Friends and Family: More than three times a week    Frequency of Social Gatherings with Friends and Family: More than three times a week    Attends Religious Services: More than 4 times per year    Active Member of [employer] or Organizations: Yes    Attends Banker Meetings: More than 4 times per year    Marital Status: Widowed     Family History: The patient's family history includes Breast cancer in her mother; Colon cancer in her father; Coronary artery disease in an other family member; Hypertension in an other family member; Prostate cancer in her brother. There is no history of Esophageal cancer, Rectal cancer, or  Stomach cancer.  ROS:   Review of Systems  Constitution: Negative for decreased appetite, fever and weight gain.  HENT: Negative for congestion, ear discharge, hoarse voice and sore throat.   Eyes: Negative for discharge, redness, vision loss in right eye and visual halos.  Cardiovascular: Negative for chest pain, dyspnea on exertion, leg swelling, orthopnea and palpitations.  Respiratory: Negative for cough, hemoptysis, shortness of breath and snoring.   Endocrine: Negative for heat intolerance and polyphagia.  Hematologic/Lymphatic: Negative for bleeding problem. Does not bruise/bleed easily.  Skin: Negative for flushing, nail changes, rash and suspicious lesions.  Musculoskeletal: Negative for arthritis, joint pain, muscle cramps, myalgias, neck pain and stiffness.  Gastrointestinal: Negative for abdominal pain, bowel incontinence, diarrhea and excessive appetite.  Genitourinary: Negative for decreased libido, genital sores and incomplete emptying.  Neurological: Negative for brief paralysis, focal weakness, headaches and loss of balance.  Psychiatric/Behavioral: Negative for altered mental status, depression and suicidal ideas.  Allergic/Immunologic: Negative for HIV exposure and persistent  infections.    EKGs/Labs/Other Studies Reviewed:    The following studies were reviewed today:   EKG:  The ekg ordered today demonstrates [facility] 10/14/2020   Mid LAD lesion is 40% stenosed.   Prox RCA lesion is 25% stenosed.   LV end diastolic pressure is low.   There is no aortic valve stenosis.  Medical therapy for mild to moderate CAD.  Continue aggressive secondary prevention.  Hydration post procedure for renal insufficiency.    Echo 08/28/2020 IMPRESSIONS   1. Left ventricular ejection fraction, by estimation, is 45 to 50%. The left ventricle has mildly decreased function. The left ventricle  demonstrates global hypokinesis. Left ventricular diastolic parameters are  consistent with Grade I diastolic  dysfunction (impaired relaxation).   2. Right ventricular systolic function is normal. The right ventricular  size is normal. There is normal pulmonary artery systolic pressure. The  estimated right ventricular systolic pressure is 20.3 mmHg.   3. The mitral valve is normal in structure. Trivial mitral valve  regurgitation. No evidence of mitral stenosis.   4. The aortic valve is tricuspid. Aortic valve regurgitation is mild. No  aortic stenosis is present.   5. Aortic dilatation noted. There is mild dilatation of the ascending  aorta, measuring 43 mm.   6. The inferior vena cava is normal in size with greater than 50%  respiratory variability, suggesting right atrial pressure of 3 mmHg.   FINDINGS   Left Ventricle: Left ventricular ejection fraction, by estimation, is 45  to 50%. The left ventricle has mildly decreased function. The left  ventricle demonstrates global hypokinesis. The left ventricular internal  cavity size was normal in size. There is   no left ventricular hypertrophy. Left ventricular diastolic parameters  are consistent with Grade I diastolic dysfunction (impaired relaxation).   Right Ventricle: The right ventricular size is normal. No increase in  right  ventricular wall thickness. Right ventricular systolic function is  normal. There is normal pulmonary artery systolic pressure. The tricuspid  regurgitant velocity is 2.08 m/s, and   with an assumed right atrial pressure of 3 mmHg, the estimated right  ventricular systolic pressure is 20.3 mmHg.   Left Atrium: Left atrial size was normal in size.   Right Atrium: Right atrial size was normal in size.   Pericardium: There is no evidence of pericardial effusion.   Mitral Valve: The mitral valve is normal in structure. Trivial mitral  valve regurgitation. No evidence of mitral valve stenosis.   Tricuspid Valve: The  tricuspid valve is normal in structure. Tricuspid  valve regurgitation is trivial.   Aortic Valve: The aortic valve is tricuspid. Aortic valve regurgitation is  mild. Aortic regurgitation PHT measures 449 msec. No aortic stenosis is  present.   Pulmonic Valve: The pulmonic valve was normal in structure. Pulmonic valve  regurgitation is not visualized.   Aorta: The aortic root is normal in size and structure and aortic  dilatation noted. There is mild dilatation of the ascending aorta,  measuring 43 mm.   Venous: The inferior vena cava is normal in size with greater than 50%  respiratory variability, suggesting right atrial pressure of 3 mmHg.   IAS/Shunts: No atrial level shunt detected by color flow Doppler.       Recent Labs: 05/31/2023: ALT 26; BUN 31; Creatinine, Ser 1.36; Magnesium 2.1; Potassium 5.0; Sodium 142 08/30/2023: Hemoglobin 13.9; Platelets 173.0 09/21/2023: TSH 6.280  Recent Lipid Panel    Component Value Date/Time   CHOL 160 05/31/2023 0944   TRIG 62 05/31/2023 0944   HDL 70 05/31/2023 0944   CHOLHDL 2.3 05/31/2023 0944   CHOLHDL 2.7 08/28/2020 0351   VLDL 17 08/28/2020 0351   LDLCALC 78 05/31/2023 0944    Physical Exam:    VS:  BP 136/84   Pulse 62   Ht 5' 3.5 (1.613 m)   Wt 126 lb 9.6 oz (57.4 kg)   SpO2 96%   BMI 22.07 kg/m     Wt  Readings from Last 3 Encounters:  10/11/23 126 lb (57.2 kg)  10/11/23 126 lb 9.6 oz (57.4 kg)  09/27/23 132 lb 9.6 oz (60.1 kg)     GEN: Well nourished, well developed in no acute distress HEENT: Normal NECK: No JVD; No carotid bruits LYMPHATICS: No lymphadenopathy CARDIAC: S1S2 noted,RRR, no murmurs, rubs, gallops RESPIRATORY:  Clear to auscultation without rales, wheezing or rhonchi  ABDOMEN: Soft, non-tender, non-distended, +bowel sounds, no guarding. EXTREMITIES: No edema, No cyanosis, no clubbing MUSCULOSKELETAL:  No deformity  SKIN: Warm and dry NEUROLOGIC:  Alert and oriented x 3, non-focal PSYCHIATRIC:  Normal affect, good insight  ASSESSMENT:    1. Coronary artery disease involving native coronary artery of native heart without angina pectoris   2. Mixed hyperlipidemia     PLAN:     CAD-no anginal symptoms.  She seems to be doing well from a cardiovascular standpoint.. Hyperlipidemia - continue with current statin medication. Blood pressure is acceptable, continue with current antihypertensive regimen. Fully compensated.  No signs of heart failure.  Continue beta-blocker. Mild MR - on signs of heart failure    The patient is in agreement with the above plan. The patient left the office in stable condition.  The patient will follow up in 1 year.   Medication Adjustments/Labs and Tests Ordered: Current medicines are reviewed at length with the patient today.  Concerns regarding medicines are outlined above.  Orders Placed This Encounter  Procedures   EKG 12-Lead    No orders of the defined types were placed in this encounter.    Patient Instructions  Medication Instructions:  Your physician recommends that you continue on your current medications as directed. Please refer to the Current Medication list given to you today.  *If you need a refill on your cardiac medications before your next appointment, please call your pharmacy*   Follow-Up: At [facility], you and your health needs are our priority.  As part of our continuing mission to provide you with exceptional heart care, our  providers are all part of one team.  This team includes your primary Cardiologist (physician) and Advanced Practice Providers or APPs (Physician Assistants and Nurse Practitioners) who all work together to provide you with the care you need, when you need it.  Your next appointment:   1 year(s)  Provider:   Dalbert Stillings, DO          Adopting a Healthy Lifestyle.  Know what a healthy weight is for you (roughly BMI <25) and aim to maintain this   Aim for 7+ servings of fruits and vegetables daily   65-80+ fluid ounces of water or unsweet tea for healthy kidneys   Limit to max 1 drink of alcohol per day; avoid smoking/tobacco   Limit animal fats in diet for cholesterol and heart health - choose grass fed whenever available   Avoid highly processed foods, and foods high in saturated/trans fats   Aim for low stress - take time to unwind and care for your mental health   Aim for 150 min of moderate intensity exercise weekly for heart health, and weights twice weekly for bone health   Aim for 7-9 hours of sleep daily   When it comes to diets, agreement about the perfect plan isnt easy to find, even among the experts. Experts at the [facility] of [employer] developed an idea known as the Healthy Eating Plate. Just imagine a plate divided into logical, healthy portions.   The emphasis is on diet quality:   Load up on vegetables and fruits - one-half of your plate: Aim for color and variety, and remember that potatoes dont count.   Go for whole grains - one-quarter of your plate: Whole wheat, barley, wheat berries, quinoa, oats, brown rice, and foods made with them. If you want pasta, go with whole wheat pasta.   Protein power - one-quarter of your plate: Fish, chicken, beans, and nuts are all healthy, versatile protein sources. Limit  red meat.   The diet, however, does go beyond the plate, offering a few other suggestions.   Use healthy plant oils, such as olive, canola, soy, corn, sunflower and peanut. Check the labels, and avoid partially hydrogenated oil, which have unhealthy trans fats.   If youre thirsty, drink water. Coffee and tea are good in moderation, but skip sugary drinks and limit milk and dairy products to one or two daily servings.   The type of carbohydrate in the diet is more important than the amount. Some sources of carbohydrates, such as vegetables, fruits, whole grains, and beans-are healthier than others.   Finally, stay active  Bonney Dub Huntsman, DO  10/14/2023 9:43 PM    [facility]

## 2023-11-01 ENCOUNTER — Ambulatory Visit (HOSPITAL_BASED_OUTPATIENT_CLINIC_OR_DEPARTMENT_OTHER)
Admission: RE | Admit: 2023-11-01 | Discharge: 2023-11-01 | Disposition: A | Discharge status: Home / Self Care | Source: Ambulatory Visit | Attending: Sports Medicine | Admitting: Sports Medicine

## 2023-11-01 ENCOUNTER — Ambulatory Visit (INDEPENDENT_AMBULATORY_CARE_PROVIDER_SITE_OTHER): Admitting: Sports Medicine

## 2023-11-01 ENCOUNTER — Encounter: Payer: Self-pay | Admitting: Sports Medicine

## 2023-11-01 VITALS — BP 130/76 | Ht 63.5 in | Wt 126.0 lb

## 2023-11-01 DIAGNOSIS — S92355D Nondisplaced fracture of fifth metatarsal bone, left foot, subsequent encounter for fracture with routine healing: Secondary | ICD-10-CM | POA: Diagnosis not present

## 2023-11-01 DIAGNOSIS — S92902D Unspecified fracture of left foot, subsequent encounter for fracture with routine healing: Secondary | ICD-10-CM | POA: Diagnosis not present

## 2023-11-01 NOTE — Progress Notes (Signed)
   Subjective:    Patient ID: Claudia Rasmussen, female    DOB: 1931-11-10, 89 y.o.   MRN: [id number]  HPI   Claudia Rasmussen presents today for follow-up on the transverse fracture through her left fifth metatarsal.  She is tolerating both her walking boot and postop shoe well.  Minimal discomfort.  Injury is now approximately 89 weeks old.    Review of Systems As above    Objective:   Physical Exam  Well-developed, well-nourished.  No acute distress  Left foot: No soft tissue swelling.  There is still some slight tenderness to palpation over the fracture site.  X-rays of the left foot including AP, lateral, and oblique views show a persistent fracture line across the fifth metatarsal shaft with slight progressive healing sclerosis.    Assessment & Plan:   6-week status post nondisplaced transverse left fifth metatarsal fracture  Mild healing is seen on today's x-ray.  However, fracture line is still quite apparent.  We will continue with her current treatment plan for another 4 weeks.  Follow-up in the office at the end of that time for repeat x-rays.  This note was dictated using Dragon naturally speaking software and may contain errors in syntax, spelling, or content which have not been identified prior to signing this note.

## 2023-11-07 ENCOUNTER — Other Ambulatory Visit: Payer: Self-pay | Admitting: Physician Assistant

## 2023-11-07 DIAGNOSIS — I10 Essential (primary) hypertension: Secondary | ICD-10-CM

## 2023-11-08 DIAGNOSIS — D1801 Hemangioma of skin and subcutaneous tissue: Secondary | ICD-10-CM | POA: Diagnosis not present

## 2023-11-08 DIAGNOSIS — L299 Pruritus, unspecified: Secondary | ICD-10-CM | POA: Diagnosis not present

## 2023-11-08 DIAGNOSIS — L814 Other melanin hyperpigmentation: Secondary | ICD-10-CM | POA: Diagnosis not present

## 2023-11-08 DIAGNOSIS — L821 Other seborrheic keratosis: Secondary | ICD-10-CM | POA: Diagnosis not present

## 2023-11-08 DIAGNOSIS — Z85828 Personal history of other malignant neoplasm of skin: Secondary | ICD-10-CM | POA: Diagnosis not present

## 2023-11-10 ENCOUNTER — Ambulatory Visit (INDEPENDENT_AMBULATORY_CARE_PROVIDER_SITE_OTHER)

## 2023-11-10 ENCOUNTER — Ambulatory Visit: Payer: Self-pay | Admitting: Cardiology

## 2023-11-10 DIAGNOSIS — Z8673 Personal history of transient ischemic attack (TIA), and cerebral infarction without residual deficits: Secondary | ICD-10-CM | POA: Diagnosis not present

## 2023-11-10 LAB — CUP PACEART REMOTE DEVICE CHECK
Date Time Interrogation Session: 20250916230640
Implantable Pulse Generator Implant Date: 20220708

## 2023-11-15 NOTE — Progress Notes (Signed)
 Remote Loop Recorder Transmission

## 2023-11-16 NOTE — Progress Notes (Signed)
 Remote Loop Recorder Transmission

## 2023-11-18 DIAGNOSIS — N1832 Chronic kidney disease, stage 3b: Secondary | ICD-10-CM | POA: Diagnosis not present

## 2023-11-22 DIAGNOSIS — N1832 Chronic kidney disease, stage 3b: Secondary | ICD-10-CM | POA: Diagnosis not present

## 2023-11-22 DIAGNOSIS — E875 Hyperkalemia: Secondary | ICD-10-CM | POA: Diagnosis not present

## 2023-11-22 DIAGNOSIS — I129 Hypertensive chronic kidney disease with stage 1 through stage 4 chronic kidney disease, or unspecified chronic kidney disease: Secondary | ICD-10-CM | POA: Diagnosis not present

## 2023-11-24 DIAGNOSIS — Z23 Encounter for immunization: Secondary | ICD-10-CM | POA: Diagnosis not present

## 2023-11-29 ENCOUNTER — Ambulatory Visit (INDEPENDENT_AMBULATORY_CARE_PROVIDER_SITE_OTHER)

## 2023-11-29 ENCOUNTER — Ambulatory Visit (HOSPITAL_BASED_OUTPATIENT_CLINIC_OR_DEPARTMENT_OTHER)
Admission: RE | Admit: 2023-11-29 | Discharge: 2023-11-29 | Disposition: A | Discharge status: Home / Self Care | Source: Ambulatory Visit

## 2023-11-29 VITALS — BP 118/88 | Ht 63.5 in | Wt 126.0 lb

## 2023-11-29 DIAGNOSIS — S92355D Nondisplaced fracture of fifth metatarsal bone, left foot, subsequent encounter for fracture with routine healing: Secondary | ICD-10-CM | POA: Insufficient documentation

## 2023-11-29 DIAGNOSIS — M7732 Calcaneal spur, left foot: Secondary | ICD-10-CM | POA: Diagnosis not present

## 2023-11-29 NOTE — Progress Notes (Signed)
   Subjective:    Patient ID: Raynisha Avilla, female    DOB: 89 y.o., 1931/10/29   MRN: [id number]  Chief Complaint: 10 weeks status post a nondisplaced transverse left fifth metatarsal fracture.  Patient previously evaluated by myself on 10/11/2023 then subsequently by Dr. Evalene Fillers 11/01/2023 for closed nondisplaced fracture of the proximal fifth metatarsal.  Minimal fracture healing noted on follow-up radiographs that day so immobilization regimen was extended for additional weeks.  She presents today for follow-up. She endorses compliance with fracture boot at all times with the exception of occasionally in the evenings at home removing fracture boot and replacing it with postop shoe.  She has ambulated under those conditions pain-free.  Does not report any pain at the moment.  Is not taking anything for pain at the moment.    Objective:   Physical exam Vitals:   11/29/23 0946  BP: 118/88    Right foot Mildly flaking dry skin over the foot and ankle. No overlying skin changes or bruises Tenderness to palpation along the proximal portion of the shaft of the fifth metatarsal on the dorsal aspect and lateral aspect.  Nontender on the plantar aspect. No other bony tenderness throughout the ankle or foot. 2+ DP pulse.  Cap refill less than 2 seconds distally. Intact sensation to light touch throughout the foot and ankle Full range of motion with dorsiflexion, plantarflexion, inversion, eversion.     Assessment & Plan:  Jowanna is a delightful 89 year old female presenting for 10-week follow-up of a transversely oriented fracture of the proximal third of the shaft of her fifth metatarsal.  After ambulating with postop shoe with no pain at home, I am encouraged by this that she does have bony tenderness on exam today.  We will repeat radiographs and use this information to determine if she can be liberalized to use of postop shoe only or needs additional time in boot prior to this.   Follow-up x-rays.

## 2023-11-29 NOTE — Progress Notes (Signed)
 Remote Loop Recorder Transmission

## 2023-11-30 ENCOUNTER — Ambulatory Visit: Payer: Self-pay

## 2023-11-30 DIAGNOSIS — N8110 Cystocele, unspecified: Secondary | ICD-10-CM | POA: Diagnosis not present

## 2023-11-30 DIAGNOSIS — R339 Retention of urine, unspecified: Secondary | ICD-10-CM | POA: Diagnosis not present

## 2023-11-30 DIAGNOSIS — N390 Urinary tract infection, site not specified: Secondary | ICD-10-CM | POA: Diagnosis not present

## 2023-12-05 ENCOUNTER — Ambulatory Visit (INDEPENDENT_AMBULATORY_CARE_PROVIDER_SITE_OTHER): Payer: Self-pay

## 2023-12-05 VITALS — Ht 63.5 in | Wt 126.0 lb

## 2023-12-05 DIAGNOSIS — S99102G Unspecified physeal fracture of left metatarsal, subsequent encounter for fracture with delayed healing: Secondary | ICD-10-CM

## 2023-12-05 NOTE — Progress Notes (Signed)
   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  Patient presenting today for shockwave therapy treatment of delayed/nonunion of fifth metatarsal fracture of her left foot.  She has continued immobilization in a fracture boot since her last follow-up visit with myself.  Presents for shockwave treatment #1     Objective:    Extracorporeal Shockwave Therapy Procedure Following the description of risks including pain, bruising, local skin irritation, damage to surrounding structures, patient provided verbal/written consent for ESWT procedure. Palpation was used to identify the left fifth metatarsal. Patient and probe was sterilely prepped in the usual fashion with alcohol.  Total strikes: 3500 Intensity: 10 Frequency: 10  Patient tolerated well without complication. Precautions provided.     Assessment & Plan:   Patient tolerated procedure of treatment #1 well today though the fracture site was certainly painful.  Follow-up in 1 week for treatment #2.

## 2023-12-06 ENCOUNTER — Encounter: Payer: Self-pay | Admitting: Physician Assistant

## 2023-12-06 ENCOUNTER — Ambulatory Visit (INDEPENDENT_AMBULATORY_CARE_PROVIDER_SITE_OTHER): Admitting: Physician Assistant

## 2023-12-06 VITALS — BP 136/76 | HR 65 | Temp 98.2°F | Ht 63.5 in | Wt 133.8 lb

## 2023-12-06 DIAGNOSIS — J438 Other emphysema: Secondary | ICD-10-CM

## 2023-12-06 DIAGNOSIS — Z95811 Presence of heart assist device: Secondary | ICD-10-CM | POA: Diagnosis not present

## 2023-12-06 DIAGNOSIS — S92355D Nondisplaced fracture of fifth metatarsal bone, left foot, subsequent encounter for fracture with routine healing: Secondary | ICD-10-CM | POA: Diagnosis not present

## 2023-12-06 DIAGNOSIS — I2510 Atherosclerotic heart disease of native coronary artery without angina pectoris: Secondary | ICD-10-CM

## 2023-12-06 DIAGNOSIS — N1832 Chronic kidney disease, stage 3b: Secondary | ICD-10-CM | POA: Diagnosis not present

## 2023-12-06 DIAGNOSIS — E038 Other specified hypothyroidism: Secondary | ICD-10-CM | POA: Diagnosis not present

## 2023-12-06 DIAGNOSIS — H6121 Impacted cerumen, right ear: Secondary | ICD-10-CM

## 2023-12-06 DIAGNOSIS — Z23 Encounter for immunization: Secondary | ICD-10-CM

## 2023-12-06 DIAGNOSIS — I10 Essential (primary) hypertension: Secondary | ICD-10-CM | POA: Diagnosis not present

## 2023-12-06 DIAGNOSIS — E782 Mixed hyperlipidemia: Secondary | ICD-10-CM | POA: Diagnosis not present

## 2023-12-06 DIAGNOSIS — M818 Other osteoporosis without current pathological fracture: Secondary | ICD-10-CM | POA: Diagnosis not present

## 2023-12-06 DIAGNOSIS — E559 Vitamin D deficiency, unspecified: Secondary | ICD-10-CM | POA: Diagnosis not present

## 2023-12-06 NOTE — Progress Notes (Addendum)
 Established Patient Office Visit  Subjective:  Patient ID: Claudia Rasmussen, female    DOB: 31-Jan-1932  Age: 89 y.o. MRN: [id number]  CC:  Chief Complaint  Patient presents with   Medical Management of Chronic Issues    HPI Claudia Rasmussen presents for follow up hypothyroidism and chronic medical issues  Claudia Rasmussen presents with atrophy of thyroid  (acquired).  Date of diagnosis 2002.  She is currently taking Synthroid , 88 mcg daily. Is due for labwork today    Pt presents with hyperlipidemia.  Date of diagnosis 09/2009.  Current treatment includes lipitor 40mg  qd Compliance with treatment has been good; she takes her medication as directed, maintains her low cholesterol diet, follows up as directed, and maintains her exercise regimen.  She denies experiencing any hypercholesterolemia related symptoms  Dx with age-related osteoporosis without current pathological fracture; this has been a problem for the past several years.  She describes the intensity of arthralgias as moderate.She is currently on Prolia  injections  Pt also being treated at this time for a foot fracture by ortho - it has not been healing well and she is still in a boot - is getting shock wave therapy  Pt with history of Chronic Kidney disease - is following with nephrology now on a every 6 month basis- states everything has been stable recently with most recent visit last week - had cbc, renal panel and vit D levels done which were stable  Pt with history of emphysema - currently on albuterol  as needed and symptoms stable- denies chest pain or shortness of breath At this time does not want daily inhaler  Pt with CAD and has a pacemaker - she follows with cardiology yearly She is currently on bystolic  , lasix and plavix  - she has hypertension and CAD  Pt follows with Dr Marda quarterly for recurrent UTIs - no symptoms today - she has started trimethoprim  100mg  daily  Pt would like flu shot today  Pt with right ear stopped  up - states has been 'cracking'.  Past Medical History:  Diagnosis Date   Age-related osteoporosis without current pathological fracture    Arthritis    Atrophy of thyroid  (acquired)    Benign hypertension 09/09/2020   Blepharitis 2021   Cholelithiases    Clostridium difficile infection    Diverticulitis    Diverticulitis of large intestine without perforation or abscess without bleeding    Family history of colon cancer    Migraine without aura, not intractable, without status migrainosus    Mixed hyperlipidemia    Stroke [facility])    Thyroid  disease    UTI (urinary tract infection)    Vaginal prolapse    Vitamin D  deficiency, unspecified     Past Surgical History:  Procedure Laterality Date   ABDOMINAL HYSTERECTOMY     APPENDECTOMY  1969   CATARACT EXTRACTION, BILATERAL Bilateral    right- 03/22/2018 and left 05/10/2018   COLONOSCOPY  05/12/2009   Colonic polyp status post polypectomy. Moderate predominanltly left colonic diverticulosis. Internal hemorrhoids   LEFT HEART CATH AND CORONARY ANGIOGRAPHY N/A 10/14/2020   Procedure: LEFT HEART CATH AND CORONARY ANGIOGRAPHY;  Surgeon: Dann Candyce RAMAN, MD;  Location: [facility] INVASIVE CV LAB;  Service: Cardiovascular;  Laterality: N/A;   LOOP RECORDER INSERTION N/A 08/29/2020   Procedure: LOOP RECORDER INSERTION;  Surgeon: Cindie Ole DASEN, MD;  Location: [facility] INVASIVE CV LAB;  Service: Cardiovascular;  Laterality: N/A;   MOHS SURGERY  2023   Forehead   OOPHORECTOMY Left  Family History  Problem Relation Age of Onset   Breast cancer Mother    Colon cancer Father    Prostate cancer Brother    Coronary artery disease Other    Hypertension Other    Esophageal cancer Neg Hx    Rectal cancer Neg Hx    Stomach cancer Neg Hx     Social History   Socioeconomic History   Marital status: Widowed    Spouse name: Not on file   Number of children: 3   Years of education: Not on file   Highest education level: Bachelor's degree  (e.g., BA, AB, BS)  Occupational History   Not on file  Tobacco Use   Smoking status: Former   Smokeless tobacco: Never  Vaping Use   Vaping status: Never Used  Substance and Sexual Activity   Alcohol use: Yes    Comment: Drinks alcohol very infrequently. She typically consumes white wine.   Drug use: Never   Sexual activity: Not on file  Other Topics Concern   Not on file  Social History Narrative   [facility]   Social Drivers of Health   Financial Resource Strain: Low Risk  (12/04/2023)   Overall Financial Resource Strain (CARDIA)    Difficulty of Paying Living Expenses: Not hard at all  Food Insecurity: No Food Insecurity (12/04/2023)   Hunger Vital Sign    Worried About Running Out of Food in the Last Year: Never true    Ran Out of Food in the Last Year: Never true  Transportation Needs: No Transportation Needs (12/04/2023)   PRAPARE - Administrator, Civil Service (Medical): No    Lack of Transportation (Non-Medical): No  Physical Activity: Inactive (12/04/2023)   Exercise Vital Sign    Days of Exercise per Week: 0 days    Minutes of Exercise per Session: Not on file  Stress: No Stress Concern Present (12/04/2023)   [employer] of Occupational Health - Occupational Stress Questionnaire    Feeling of Stress: Only a little  Social Connections: Moderately Integrated (12/04/2023)   Social Connection and Isolation Panel    Frequency of Communication with Friends and Family: More than three times a week    Frequency of Social Gatherings with Friends and Family: More than three times a week    Attends Religious Services: More than 4 times per year    Active Member of [employer] or Organizations: Yes    Attends Banker Meetings: More than 4 times per year    Marital Status: Widowed  Catering manager Violence: Not on file     Current Outpatient Medications:    albuterol  (PROAIR  HFA) 108 (90 Base) MCG/ACT inhaler, 2 puffs  every 4 hours as needed only  if your can't catch your breath, Disp: 59.5 g, Rfl: 2   atorvastatin  (LIPITOR) 40 MG tablet, TAKE 1 TABLET DAILY, Disp: 90 tablet, Rfl: 1   cetirizine  (ZYRTEC ) 10 MG tablet, TAKE 1 TABLET AT BEDTIME, Disp: 90 tablet, Rfl: 3   Cholecalciferol (VITAMIN D3) 25 MCG (1000 UT) CAPS, Take 1,000 Units by mouth in the morning., Disp: , Rfl:    clopidogrel  (PLAVIX ) 75 MG tablet, TAKE 1 TABLET DAILY, Disp: 90 tablet, Rfl: 3   D-MANNOSE PO, Take by mouth in the morning and at bedtime., Disp: , Rfl:    denosumab  (PROLIA ) 60 MG/ML SOSY injection, Inject 60 mg into the skin every 6 (six) months., Disp: , Rfl:    famotidine  (PEPCID )  20 MG tablet, One after supper, Disp: 90 tablet, Rfl: 2   fluticasone  (FLONASE ) 50 MCG/ACT nasal spray, USE 2 SPRAYS IN EACH NOSTRIL DAILY, Disp: 48 g, Rfl: 3   furosemide (LASIX) 20 MG tablet, Take by mouth., Disp: , Rfl:    levothyroxine  (SYNTHROID ) 88 MCG tablet, Take 1 tablet (88 mcg total) by mouth daily., Disp: 90 tablet, Rfl: 0   MAGNESIUM PO, Take by mouth 3 (three) times daily. Pt says she takes 1 cap in morning and 2 caps at night, Disp: , Rfl:    Melatonin-Pyridoxine (MELATIN PO), Take by mouth., Disp: , Rfl:    nebivolol  (BYSTOLIC ) 2.5 MG tablet, TAKE 1 TABLET(2.5 MG) BY MOUTH DAILY, Disp: 90 tablet, Rfl: 1   nitroGLYCERIN  (NITROSTAT ) 0.4 MG SL tablet, Place 1 tablet (0.4 mg total) under the tongue every 5 (five) minutes as needed for chest pain., Disp: 25 tablet, Rfl: 3   Polyvinyl Alcohol-Povidone (REFRESH OP), Place 1 drop into both eyes in the morning and at bedtime., Disp: , Rfl:    Probiotic Product (PROBIOTIC-10 PO), Take 1 capsule by mouth daily., Disp: , Rfl:    sulfamethoxazole -trimethoprim  (BACTRIM ) 400-80 MG tablet, Take 1 tablet by mouth 2 (two) times daily. 2 days left, Disp: , Rfl:    trimethoprim  (TRIMPEX ) 100 MG tablet, Take 100 mg by mouth daily., Disp: , Rfl:    pantoprazole  (PROTONIX ) 40 MG tablet, Take 30-60 min before  first meal of the day (Patient not taking: Reported on 12/06/2023), Disp: 90 tablet, Rfl: 2  Current Facility-Administered Medications:    [START ON 03/19/2024] denosumab  (PROLIA ) injection 60 mg, 60 mg, Subcutaneous, Once, [name], [name]   Allergies  Allergen Reactions   Shellfish Allergy Other (See Comments)    Itching, Hives Itching, Hives   Nitrofuran Derivatives Other (See Comments)    Made her not feel good, caused tremors, increased creatnine   Azithromycin Rash   Cephalosporins Other (See Comments) and Dermatitis    Itching  Itching   Itching    Itching     Itching   Levofloxacin Dermatitis   Moxifloxacin Hcl Rash   Phenobarbital Rash   CONSTITUTIONAL: Negative for chills, fatigue, fever, unintentional weight gain and unintentional weight loss.  E/N/T: see HPI CARDIOVASCULAR: Negative for chest pain, dizziness, palpitations and pedal edema.  RESPIRATORY: Negative for recent cough and dyspnea.  GASTROINTESTINAL: Negative for abdominal pain, acid reflux symptoms, constipation, diarrhea, nausea and vomiting.  MSK: see HPI INTEGUMENTARY: Negative for rash.  NEUROLOGICAL: Negative for dizziness and headaches.  PSYCHIATRIC: Negative for sleep disturbance and to question depression screen.  Negative for depression, negative for anhedonia.        Objective:  PHYSICAL EXAM:   VS: BP 136/76   Pulse 65   Temp 98.2 F (36.8 C)   Ht 5' 3.5 (1.613 m)   Wt 133 lb 12.8 oz (60.7 kg)   BMI 23.33 kg/m   GEN: Well nourished, well developed, in no acute distress  HEENT: left TM and canal normal - right canal with wax noted - cleared with irrigation - TM normal - Lips, Teeth and Gums - normal  Oropharynx - normal mucosa, palate, and posterior pharynx Cardiac: RRR; no murmurs, rubs, or gallops,no edema - Respiratory:  normal respiratory rate and pattern with no distress - normal breath sounds with no rales, rhonchi, wheezes or rubs MS: no deformity or atrophy - left foot in  boot Skin: warm and dry, no rash  Neuro:  Alert and Oriented x 3,  -  CN II-Xii grossly intact Psych: euthymic mood, appropriate affect and demeanor    No visits with results within 1 Day(s) from this visit.  Latest known visit with results is:  Clinical Support on 11/10/2023  Component Date Value Ref Range Status   Date Time Interrogation Session 11/08/2023 [id number]   Final   Pulse Generator Manufacturer 11/08/2023 MERM   Final   Pulse Gen Model 11/08/2023 [id number] II   Final   Pulse Gen Serial Number 11/08/2023 [id number]   Final   Clinic Name 11/08/2023 [facility]   Final   Implantable Pulse Generator Type 11/08/2023 ICM/ILR   Final   Implantable Pulse Generator Implan* 11/08/2023 [id number]   Final    Health Maintenance Due  Topic Date Due   Medicare Annual Wellness (AWV)  12/27/2023     There are no preventive care reminders to display for this patient.  Lab Results  Component Value Date   TSH 6.280 (H) 09/21/2023   Lab Results  Component Value Date   WBC 5.7 08/30/2023   HGB 13.9 08/30/2023   HCT 42.2 08/30/2023   MCV 94.0 08/30/2023   PLT 173.0 08/30/2023   Lab Results  Component Value Date   NA 142 05/31/2023   K 5.0 05/31/2023   CO2 20 05/31/2023   GLUCOSE 95 05/31/2023   BUN 31 (H) 05/31/2023   CREATININE 1.36 (H) 05/31/2023   BILITOT 0.7 05/31/2023   ALKPHOS 93 05/31/2023   AST 29 05/31/2023   ALT 26 05/31/2023   PROT 6.7 05/31/2023   ALBUMIN 4.0 05/31/2023   CALCIUM  10.0 05/31/2023   ANIONGAP 9 10/06/2021   EGFR 37 (L) 05/31/2023   Lab Results  Component Value Date   CHOL 160 05/31/2023   Lab Results  Component Value Date   HDL 70 05/31/2023   Lab Results  Component Value Date   LDLCALC 78 05/31/2023   Lab Results  Component Value Date   TRIG 62 05/31/2023   Lab Results  Component Value Date   CHOLHDL 2.3 05/31/2023   Lab Results  Component Value Date   HGBA1C 5.4 08/28/2020      Assessment & Plan:   Problem  List Items Addressed This Visit       Endocrine   Other specified hypothyroidism - Primary   Relevant Orders   TSH Continue current meds     Genitourinary   Stage 3b chronic kidney disease (HCC) Continue follow up with nephrology     Other   Mixed hyperlipidemia   Relevant Orders   Lipid panel Continue meds   Vit D def Recent lab normal Continue supplement  Emphysema Continue advair  Hx CAD and pacemaker Follow up with cardiology as directed      Osteoporosis Continue prolia   Cerumen impaction Water irrigation done and cleared     Closed nondisplaced fracture 5th metatarsal Follow up with ortho as directed  Hypertension Continue coreg  Presence of heart assist device Follow up with cardiology as directed    Need for flu vaccine Fluzone Hi dose given             No orders of the defined types were placed in this encounter.    Follow-up: Return in about 6 months (around 06/05/2024) for chronic fasting follow-up.    SARA R Nasim Habeeb, [name]

## 2023-12-07 ENCOUNTER — Ambulatory Visit: Payer: Self-pay | Admitting: Physician Assistant

## 2023-12-07 LAB — LIPID PANEL
Chol/HDL Ratio: 2.1 ratio (ref 0.0–4.4)
Cholesterol, Total: 152 mg/dL (ref 100–199)
HDL: 72 mg/dL (ref >39)
LDL Chol Calc (NIH): 65 mg/dL (ref 0–99)
Triglycerides: 78 mg/dL (ref 0–149)
VLDL Cholesterol Cal: 15 mg/dL (ref 5–40)

## 2023-12-07 LAB — COMPREHENSIVE METABOLIC PANEL WITH GFR
ALT: 27 IU/L (ref 0–32)
AST: 26 IU/L (ref 0–40)
Albumin: 4.3 g/dL (ref 3.6–4.6)
Alkaline Phosphatase: 94 IU/L (ref 48–129)
BUN/Creatinine Ratio: 22 (ref 12–28)
BUN: 33 mg/dL (ref 10–36)
Bilirubin Total: 0.9 mg/dL (ref 0.0–1.2)
CO2: 18 mmol/L — ABNORMAL LOW (ref 20–29)
Calcium: 9.5 mg/dL (ref 8.7–10.3)
Chloride: 107 mmol/L — ABNORMAL HIGH (ref 96–106)
Creatinine, Ser: 1.49 mg/dL — ABNORMAL HIGH (ref 0.57–1.00)
Globulin, Total: 2.5 g/dL (ref 1.5–4.5)
Glucose: 79 mg/dL (ref 70–99)
Potassium: 5.1 mmol/L (ref 3.5–5.2)
Sodium: 139 mmol/L (ref 134–144)
Total Protein: 6.8 g/dL (ref 6.0–8.5)
eGFR: 33 mL/min/1.73 — ABNORMAL LOW (ref >59)

## 2023-12-07 LAB — TSH: TSH: 3.71 u[IU]/mL (ref 0.450–4.500)

## 2023-12-12 ENCOUNTER — Other Ambulatory Visit: Payer: Self-pay | Admitting: Physician Assistant

## 2023-12-12 ENCOUNTER — Ambulatory Visit: Attending: Cardiology

## 2023-12-12 ENCOUNTER — Encounter

## 2023-12-12 DIAGNOSIS — Z8673 Personal history of transient ischemic attack (TIA), and cerebral infarction without residual deficits: Secondary | ICD-10-CM

## 2023-12-13 ENCOUNTER — Ambulatory Visit (INDEPENDENT_AMBULATORY_CARE_PROVIDER_SITE_OTHER): Payer: Self-pay

## 2023-12-13 DIAGNOSIS — S99102G Unspecified physeal fracture of left metatarsal, subsequent encounter for fracture with delayed healing: Secondary | ICD-10-CM

## 2023-12-13 LAB — CUP PACEART REMOTE DEVICE CHECK
Date Time Interrogation Session: 20251019231017
Implantable Pulse Generator Implant Date: 20220708

## 2023-12-13 NOTE — Progress Notes (Signed)
   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   Patient presenting today for shockwave therapy treatment of delayed/nonunion of fifth metatarsal fracture of her left foot.  She has continued immobilization in a fracture boot with rare instances of walking barefoot.  She has remained pain-free during that entire time.  She did not have any pain after last treatment 1 week ago.   Presents for shockwave treatment #2      Objective:      Extracorporeal Shockwave Therapy Procedure Following the description of risks including pain, bruising, local skin irritation, damage to surrounding structures, patient provided verbal/written consent for ESWT procedure. Palpation was used to identify the left fifth metatarsal. Patient and probe was sterilely prepped in the usual fashion with alcohol.   Total strikes: 3500 Intensity: 10 Frequency: 10   Patient tolerated well without complication. Precautions provided.      Assessment & Plan:    Patient tolerated procedure of treatment #2 well today though the fracture site was certainly painful.  Follow-up in 1 week for treatment #3.  Plan to reimage after treatment #4

## 2023-12-16 NOTE — Progress Notes (Signed)
 Remote Loop Recorder Transmission

## 2023-12-17 ENCOUNTER — Other Ambulatory Visit: Payer: Self-pay | Admitting: Physician Assistant

## 2023-12-17 DIAGNOSIS — E038 Other specified hypothyroidism: Secondary | ICD-10-CM

## 2023-12-19 ENCOUNTER — Ambulatory Visit: Payer: Self-pay | Admitting: Cardiology

## 2023-12-19 ENCOUNTER — Ambulatory Visit (INDEPENDENT_AMBULATORY_CARE_PROVIDER_SITE_OTHER): Payer: Self-pay

## 2023-12-19 VITALS — Ht 63.5 in | Wt 126.0 lb

## 2023-12-19 DIAGNOSIS — S99102G Unspecified physeal fracture of left metatarsal, subsequent encounter for fracture with delayed healing: Secondary | ICD-10-CM

## 2023-12-19 NOTE — Progress Notes (Signed)
   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   Patient presenting today for shockwave therapy treatment of delayed/nonunion of fifth metatarsal fracture of her left foot.  She has continued immobilization in a fracture boot with rare instances of walking barefoot.  She has remained pain-free during that entire time.  She has not had pain after any treatments thus far.   Presents for shockwave treatment #3      Objective:      Extracorporeal Shockwave Therapy Procedure Following the description of risks including pain, bruising, local skin irritation, damage to surrounding structures, patient provided verbal/written consent for ESWT procedure. Palpation was used to identify the left fifth metatarsal. Patient and probe was sterilely prepped in the usual fashion with alcohol.   Total strikes: 3500 Intensity: 10 Frequency: 12   Patient tolerated well without complication. Precautions provided.      Assessment & Plan:    Patient tolerated procedure of treatment #3 well today though the fracture site was certainly painful.  Follow-up in 1 week for treatment #4.  Plan to reimage next week as well as treatment #4

## 2023-12-20 ENCOUNTER — Telehealth: Payer: Self-pay | Admitting: Physician Assistant

## 2023-12-20 NOTE — Telephone Encounter (Signed)
 Called and left a voicemail as well as left a MyChart message for patient to call the office to reschedule the appointment for 06/07/24 due to the provider being out of the office.

## 2023-12-26 ENCOUNTER — Other Ambulatory Visit: Payer: Self-pay

## 2023-12-26 ENCOUNTER — Ambulatory Visit (HOSPITAL_BASED_OUTPATIENT_CLINIC_OR_DEPARTMENT_OTHER)
Admission: RE | Admit: 2023-12-26 | Discharge: 2023-12-26 | Disposition: A | Discharge status: Home / Self Care | Source: Ambulatory Visit

## 2023-12-26 ENCOUNTER — Ambulatory Visit: Payer: Self-pay

## 2023-12-26 VITALS — Ht 63.5 in | Wt 126.0 lb

## 2023-12-26 DIAGNOSIS — M19072 Primary osteoarthritis, left ankle and foot: Secondary | ICD-10-CM | POA: Diagnosis not present

## 2023-12-26 DIAGNOSIS — S99102G Unspecified physeal fracture of left metatarsal, subsequent encounter for fracture with delayed healing: Secondary | ICD-10-CM | POA: Diagnosis not present

## 2023-12-26 DIAGNOSIS — M7732 Calcaneal spur, left foot: Secondary | ICD-10-CM | POA: Diagnosis not present

## 2023-12-26 DIAGNOSIS — S92355A Nondisplaced fracture of fifth metatarsal bone, left foot, initial encounter for closed fracture: Secondary | ICD-10-CM | POA: Diagnosis not present

## 2023-12-26 NOTE — Progress Notes (Signed)
   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   Patient presenting today for shockwave therapy treatment of delayed/nonunion of fifth metatarsal fracture of her left foot.  She has continued immobilization in a fracture boot with rare instances of walking barefoot (all of which has been pain-free).  Presents for shockwave treatment #4   12/26/2023 plain film radiographs obtained in the left foot per my independent review revealing persistent fracture line in shaft of fifth metatarsal but with noted interval increased bony callus formation.    Objective:      Extracorporeal Shockwave Therapy Procedure Following the description of risks including pain, bruising, local skin irritation, damage to surrounding structures, patient provided verbal/written consent for ESWT procedure. Palpation was used to identify the left fifth metatarsal. Patient and probe was sterilely prepped in the usual fashion with alcohol.   Total strikes: 3500 Intensity: 10 Frequency: 12   Patient tolerated well without complication. Precautions provided.      Assessment & Plan:    Patient tolerated procedure of treatment #4 well today though the fracture site was certainly painful.  Follow-up in 1 week for treatment #5.  Plan to reimage after treatment #7.

## 2024-01-02 ENCOUNTER — Ambulatory Visit (INDEPENDENT_AMBULATORY_CARE_PROVIDER_SITE_OTHER): Payer: Self-pay

## 2024-01-02 DIAGNOSIS — S99102G Unspecified physeal fracture of left metatarsal, subsequent encounter for fracture with delayed healing: Secondary | ICD-10-CM

## 2024-01-02 NOTE — Progress Notes (Signed)
   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   Patient presenting today for shockwave therapy treatment of delayed/nonunion of fifth metatarsal fracture of her left foot.  She has continued immobilization in a fracture boot with rare instances of walking barefoot (all of which has been pain-free).  Presents for shockwave treatment #5      Objective:    Left foot: Proximal fifth metatarsal shaft does have tenderness to palpation.   Extracorporeal Shockwave Therapy Procedure Following the description of risks including pain, bruising, local skin irritation, damage to surrounding structures, patient provided verbal/written consent for ESWT procedure. Palpation was used to identify the left fifth metatarsal. Patient and probe was sterilely prepped in the usual fashion with alcohol.   Total strikes: 3500 Intensity: 10 Frequency: 12   Patient tolerated well without complication. Precautions provided.      Assessment & Plan:    Patient tolerated procedure of treatment #5 well today though the fracture site was certainly painful. Difficult to tell if this is improved from prior or not.  Follow-up in 1 week for treatment #6.  Plan to reimage after treatment #7.

## 2024-01-09 ENCOUNTER — Ambulatory Visit (HOSPITAL_BASED_OUTPATIENT_CLINIC_OR_DEPARTMENT_OTHER)
Admission: RE | Admit: 2024-01-09 | Discharge: 2024-01-09 | Disposition: A | Discharge status: Home / Self Care | Source: Ambulatory Visit

## 2024-01-09 ENCOUNTER — Ambulatory Visit (INDEPENDENT_AMBULATORY_CARE_PROVIDER_SITE_OTHER): Payer: Self-pay

## 2024-01-09 VITALS — Ht 63.5 in | Wt 126.0 lb

## 2024-01-09 DIAGNOSIS — S99102G Unspecified physeal fracture of left metatarsal, subsequent encounter for fracture with delayed healing: Secondary | ICD-10-CM | POA: Diagnosis not present

## 2024-01-09 DIAGNOSIS — M19072 Primary osteoarthritis, left ankle and foot: Secondary | ICD-10-CM | POA: Diagnosis not present

## 2024-01-09 DIAGNOSIS — S92355D Nondisplaced fracture of fifth metatarsal bone, left foot, subsequent encounter for fracture with routine healing: Secondary | ICD-10-CM | POA: Diagnosis not present

## 2024-01-09 NOTE — Progress Notes (Signed)
   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   Patient presenting today for shockwave therapy treatment of delayed/nonunion of fifth metatarsal fracture of her left foot.  She has continued immobilization in a fracture boot with rare instances of walking barefoot (all of which continues to be pain-free).  Presents for shockwave treatment #6      Objective:    Left foot: Proximal fifth metatarsal shaft does have tenderness to palpation.   Extracorporeal Shockwave Therapy Procedure Following the description of risks including pain, bruising, local skin irritation, damage to surrounding structures, patient provided verbal/written consent for ESWT procedure. Palpation was used to identify the left fifth metatarsal. Patient and probe was sterilely prepped in the usual fashion with alcohol.   Total strikes: 3500 Intensity: 10 Frequency: 12   Patient tolerated well without complication. Precautions provided.      Assessment & Plan:    Patient tolerated procedure of treatment #6 well today though the fracture site was certainly painful. Difficult to tell if this is improved from prior or not.  Follow-up in 1 week for treatment #6. Repeat imaging obtained today revealing continued demonstration of fracture but appropriate interval healing. Follow up for repeat treatment in 1 week.

## 2024-01-10 ENCOUNTER — Ambulatory Visit: Payer: Self-pay

## 2024-01-12 ENCOUNTER — Ambulatory Visit: Attending: Cardiology

## 2024-01-12 ENCOUNTER — Encounter

## 2024-01-12 DIAGNOSIS — I6300 Cerebral infarction due to thrombosis of unspecified precerebral artery: Secondary | ICD-10-CM | POA: Diagnosis not present

## 2024-01-12 LAB — CUP PACEART REMOTE DEVICE CHECK
Date Time Interrogation Session: 20251119230949
Implantable Pulse Generator Implant Date: 20220708

## 2024-01-15 ENCOUNTER — Ambulatory Visit: Payer: Self-pay | Admitting: Cardiology

## 2024-01-16 ENCOUNTER — Ambulatory Visit: Payer: Self-pay

## 2024-01-16 DIAGNOSIS — S99102G Unspecified physeal fracture of left metatarsal, subsequent encounter for fracture with delayed healing: Secondary | ICD-10-CM

## 2024-01-16 NOTE — Progress Notes (Signed)
 Remote Loop Recorder Transmission

## 2024-01-16 NOTE — Progress Notes (Signed)
   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   Patient presenting today for shockwave therapy treatment of delayed/nonunion of fifth metatarsal fracture of her left foot.  She has continued immobilization in a fracture boot with rare instances of walking barefoot (all of which continues to be pain-free).  Presents for shockwave treatment #7      Objective:    Left foot: Proximal fifth metatarsal shaft does have tenderness to palpation.   Extracorporeal Shockwave Therapy Procedure Following the description of risks including pain, bruising, local skin irritation, damage to surrounding structures, patient provided verbal/written consent for ESWT procedure. Palpation was used to identify the left fifth metatarsal. Patient and probe was sterilely prepped in the usual fashion with alcohol.   Total strikes: 3500 Intensity: 10 Frequency: 12   Patient tolerated well without complication. Precautions provided.      Assessment & Plan:    Patient tolerated procedure of treatment #7 well today though the fracture site was certainly painful. Difficult to tell if this is improved from prior or not.  Follow-up in 1 week for treatment #8. Follow up for repeat treatment in 1 week.  Plan to obtain repeat x-ray after treatment #9-10

## 2024-01-22 NOTE — Progress Notes (Unsigned)
   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   Patient presenting today for shockwave therapy treatment of delayed/nonunion of fifth metatarsal fracture of her left foot.  She has continued immobilization in a fracture boot with rare instances of walking barefoot (all of which continues to be pain-free).  Currently receiving Prolia  for treatment of her osteoporosis.  Taking vitamin D , but not taking calcium . Does take at least about 1 cup of cottage cheese in per day. Last BMD: 07/30/2021 - Lowest T score in Left femur (-3.3)  Presents for shockwave treatment #8      Objective:    Left foot: Proximal fifth metatarsal shaft does have tenderness to palpation.   Extracorporeal Shockwave Therapy Procedure Following the description of risks including pain, bruising, local skin irritation, damage to surrounding structures, patient provided verbal/written consent for ESWT procedure. Palpation was used to identify the left fifth metatarsal. Patient and probe was sterilely prepped in the usual fashion with alcohol.   Total strikes: 3500 Intensity: 10 Frequency: 12   Patient tolerated well without complication. Precautions provided.      Assessment & Plan:    Patient tolerated procedure of treatment #8 well today though the fracture site was certainly painful. Difficult to tell if this is improved from prior or not.  Follow-up in 1 week for treatment #9. Follow up for repeat treatment in 1 week. Regarding her osteoporosis, I recommend that she initiate use of calcium  supplementation and I provided written guidelines for the vitamin D  and calcium  supplementation recommendations for her.  Will also likely obtain repeat DEXA once we have attained bony healing in her foot.  Plan to obtain repeat x-ray after treatment #9-10

## 2024-01-23 ENCOUNTER — Ambulatory Visit

## 2024-01-23 DIAGNOSIS — S99102G Unspecified physeal fracture of left metatarsal, subsequent encounter for fracture with delayed healing: Secondary | ICD-10-CM

## 2024-01-23 DIAGNOSIS — M810 Age-related osteoporosis without current pathological fracture: Secondary | ICD-10-CM

## 2024-01-30 ENCOUNTER — Ambulatory Visit: Payer: Self-pay

## 2024-01-30 DIAGNOSIS — S99102G Unspecified physeal fracture of left metatarsal, subsequent encounter for fracture with delayed healing: Secondary | ICD-10-CM

## 2024-01-30 DIAGNOSIS — M810 Age-related osteoporosis without current pathological fracture: Secondary | ICD-10-CM

## 2024-01-30 NOTE — Progress Notes (Signed)
   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   Patient presenting today for shockwave therapy treatment of delayed/nonunion of fifth metatarsal fracture of her left foot.  She has continued immobilization in a fracture boot with rare instances of walking barefoot (all of which continues to be pain-free).  Currently receiving Prolia  for treatment of her osteoporosis.  Taking vitamin D  1000 IU/day, started taking calcium  last week 1200 mg/day Last BMD: 07/30/2021 - Lowest T score in Left femur (-3.3)  Presents for shockwave treatment #9      Objective:    Left foot: Proximal fifth metatarsal shaft does have tenderness to palpation.   Extracorporeal Shockwave Therapy Procedure Following the description of risks including pain, bruising, local skin irritation, damage to surrounding structures, patient provided verbal/written consent for ESWT procedure. Palpation was used to identify the left fifth metatarsal. Patient and probe was sterilely prepped in the usual fashion with alcohol.   Total strikes: 3500 Intensity: 10 Frequency: 12   Patient tolerated well without complication. Precautions provided.      Assessment & Plan:    Patient tolerated procedure of treatment #9 well today though the fracture site was certainly painful. Difficult to tell if this is improved from prior or not.  Follow-up in 1 week for treatment #10. Follow up for repeat treatment in 1 week. Regarding her osteoporosis, I recommend that she initiate use of calcium  supplementation and I provided written guidelines for the vitamin D  and calcium  supplementation recommendations for her.  Will also likely obtain repeat DEXA once we have attained bony healing in her foot.  Plan to obtain repeat x-ray same day as treatment #10

## 2024-02-06 ENCOUNTER — Ambulatory Visit (HOSPITAL_BASED_OUTPATIENT_CLINIC_OR_DEPARTMENT_OTHER)
Admission: RE | Admit: 2024-02-06 | Discharge: 2024-02-06 | Disposition: A | Discharge status: Home / Self Care | Source: Ambulatory Visit

## 2024-02-06 ENCOUNTER — Ambulatory Visit: Payer: Self-pay

## 2024-02-06 DIAGNOSIS — M810 Age-related osteoporosis without current pathological fracture: Secondary | ICD-10-CM

## 2024-02-06 DIAGNOSIS — S99102G Unspecified physeal fracture of left metatarsal, subsequent encounter for fracture with delayed healing: Secondary | ICD-10-CM

## 2024-02-06 NOTE — Progress Notes (Signed)
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   Patient presenting today for shockwave therapy treatment of delayed/nonunion of fifth metatarsal fracture of her left foot.  She has continued immobilization in a fracture boot with rare instances of walking barefoot (all of which continues to be pain-free).  Currently receiving Prolia  for treatment of her osteoporosis.  Taking vitamin D  1000 IU/day, started taking calcium  last week 1200 mg/day Last BMD: 07/30/2021 - Lowest T score in Left femur (-3.3)  Presents for shockwave treatment #10      Objective:    Left foot: Proximal fifth metatarsal shaft with minimal TTP.   Extracorporeal Shockwave Therapy Procedure Following the description of risks including pain, bruising, local skin irritation, damage to surrounding structures, patient provided verbal/written consent for ESWT procedure. Palpation was used to identify the left fifth metatarsal. Patient and probe was sterilely prepped in the usual fashion with alcohol.   Total strikes: 3500 Intensity: 20 Frequency: 12   Patient tolerated well without complication. Precautions provided.      Assessment & Plan:    Patient tolerated procedure of treatment #10 well today. Fracture site notably less painful with palpation and shockwave. 3 view plain film radiographs obtained of the left foot today per my independent review revealing excellent interval bony callus formation with osseous material filling in fracture gap well. Fracture still visible but significantly improved. Transition to post-op shoe.    Regarding her osteoporosis... Continue Calcium  1200mg , Vit D 1000IU daily Obtain repeat DEXA once bony healing of fracture obtained.

## 2024-02-12 ENCOUNTER — Ambulatory Visit

## 2024-02-12 DIAGNOSIS — Z8673 Personal history of transient ischemic attack (TIA), and cerebral infarction without residual deficits: Secondary | ICD-10-CM

## 2024-02-13 ENCOUNTER — Encounter

## 2024-02-13 ENCOUNTER — Ambulatory Visit (INDEPENDENT_AMBULATORY_CARE_PROVIDER_SITE_OTHER): Payer: Self-pay

## 2024-02-13 DIAGNOSIS — S99102G Unspecified physeal fracture of left metatarsal, subsequent encounter for fracture with delayed healing: Secondary | ICD-10-CM

## 2024-02-13 DIAGNOSIS — M8000XG Age-related osteoporosis with current pathological fracture, unspecified site, subsequent encounter for fracture with delayed healing: Secondary | ICD-10-CM

## 2024-02-13 LAB — CUP PACEART REMOTE DEVICE CHECK
Date Time Interrogation Session: 20251220230516
Implantable Pulse Generator Implant Date: 20220708

## 2024-02-13 NOTE — Progress Notes (Signed)
 Remote Loop Recorder Transmission

## 2024-02-13 NOTE — Progress Notes (Signed)
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   Patient presenting today for shockwave therapy treatment of delayed/nonunion of fifth metatarsal fracture of her left foot.  She has been ambulating with post op shoe and intermittently barefoot (all of which has been pain-free).  Currently receiving Prolia  for treatment of her osteoporosis.  Taking vitamin D  1000 IU/day, started taking calcium  last week 1200 mg/day Last BMD: 07/30/2021 - Lowest T score in Left femur (-3.3) Was on fosamax for years. Then prolia  more recently. No other osteoporosis meds.  Presents for shockwave treatment #11      Objective:    Left foot: Proximal fifth metatarsal shaft with minimal TTP.   Extracorporeal Shockwave Therapy Procedure Following the description of risks including pain, bruising, local skin irritation, damage to surrounding structures, patient provided verbal/written consent for ESWT procedure. Palpation was used to identify the left fifth metatarsal. Patient and probe was sterilely prepped in the usual fashion with alcohol.   Total strikes: 3500 Intensity: 20 Frequency: 12   Patient tolerated well without complication. Precautions provided.      Assessment & Plan:    Patient tolerated procedure of treatment #11 well today. Fracture site notably less painful with palpation and shockwave. Doing well after transition to post-op shoe.   Regarding her osteoporosis... Continue Calcium  1200mg , Vit D 1000IU daily Obtain repeat DEXA once bony healing of fracture obtained. Will plan for evenity for 1 yr then prolia  thereafter.   "

## 2024-02-17 ENCOUNTER — Ambulatory Visit: Payer: Self-pay | Admitting: Cardiology

## 2024-02-20 ENCOUNTER — Ambulatory Visit (INDEPENDENT_AMBULATORY_CARE_PROVIDER_SITE_OTHER): Payer: Self-pay

## 2024-02-20 DIAGNOSIS — M8000XG Age-related osteoporosis with current pathological fracture, unspecified site, subsequent encounter for fracture with delayed healing: Secondary | ICD-10-CM

## 2024-02-20 DIAGNOSIS — S99102G Unspecified physeal fracture of left metatarsal, subsequent encounter for fracture with delayed healing: Secondary | ICD-10-CM

## 2024-02-20 NOTE — Progress Notes (Signed)
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   Patient presenting today for shockwave therapy treatment of delayed/nonunion of fifth metatarsal fracture of her left foot.  She has been ambulating with post op shoe and intermittently barefoot (all of which has been pain-free).  Currently receiving Prolia  for treatment of her osteoporosis.  Taking vitamin D  1000 IU/day, started taking calcium  last week 1200 mg/day Last BMD: 07/30/2021 - Lowest T score in Left femur (-3.3) Was on fosamax for years. Then prolia  more recently. No other osteoporosis meds.  Presents for shockwave treatment #12      Objective:    Left foot: Proximal fifth metatarsal shaft with minimal TTP.   Extracorporeal Shockwave Therapy Procedure Following the description of risks including pain, bruising, local skin irritation, damage to surrounding structures, patient provided verbal/written consent for ESWT procedure. Palpation was used to identify the left fifth metatarsal. Patient and probe was sterilely prepped in the usual fashion with alcohol.   Total strikes: 3500 Intensity: 20 Frequency: 12   Patient tolerated well without complication. Precautions provided.      Assessment & Plan:    Patient tolerated procedure of treatment #12 well today. Fracture site continues to be notably less painful with palpation and shockwave. Plan to re-image after next session.   Regarding her osteoporosis... Continue Calcium  1200mg , Vit D 1000IU daily Obtain repeat DEXA once bony healing of fracture obtained. Will plan for evenity for 1 yr then prolia  thereafter.   "

## 2024-02-22 ENCOUNTER — Encounter: Payer: Self-pay | Admitting: Physician Assistant

## 2024-02-27 ENCOUNTER — Other Ambulatory Visit: Payer: Self-pay | Admitting: Family Medicine

## 2024-02-27 ENCOUNTER — Ambulatory Visit (HOSPITAL_BASED_OUTPATIENT_CLINIC_OR_DEPARTMENT_OTHER)
Admission: RE | Admit: 2024-02-27 | Discharge: 2024-02-27 | Disposition: A | Discharge status: Home / Self Care | Source: Ambulatory Visit

## 2024-02-27 ENCOUNTER — Telehealth: Payer: Self-pay

## 2024-02-27 ENCOUNTER — Ambulatory Visit (INDEPENDENT_AMBULATORY_CARE_PROVIDER_SITE_OTHER): Payer: Self-pay

## 2024-02-27 DIAGNOSIS — S99102G Unspecified physeal fracture of left metatarsal, subsequent encounter for fracture with delayed healing: Secondary | ICD-10-CM | POA: Insufficient documentation

## 2024-02-27 DIAGNOSIS — M8000XG Age-related osteoporosis with current pathological fracture, unspecified site, subsequent encounter for fracture with delayed healing: Secondary | ICD-10-CM

## 2024-02-27 NOTE — Progress Notes (Signed)
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   Patient presenting today for shockwave therapy treatment of delayed/nonunion of fifth metatarsal fracture of her left foot.  She has been ambulating with post op shoe and did note that this past Saturday she experienced some increased pain after being on her feet most of the day on Friday and Saturday.  Currently receiving Prolia  for treatment of her osteoporosis.  Taking vitamin D  1000 IU/day, started taking calcium  last week 1200 mg/day Last BMD: 07/30/2021 - Lowest T score in Left femur (-3.3) Was on fosamax for years. Then prolia  more recently. No other osteoporosis meds.   Three-view plain from radiographs obtained of the left foot today per my independent evaluation and review revealing decreased interval bony callus visible in fracture space particularly on the lateral side.    Objective:    Left foot: Proximal fifth metatarsal shaft with minimal TTP.   Extracorporeal Shockwave Therapy Procedure Following the description of risks including pain, bruising, local skin irritation, damage to surrounding structures, patient provided verbal/written consent for ESWT procedure. Palpation was used to identify the left fifth metatarsal. Patient and probe was sterilely prepped in the usual fashion with alcohol.   Total strikes: 3500 Intensity: 20 Frequency: 12   Patient tolerated well without complication. Precautions provided.      Assessment & Plan:    Given the worsening of Claudia Rasmussen's fracture today, I am putting her back in the cam walker boot, initiating Evenity, and following up in 1 month.  Have instructed her to pause Prolia  as her next injections and this is not scheduled until later this month.  Regarding her osteoporosis... Continue Calcium  1200mg , Vit D 1000IU daily Obtain repeat DEXA once  bony healing of fracture obtained. Will plan for evenity for 1 yr then prolia  thereafter.   "

## 2024-02-27 NOTE — Telephone Encounter (Signed)
 Dr. Sherre, patient will be scheduled as soon as possible.  Auth Submission: NO AUTH NEEDED Site of care: Site of care: [facility] Payer: Medicare A/B Medication & CPT/J Code(s) submitted: Prolia  (Denosumab ) [id number] Diagnosis Code:  Route of submission (phone, fax, portal):  Phone # Fax # Auth type: Buy/Bill PB Units/visits requested: 60mg  x 2 doses Reference number:  Approval from: 02/27/24 to 03/24/25

## 2024-02-28 ENCOUNTER — Telehealth: Payer: Self-pay

## 2024-02-28 NOTE — Telephone Encounter (Signed)
 Copied from CRM [id number]. Topic: General - Other >> Feb 27, 2024  2:08 PM Antony RAMAN wrote: Reason for CRM: patient called to tell the doctor she's going to stop taking prolia  shot, and going to start taking evenity shot with brian gottwalt

## 2024-02-29 ENCOUNTER — Other Ambulatory Visit: Payer: Self-pay

## 2024-03-01 ENCOUNTER — Other Ambulatory Visit: Payer: Self-pay

## 2024-03-01 ENCOUNTER — Other Ambulatory Visit: Payer: Self-pay | Admitting: *Deleted

## 2024-03-01 ENCOUNTER — Telehealth: Payer: Self-pay | Admitting: *Deleted

## 2024-03-01 MED ORDER — TYMLOS 3120 MCG/1.56ML ~~LOC~~ SOPN: 80.0000 ug | PEN_INJECTOR | Freq: Every day | SUBCUTANEOUS | 12 refills | Status: DC

## 2024-03-01 NOTE — Telephone Encounter (Signed)
 Tymlos  rx sent to [facility]. Tymlos  requires PA and Forteo is preferred.

## 2024-03-01 NOTE — Telephone Encounter (Signed)
-----   Message from Redell Robes, DO sent at 03/01/2024  7:49 AM EST ----- Regarding: Claudia Rasmussen [name]! Can you please run [name]'s insurance to see if it will cover Claudia Rasmussen?  She's got a history of stroke and won't qualify for evenity. -Dr. Robes

## 2024-03-05 ENCOUNTER — Telehealth: Payer: Self-pay

## 2024-03-05 ENCOUNTER — Other Ambulatory Visit: Payer: Self-pay

## 2024-03-05 NOTE — Telephone Encounter (Signed)
 Pharmacy Patient Advocate Encounter   Received notification from Physician's Office that prior authorization for Tymlos  is required/requested.   Insurance verification completed.   The patient is insured through [employer].   Per test claim: PA required; PA submitted to above mentioned insurance via Latent Key/confirmation #/EOC [id number] Status is pending

## 2024-03-06 ENCOUNTER — Other Ambulatory Visit: Payer: Self-pay

## 2024-03-06 DIAGNOSIS — M85872 Other specified disorders of bone density and structure, left ankle and foot: Secondary | ICD-10-CM

## 2024-03-06 DIAGNOSIS — M8000XG Age-related osteoporosis with current pathological fracture, unspecified site, subsequent encounter for fracture with delayed healing: Secondary | ICD-10-CM

## 2024-03-09 ENCOUNTER — Other Ambulatory Visit: Payer: Self-pay

## 2024-03-10 ENCOUNTER — Other Ambulatory Visit: Payer: Self-pay | Admitting: *Deleted

## 2024-03-10 MED ORDER — TERIPARATIDE 560 MCG/2.24ML ~~LOC~~ SOPN: 20.0000 ug | PEN_INJECTOR | Freq: Every day | SUBCUTANEOUS | 12 refills | Status: AC

## 2024-03-14 ENCOUNTER — Telehealth: Payer: Self-pay

## 2024-03-14 ENCOUNTER — Ambulatory Visit

## 2024-03-14 ENCOUNTER — Other Ambulatory Visit: Payer: Self-pay

## 2024-03-14 DIAGNOSIS — Z8673 Personal history of transient ischemic attack (TIA), and cerebral infarction without residual deficits: Secondary | ICD-10-CM | POA: Diagnosis not present

## 2024-03-14 NOTE — Telephone Encounter (Signed)
 Pharmacy Patient Advocate Encounter   Received notification from Pt Calls Messages that prior authorization for Forteo  is required/requested.   Insurance verification completed.   The patient is insured through [employer].   Per test claim: PA required; PA submitted to above mentioned insurance via Latent Key/confirmation #/EOC [id number] Status is pending

## 2024-03-15 ENCOUNTER — Ambulatory Visit: Payer: Self-pay | Admitting: Cardiology

## 2024-03-15 ENCOUNTER — Encounter

## 2024-03-15 ENCOUNTER — Ambulatory Visit

## 2024-03-15 VITALS — BP 136/76 | Ht 63.0 in | Wt 130.0 lb

## 2024-03-15 DIAGNOSIS — Z0000 Encounter for general adult medical examination without abnormal findings: Secondary | ICD-10-CM

## 2024-03-15 LAB — CUP PACEART REMOTE DEVICE CHECK
Date Time Interrogation Session: 20260120231503
Implantable Pulse Generator Implant Date: 20220708

## 2024-03-15 NOTE — Progress Notes (Signed)
 " I connected with  [name] on 03/15/24 by a audio enabled telemedicine application and verified that I am speaking with the correct person using two identifiers.  Patient Location: Home  Provider Location: Office/Clinic  Persons Participating in Visit: Patient.  I discussed the limitations of evaluation and management by telemedicine. The patient expressed understanding and agreed to proceed.  Vital Signs: Because this visit was a virtual/telehealth visit, some criteria may be missing or patient reported. Any vitals not documented were not able to be obtained and vitals that have been documented are patient reported.  Chief Complaint  Patient presents with   Medicare Wellness     Subjective:   Claudia Rasmussen is a 89 y.o. female who presents for a Medicare Annual Wellness Visit.  Visit info / Clinical Intake: Medicare Wellness Visit Type:: Subsequent Annual Wellness Visit Persons participating in visit and providing information:: patient Medicare Wellness Visit Mode:: Telephone If telephone:: video declined Since this visit was completed virtually, some vitals may be partially provided or unavailable. Missing vitals are due to the limitations of the virtual format.: Documented vitals are patient reported If Telephone or Video please confirm:: I connected with patient using audio/video enable telemedicine. I verified patient identity with two identifiers, discussed telehealth limitations, and patient agreed to proceed. Patient Location:: home Provider Location:: office/clinic Interpreter Needed?: No Pre-visit prep was completed: yes AWV questionnaire completed by patient prior to visit?: yes Date:: 03/05/24 Living arrangements:: (!) (Patient-Rptd) lives alone; in retirement community Patient's Overall Health Status Rating: (Patient-Rptd) good Typical amount of pain: (Patient-Rptd) some Does pain affect daily life?: (Patient-Rptd) no  Dietary Habits and Nutritional  Risks How many meals a day?: (Patient-Rptd) 2 Eats fruit and vegetables daily?: (!) (Patient-Rptd) no Most meals are obtained by: (Patient-Rptd) having others provide food  Functional Status Activities of Daily Living (to include ambulation/medication): (Patient-Rptd) Independent Ambulation: (Patient-Rptd) Independent Medication Administration: (Patient-Rptd) Independent Home Management (perform basic housework or laundry): (Patient-Rptd) Independent Manage your own finances?: (Patient-Rptd) yes Primary transportation is: (Patient-Rptd) driving  Fall Screening Falls in the past year?: (Patient-Rptd) 0 Number of falls in past year: 0 Was there an injury with Fall?: 0 Fall Risk Category Calculator: 1 Patient Fall Risk Level: Low Fall Risk  Fall Risk Patient at Risk for Falls Due to: History of fall(s) Fall risk Follow up: Falls evaluation completed  Home and Transportation Safety: All rugs have non-skid backing?: (!) (Patient-Rptd) no All stairs or steps have railings?: (Patient-Rptd) N/A, no stairs Grab bars in the bathtub or shower?: (Patient-Rptd) yes Have non-skid surface in bathtub or shower?: (Patient-Rptd) yes Good home lighting?: (Patient-Rptd) yes Regular seat belt use?: (Patient-Rptd) yes Hospital stays in the last year:: (Patient-Rptd) no  Cognitive Assessment Difficulty concentrating, remembering, or making decisions? : (Patient-Rptd) no    Allergies (verified) Shellfish allergy, Nitrofuran derivatives, Azithromycin, Cephalosporins, Levofloxacin, Moxifloxacin hcl, and Phenobarbital   Current Medications (verified) Outpatient Encounter Medications as of 03/15/2024  Medication Sig   albuterol  (PROAIR  HFA) 108 (90 Base) MCG/ACT inhaler 2 puffs every 4 hours as needed only  if your can't catch your breath   atorvastatin  (LIPITOR) 40 MG tablet TAKE 1 TABLET DAILY   cetirizine  (ZYRTEC ) 10 MG tablet TAKE 1 TABLET AT BEDTIME   Cholecalciferol (VITAMIN D3) 25 MCG (1000  UT) CAPS Take 1,000 Units by mouth in the morning.   clopidogrel  (PLAVIX ) 75 MG tablet TAKE 1 TABLET DAILY   D-MANNOSE PO Take by mouth in the morning and at bedtime.   famotidine  (PEPCID ) 20  MG tablet One after supper   fluticasone  (FLONASE ) 50 MCG/ACT nasal spray USE 2 SPRAYS IN EACH NOSTRIL DAILY   furosemide (LASIX) 20 MG tablet Take by mouth.   levothyroxine  (SYNTHROID ) 88 MCG tablet TAKE 1 TABLET DAILY   MAGNESIUM PO Take by mouth 3 (three) times daily. Pt says she takes 1 cap in morning and 2 caps at night   Melatonin-Pyridoxine (MELATIN PO) Take by mouth.   nebivolol  (BYSTOLIC ) 2.5 MG tablet TAKE 1 TABLET(2.5 MG) BY MOUTH DAILY   pantoprazole  (PROTONIX ) 40 MG tablet Take 30-60 min before first meal of the day   Polyvinyl Alcohol-Povidone (REFRESH OP) Place 1 drop into both eyes in the morning and at bedtime.   Probiotic Product (PROBIOTIC-10 PO) Take 1 capsule by mouth daily.   sulfamethoxazole -trimethoprim  (BACTRIM ) 400-80 MG tablet Take 1 tablet by mouth 2 (two) times daily. 2 days left   Teriparatide  (FORTEO ) 560 MCG/2.24ML SOPN Inject 20 mcg into the skin daily.   trimethoprim  (TRIMPEX ) 100 MG tablet Take 100 mg by mouth daily.   nitroGLYCERIN  (NITROSTAT ) 0.4 MG SL tablet Place 1 tablet (0.4 mg total) under the tongue every 5 (five) minutes as needed for chest pain.   No facility-administered encounter medications on file as of 03/15/2024.    History: Past Medical History:  Diagnosis Date   Age-related osteoporosis without current pathological fracture    Arthritis    Atrophy of thyroid  (acquired)    Benign hypertension 09/09/2020   Blepharitis 2021   Cholelithiases    Clostridium difficile infection    Diverticulitis    Diverticulitis of large intestine without perforation or abscess without bleeding    Family history of colon cancer    Migraine without aura, not intractable, without status migrainosus    Mixed hyperlipidemia    Stroke [facility])    Thyroid  disease    UTI  (urinary tract infection)    Vaginal prolapse    Vitamin D  deficiency, unspecified    Past Surgical History:  Procedure Laterality Date   ABDOMINAL HYSTERECTOMY     APPENDECTOMY  1969   CATARACT EXTRACTION, BILATERAL Bilateral    right- 03/22/2018 and left 05/10/2018   COLONOSCOPY  05/12/2009   Colonic polyp status post polypectomy. Moderate predominanltly left colonic diverticulosis. Internal hemorrhoids   LEFT HEART CATH AND CORONARY ANGIOGRAPHY N/A 10/14/2020   Procedure: LEFT HEART CATH AND CORONARY ANGIOGRAPHY;  Surgeon: Dann Candyce RAMAN, MD;  Location: [facility] INVASIVE CV LAB;  Service: Cardiovascular;  Laterality: N/A;   LOOP RECORDER INSERTION N/A 08/29/2020   Procedure: LOOP RECORDER INSERTION;  Surgeon: Cindie Ole DASEN, MD;  Location: [facility] INVASIVE CV LAB;  Service: Cardiovascular;  Laterality: N/A;   MOHS SURGERY  2023   Forehead   OOPHORECTOMY Left    Family History  Problem Relation Age of Onset   Breast cancer Mother    Colon cancer Father    Prostate cancer Brother    Coronary artery disease Other    Hypertension Other    Esophageal cancer Neg Hx    Rectal cancer Neg Hx    Stomach cancer Neg Hx    Social History   Occupational History   Not on file  Tobacco Use   Smoking status: Former   Smokeless tobacco: Never  Vaping Use   Vaping status: Never Used  Substance and Sexual Activity   Alcohol use: Yes    Comment: Drinks alcohol very infrequently. She typically consumes white wine.   Drug use: Never   Sexual activity: Not  on file   Tobacco Counseling Counseling given: Not Answered  SDOH Screenings   Food Insecurity: No Food Insecurity (03/15/2024)  Housing: Low Risk (03/15/2024)  Transportation Needs: No Transportation Needs (03/15/2024)  Utilities: Not At Risk (03/15/2024)  Alcohol Screen: Low Risk (12/04/2023)  Depression (PHQ2-9): Low Risk (03/15/2024)  Financial Resource Strain: Low Risk (12/04/2023)  Physical Activity: Patient Declined (03/15/2024)   Social Connections: Moderately Integrated (03/15/2024)  Stress: No Stress Concern Present (03/15/2024)  Tobacco Use: Medium Risk (03/15/2024)  Health Literacy: Adequate Health Literacy (03/15/2024)   See flowsheets for full screening details  Depression Screen PHQ 2 & 9 Depression Scale- Over the past 2 weeks, how often have you been bothered by any of the following problems? Little interest or pleasure in doing things: 0 Feeling down, depressed, or hopeless (PHQ Adolescent also includes...irritable): 0 PHQ-2 Total Score: 0 Trouble falling or staying asleep, or sleeping too much: 0 Feeling tired or having little energy: 0 Poor appetite or overeating (PHQ Adolescent also includes...weight loss): 0 Feeling bad about yourself - or that you are a failure or have let yourself or your family down: 0 Trouble concentrating on things, such as reading the newspaper or watching television (PHQ Adolescent also includes...like school work): 0 Moving or speaking so slowly that other people could have noticed. Or the opposite - being so fidgety or restless that you have been moving around a lot more than usual: 0 Thoughts that you would be better off dead, or of hurting yourself in some way: 0 PHQ-9 Total Score: 0 If you checked off any problems, how difficult have these problems made it for you to do your work, take care of things at home, or get along with other people?: Not difficult at all  Depression Treatment Depression Interventions/Treatment : EYV7-0 Score <4 Follow-up Not Indicated     Goals Addressed               This Visit's Progress     get out the house (pt-stated)               Objective:    Today's Vitals   03/15/24 0912  BP: 136/76  Weight: 130 lb (59 kg)  Height: 5' 3 (1.6 m)   Body mass index is 23.03 kg/m.  Hearing/Vision screen Hearing Screening - Comments:: Wears hearing aids Vision Screening - Comments:: Wears glasses Immunizations and Health  Maintenance Health Maintenance  Topic Date Due   Medicare Annual Wellness (AWV)  12/27/2023   DTaP/Tdap/Td (1 - Tdap) 09/26/2024 (Originally 09/12/1950)   COVID-19 Vaccine (11 - Moderna risk 2025-26 season) 05/26/2024   Pneumococcal Vaccine: 50+ Years  Completed   Influenza Vaccine  Completed   Bone Density Scan  Completed   Meningococcal B Vaccine  Aged Out   Mammogram  Discontinued   Zoster Vaccines- Shingrix  Discontinued        Assessment/Plan:  This is a routine wellness examination for Claudia Rasmussen.  Patient Care Team: [name], [name] as PCP - General (Physician Assistant) Tobb, Kardie, DO as PCP - Cardiology (Cardiology)  I have personally reviewed and noted the following in the patients chart:   Medical and social history Use of alcohol, tobacco or illicit drugs  Current medications and supplements including opioid prescriptions. Functional ability and status Nutritional status Physical activity Advanced directives List of other physicians Hospitalizations, surgeries, and ER visits in previous 12 months Vitals Screenings to include cognitive, depression, and falls Referrals and appointments  No orders of the defined types  were placed in this encounter.  In addition, I have reviewed and discussed with patient certain preventive protocols, quality metrics, and best practice recommendations. A written personalized care plan for preventive services as well as general preventive health recommendations were provided to patient.   Claudia Rasmussen Right, [place]   03/15/2024   No follow-ups on file.  After Visit Summary: (MyChart) Due to this being a telephonic visit, the after visit summary with patients personalized plan was offered to patient via MyChart   Nurse Notes: no voiced concerns   "

## 2024-03-15 NOTE — Patient Instructions (Signed)
 Claudia Rasmussen,  Thank you for taking the time for your Medicare Wellness Visit. I appreciate your continued commitment to your health goals. Please review the care plan we discussed, and feel free to reach out if I can assist you further.  Please note that Annual Wellness Visits do not include a physical exam. Some assessments may be limited, especially if the visit was conducted virtually. If needed, we may recommend an in-person follow-up with your provider.  Ongoing Care Seeing your primary care provider every 3 to 6 months helps us  monitor your health and provide consistent, personalized care.   Referrals If a referral was made during today's visit and you haven't received any updates within two weeks, please contact the referred provider directly to check on the status.  Recommended Screenings:  Health Maintenance  Topic Date Due   Medicare Annual Wellness Visit  12/27/2023   DTaP/Tdap/Td vaccine (1 - Tdap) 09/26/2024*   COVID-19 Vaccine (11 - Moderna risk 2025-26 season) 05/26/2024   Pneumococcal Vaccine for age over 61  Completed   Flu Shot  Completed   Osteoporosis screening with Bone Density Scan  Completed   Meningitis B Vaccine  Aged Out   Breast Cancer Screening  Discontinued   Zoster (Shingles) Vaccine  Discontinued  *Topic was postponed. The date shown is not the original due date.       01/11/2022   11:50 AM  Advanced Directives  Does Patient Have a Medical Advance Directive? Yes  Does patient want to make changes to medical advance directive? No - Patient declined    Vision: Annual vision screenings are recommended for early detection of glaucoma, cataracts, and diabetic retinopathy. These exams can also reveal signs of chronic conditions such as diabetes and high blood pressure.  Dental: Annual dental screenings help detect early signs of oral cancer, gum disease, and other conditions linked to overall health, including heart disease and diabetes.  Please see  the attached documents for additional preventive care recommendations.

## 2024-03-16 ENCOUNTER — Other Ambulatory Visit: Payer: Self-pay

## 2024-03-16 NOTE — Progress Notes (Signed)
 Pharmacy Patient Advocate Encounter  Insurance verification completed.   The patient is insured through [employer] test claim for Forteo . Co-pay is $16.  This test claim was processed through [facility]- copay amounts may vary at other pharmacies due to pharmacy/plan contracts, or as the patient moves through the different stages of their insurance plan.

## 2024-03-16 NOTE — Telephone Encounter (Signed)
 Pharmacy Patient Advocate Encounter  Received notification from [employer] that Prior Authorization for Forteo  has been APPROVED from 02/14/24 to 03/15/26   PA #/Case ID/Reference #: [id number]

## 2024-03-17 NOTE — Progress Notes (Signed)
 Remote Loop Recorder Transmission

## 2024-03-26 ENCOUNTER — Other Ambulatory Visit: Payer: Self-pay

## 2024-03-26 ENCOUNTER — Ambulatory Visit

## 2024-03-29 ENCOUNTER — Telehealth: Payer: Self-pay

## 2024-03-29 NOTE — Telephone Encounter (Signed)
 Copied from CRM #[id number]. Topic: Clinical - Medication Question >> Mar 29, 2024 11:17 AM Macario HERO wrote: Reason for CRM: Patient called requesting a call back from her provider regarding a new medication that was prescribed by her sports doctor. Medication name is: Periparatide pen injection. Patient wants feedback from provider.

## 2024-03-29 NOTE — Telephone Encounter (Signed)
 Notify pt Forteo  is commonly prescribed for high risk postmenopausal osteoporosis  regardless of age It does help reduce fracture risk

## 2024-04-03 ENCOUNTER — Other Ambulatory Visit (HOSPITAL_BASED_OUTPATIENT_CLINIC_OR_DEPARTMENT_OTHER)

## 2024-04-05 ENCOUNTER — Other Ambulatory Visit (HOSPITAL_BASED_OUTPATIENT_CLINIC_OR_DEPARTMENT_OTHER)

## 2024-04-09 ENCOUNTER — Ambulatory Visit

## 2024-04-14 ENCOUNTER — Ambulatory Visit

## 2024-04-16 ENCOUNTER — Encounter

## 2024-05-15 ENCOUNTER — Ambulatory Visit

## 2024-05-17 ENCOUNTER — Encounter

## 2024-06-01 ENCOUNTER — Ambulatory Visit: Admitting: Physician Assistant

## 2024-06-07 ENCOUNTER — Ambulatory Visit: Admitting: Physician Assistant

## 2024-06-18 ENCOUNTER — Encounter

## 2024-07-19 ENCOUNTER — Encounter
